# Patient Record
Sex: Female | Born: 1995 | Race: White | Hispanic: No | Marital: Single | Smoking: Former smoker
Health system: Southern US, Community
[De-identification: ages and names within clinical notes are randomized; demographics above are authoritative.]

## PROBLEM LIST (undated history)

## (undated) ENCOUNTER — Inpatient Hospital Stay (HOSPITAL_COMMUNITY): Payer: Self-pay

## (undated) ENCOUNTER — Emergency Department (HOSPITAL_COMMUNITY): Admission: EM | Payer: Self-pay

## (undated) DIAGNOSIS — Z789 Other specified health status: Secondary | ICD-10-CM

## (undated) DIAGNOSIS — Q2112 Patent foramen ovale: Secondary | ICD-10-CM

## (undated) DIAGNOSIS — E538 Deficiency of other specified B group vitamins: Secondary | ICD-10-CM

## (undated) DIAGNOSIS — O139 Gestational [pregnancy-induced] hypertension without significant proteinuria, unspecified trimester: Secondary | ICD-10-CM

## (undated) DIAGNOSIS — R51 Headache: Secondary | ICD-10-CM

## (undated) DIAGNOSIS — R519 Headache, unspecified: Secondary | ICD-10-CM

## (undated) HISTORY — DX: Patent foramen ovale: Q21.12

## (undated) HISTORY — DX: Deficiency of other specified B group vitamins: E53.8

## (undated) HISTORY — DX: Gestational (pregnancy-induced) hypertension without significant proteinuria, unspecified trimester: O13.9

---

## 2013-02-03 ENCOUNTER — Emergency Department (HOSPITAL_BASED_OUTPATIENT_CLINIC_OR_DEPARTMENT_OTHER)
Admission: EM | Admit: 2013-02-03 | Discharge: 2013-02-03 | Disposition: A | Discharge status: Home / Self Care | Payer: Medicaid Other | Attending: Emergency Medicine | Admitting: Emergency Medicine

## 2013-02-03 ENCOUNTER — Encounter (HOSPITAL_BASED_OUTPATIENT_CLINIC_OR_DEPARTMENT_OTHER): Payer: Self-pay | Admitting: Emergency Medicine

## 2013-02-03 DIAGNOSIS — Z79899 Other long term (current) drug therapy: Secondary | ICD-10-CM | POA: Insufficient documentation

## 2013-02-03 DIAGNOSIS — Z3202 Encounter for pregnancy test, result negative: Secondary | ICD-10-CM | POA: Insufficient documentation

## 2013-02-03 DIAGNOSIS — O9989 Other specified diseases and conditions complicating pregnancy, childbirth and the puerperium: Secondary | ICD-10-CM | POA: Insufficient documentation

## 2013-02-03 DIAGNOSIS — Z3201 Encounter for pregnancy test, result positive: Secondary | ICD-10-CM

## 2013-02-03 DIAGNOSIS — R110 Nausea: Secondary | ICD-10-CM | POA: Insufficient documentation

## 2013-02-03 LAB — PREGNANCY, URINE: Preg Test, Ur: POSITIVE — AB

## 2013-02-03 MED ORDER — FOLIC ACID 1 MG PO TABS: 1.0000 mg | ORAL_TABLET | Freq: Every day | ORAL | Status: DC

## 2013-02-03 MED ORDER — PRENATAL VITAMINS 0.8 MG PO TABS: 1.0000 | ORAL_TABLET | Freq: Every day | ORAL | Status: DC

## 2013-02-03 NOTE — ED Notes (Signed)
Pt sts that sh is "going to get pregnancy medicaid and I need proof that I am pregnant.  I go in the morning."  Not having any problems.

## 2013-02-03 NOTE — Discharge Instructions (Signed)
RESOURCE GUIDE  Chronic Pain Problems: Contact [facility]  [phone] Patients need to be referred by their primary care doctor.  Insufficient Money for Medicine: Contact United Way:  call "211."   No Primary Care Doctor: - Call Health Connect  [phone] - can help you locate a primary care doctor that  accepts your insurance, provides certain services, etc. - Physician Referral Service- [phone]  Agencies that provide inexpensive medical care: - [facility]  [facility]  [phone] - [facility]  [phone] - [facility]  [phone] - Planned Parenthood  [facility]  [phone]  Silex Providers: - [facility]- [address]  [phone], Mon-Fri 9am-7pm, Sat 9am-1pm - [facility]- [place], [address] [place]  [place], [address] [place]  [place]- [address]  [place], [address], [phone]  Only accepts [place] patients after they have their name  applied to their card  Self Pay (no insurance) in [place]: - Sickle Cell Patients: Dr Kevan Ny, [facility]  [place], [facility]- [place]  [facility]- [id number] [place], [facility]- see information above (Speak to [employer] if you do not have insurance)       -  [facility]- [place],  [place], [place]  Dr Vista Lawman-  [address], [place], [place], [place]       -  Urgent Medical and [facility] - [address] [phone]       -  [facility]- [address], [place],  also [address], [phone]       -    [facility]- [place], [place], 1st & 3rd Saturday        every month, 10am-1pm  [facility] [place], [place] [address] [phone]  [facility] [place]. [place], [place] [address] [phone]  1) Find a Doctor and Pay Out of Pocket Although you won't have to find out who is covered by your insurance plan, it is a good idea to ask around and get recommendations. You will then need to call the office and see if the doctor you have chosen will accept you as a new patient and what types of options they offer for patients who are self-pay. Some doctors offer discounts or will set up payment plans for their patients who do not have insurance, but you will need to ask so you aren't surprised when you get to your appointment.  2) Contact Your Local Health Department Not all health departments have doctors that can see patients for sick visits, but many do, so it is worth a call to see if yours does. If you don't know where your local health department is, you can check in your phone book. The CDC also has a tool  to help you locate your state's health department, and many state websites also have listings of all of their local health departments.  3) Find a Walk-in Clinic If your illness is not likely to be very severe or complicated, you may want to try a walk in clinic. These are popping up all over the country in pharmacies, drugstores, and shopping centers. They're usually staffed by nurse practitioners or physician assistants that have been trained to treat common illnesses and complaints. They're usually fairly quick and inexpensive. However, if you have serious medical issues or chronic medical problems, these are probably not your best option  STD Testing - [facility] Department of [facility] [place], STD Clinic, [address], [place],  phone [phone] or [phone].  Monday - Friday, call for an appointment. [facility] Department of [employer], STD Clinic, [place] [address], [place], phone [phone] or [phone].  Monday - Friday, call for an appointment.  Abuse/Neglect: [facility] Child Abuse Hotline [phone] [facility] Child Abuse Hotline [phone] (After Hours)  Emergency Shelter:  [facility] [phone]  Maternity Homes: - Room at the [place] [facility] [phone] - [facility] [phone]  MRSA Hotline #:   505-452-1969  Dental Assistance If unable to pay or uninsured, contact:  [facility]. to become qualified for the adult dental clinic.  Patients with Medicaid: [facility] [phone] [address], [phone] [address] [phone]  If unable to pay, or uninsured, contact [facility] [phone] in [place], [phone] in [facility]) to become qualified for the adult dental clinic  [facility] [address] [place], [place] [address] [phone] [email or web address]  Other Proofreader Services: - [facility]- [address] [place], [place], [place], [address], [phone], Ext. [phone], 2nd and 4th Thursday of the month at 6:30am.  10 clients each day by appointment, can sometimes see walk-in patients if someone does not show for an appointment. [facility]- [address] [place], [place], [address], [phone] - [facility]- [address] [place], [place], [address], [phone] [facility] Health Department- [phone] [facility] Health Department- [phone] [facility] [phone]          Prenatal Care  WHAT IS PRENATAL CARE?  Prenatal care means health care during your pregnancy, before your baby is born. It is very important to take care of yourself and your baby  during your pregnancy by:   Getting early prenatal care. If you know you are pregnant, or think you might be pregnant, call your health care provider as soon as possible. Schedule a visit for a prenatal exam.  Getting regular prenatal care. Follow your health care provider's schedule for blood and other necessary tests. Do not miss appointments.  Doing everything you can to keep yourself and your baby healthy during your pregnancy.  Getting complete care. Prenatal care should include evaluation of the medical, dietary, educational, psychological, and social needs of you and your significant other. The medical and genetic history of your family and the family of your baby's father should be discussed with your health care provider.  Discussing with your health care provider:  Prescription, over-the-counter, and herbal medicines that you take.  Any history of substance abuse, alcohol use, smoking, and illegal drug use.  Any history of domestic abuse and violence.  Immunizations you have received.  Your nutrition and diet.  The amount of exercise you do.  Any environmental and occupational hazards to which you are exposed.  History of sexually transmitted infections for both you and your partner.  Previous pregnancies you have had. WHY IS PRENATAL CARE SO IMPORTANT?  By regularly seeing your health care provider, you help ensure that problems can be identified early so that they can be treated as soon as possible. Other problems might be prevented. Many studies have shown that early and regular prenatal care is important for the health of mothers and their babies.  HOW CAN I TAKE CARE OF MYSELF WHILE I AM PREGNANT?  Here are ways to take care of yourself and your baby:   Start or continue taking your multivitamin with 400 micrograms (mcg) of folic acid every day.  Get early and regular prenatal care. It is very important to see a health care provider during your pregnancy. Your  health care provider will check at each visit to make sure that you and the baby are healthy. If there are any problems, action can be taken right away to help you and the baby.  Eat a healthy diet that includes:  Fruits.  Vegetables.  Foods low in saturated fat.  Whole grains.  Calcium-rich foods, such as milk, yogurt, and hard cheeses.  Drink 6 to 8 glasses of liquids a day.  Unless your health care provider tells you not to, try to be physically active for 30 minutes, most days of the week. If you are pressed for time, you can get your activity in through 10-minute segments, three times a day.  Do not smoke, drink alcohol, or use drugs. These can cause long-term damage to your baby. Talk with your health care provider about steps to take to stop smoking. Talk with a member of your faith community, a counselor, a trusted friend, or your health care provider if you are concerned about your alcohol or drug use.  Ask your health care provider before taking any medicine, even over-the-counter medicines. Some medicines are not safe to take during pregnancy.  Get plenty of rest and sleep.  Avoid hot tubs and saunas during pregnancy.  Do not have X-rays taken unless absolutely necessary and with the recommendation of your health care provider. A lead shield can be placed on your abdomen to protect the baby when X-rays are taken in other parts of the body.  Do not empty the cat litter when you are pregnant. It may contain a parasite that causes an infection called toxoplasmosis, which can cause birth defects. Also, use gloves when working in garden areas used by cats.  Do not eat uncooked or undercooked meats or fish.  Do not eat soft, mold-ripened cheeses (Brie, Camembert, and chevre) or soft, blue-veined cheese (Danish blue and Roquefort).  Stay away from toxic chemicals like:  Insecticides.  Solvents (some cleaners or paint thinners).  Lead.  Mercury.  Sexual intercourse may  continue until the end of the pregnancy, unless you have a medical problem or there is a problem with the pregnancy and your health care provider tells you not to.  Do not wear high-heel shoes, especially during the second half of the pregnancy. You can lose your balance and fall.  Do not take long trips, unless absolutely necessary. Be sure to see your health care provider before going on the trip.  Do not sit in one position for more than 2 hours when on a trip.  Take a copy of  your medical records when going on a trip. Know where a hospital is located in the city you are visiting, in case of an emergency.  Most dangerous household products will have pregnancy warnings on their labels. Ask your health care provider about products if you are unsure.  Limit or eliminate your caffeine intake from coffee, tea, sodas, medicines, and chocolate.  Many women continue working through pregnancy. Staying active might help you stay healthier. If you have a question about the safety or the hours you work at your particular job, talk with your health care provider.  Get informed:  Read books.  Watch videos.  Go to childbirth classes for you and your significant other.  Talk with experienced moms.  Ask your health care provider about childbirth education classes for you and your partner. Classes can help you and your partner prepare for the birth of your baby.  Ask about a baby doctor (pediatrician) and methods and pain medicine for labor, delivery, and possible cesarean delivery. HOW OFTEN SHOULD I SEE MY HEALTH CARE PROVIDER DURING PREGNANCY?  Your health care provider will give you a schedule for your prenatal visits. You will have visits more often as you get closer to the end of your pregnancy. An average pregnancy lasts about 40 weeks.  A typical schedule includes visiting your health care provider:   About once each month during your first 6 months of pregnancy.  Every 2 weeks during the  next 2 months.  Weekly in the last month, until the delivery date. Your health care provider will probably want to see you more often if:  You are older than 35 years.  Your pregnancy is high risk because you have certain health problems or problems with the pregnancy, such as:  Diabetes.  High blood pressure.  The baby is not growing on schedule, according to the dates of the pregnancy. Your health care provider will do special tests to make sure you and the baby are not having any serious problems. WHAT HAPPENS DURING PRENATAL VISITS?   At your first prenatal visit, your health care provider will do a physical exam and talk to you about your health history and the health history of your partner and your family. Your health care provider will be able to tell you what date to expect your baby to be born on.  Your first physical exam will include checks of your blood pressure, measurements of your height and weight, and an exam of your pelvic organs. Your health care provider will do a Pap test if you have not had one recently and will do cultures of your cervix to make sure there is no infection.  At each prenatal visit, there will be tests of your blood, urine, blood pressure, weight, and checking the progress of the baby.  At your later prenatal visits, your health care provider will check how you are doing and how the baby is developing. You may have a number of tests done as your pregnancy progresses.  Ultrasound exams are often used to check on the baby's growth and health.  You may have more urine and blood tests, as well as special tests, if needed. These may include amniocentesis to examine fluid in the pregnancy sac, stress tests to check how the baby responds to contractions, or a biophysical profile to measure fetus well-being. Your health care provider will explain the tests and why they are necessary.  You should discuss with your health care provider your plans to  breastfeed or bottle-feed your baby.  Each visit is also a chance for you to learn about staying healthy during pregnancy and to ask questions. Document Released: 01/10/2003 Document Revised: 10/28/2012 Document Reviewed: 06/25/2012 [facility] Patient Information 2014 [place], [place]. Pregnancy If you are planning on getting pregnant, it is a good idea to make a preconception appointment with your caregiver to discuss having a healthy lifestyle before getting pregnant. This includes diet, weight, exercise, taking prenatal vitamins (especially folic acid, which helps prevent brain and spinal cord defects), avoiding alcohol, smoking and illegal drugs, medical problems (diabetes, convulsions), family history of genetic problems, working conditions, and immunizations. It is better to have knowledge of these things and do something about them before getting pregnant. During your pregnancy, it is important to follow certain guidelines in order to have a healthy baby. It is very important to get good prenatal care and follow your caregiver's instructions. Prenatal care includes all the medical care you receive before your baby's birth. This helps to prevent problems during the pregnancy and childbirth. HOME CARE INSTRUCTIONS  Start your prenatal visits by the 12th week of pregnancy or earlier, if possible. At first, appointments are usually scheduled monthly. They become more frequent in the last 2 months before delivery. It is important that you keep your caregiver's appointments and follow your caregiver's instructions regarding medication use, exercise, and diet. During pregnancy, you are providing food for you and your baby. Eat a regular, well-balanced diet. Choose foods such as meat, fish, milk and other dairy products, vegetables, fruits, whole-grain breads and cereals. Your caregiver will inform you of the ideal weight gain depending on your current height and weight. Drink lots of liquids. Try to drink 8  glasses of water a day. Alcohol is associated with a number of birth defects including fetal alcohol syndrome. It is best to avoid alcohol completely. Smoking will cause low birth rate and prematurity. Use of alcohol and nicotine during your pregnancy also increases the chances that your child will be chemically dependent later in their life and may contribute to SIDS (Sudden Infant Death Syndrome). Do not use illegal drugs. Only take prescription or over-the-counter medications that are recommended by your caregiver. Other medications can cause genetic and physical problems in the baby. Morning sickness can often be helped by keeping soda crackers at the bedside. Eat a few before getting up in the morning. A sexual relationship may be continued until near the end of pregnancy if there are no other problems such as early (premature) leaking of amniotic fluid from the membranes, vaginal bleeding, painful intercourse or belly (abdominal) pain. Exercise regularly. Check with your caregiver if you are unsure of the safety of some of your exercises. Do not use hot tubs, steam rooms or saunas. These increase the risk of fainting and hurting yourself and the baby. Swimming is OK for exercise. Get plenty of rest, including afternoon naps when possible, especially in the third trimester. Avoid toxic odors and chemicals. Do not wear high heels. They may cause you to lose your balance and fall. Do not lift over 5 pounds. If you do lift anything, lift with your legs and thighs, not your back. Avoid long trips, especially in the third trimester. If you have to travel out of the city or state, take a copy of your medical records with you. SEEK IMMEDIATE MEDICAL CARE IF:  You develop an unexplained oral temperature above 102 F (38.9 C), or as your caregiver suggests. You have leaking of fluid from  the vagina. If leaking membranes are suspected, take your temperature and inform your caregiver of this when you  call. There is vaginal spotting or bleeding. Notify your caregiver of the amount and how many pads are used. You continue to feel sick to your stomach (nauseous) and have no relief from remedies suggested, or you throw up (vomit) blood or coffee ground like materials. You develop upper abdominal pain. You have round ligament discomfort in the lower abdominal area. This still must be evaluated by your caregiver. You feel contractions of the uterus. You do not feel the baby move, or there is less movement than before. You have painful urination. You have abnormal vaginal discharge. You have persistent diarrhea. You get a severe headache. You have problems with your vision. You develop muscle weakness. You feel dizzy and faint. You develop shortness of breath. You develop chest pain. You have back pain that travels down to your leg and feet. You feel irregular or a very fast heartbeat. You develop excessive weight gain in a short period of time (5 pounds in 3 to 5 days). You are involved in a domestic violence situation. Document Released: 01/07/2005 Document Revised: 07/09/2011 Document Reviewed: 07/01/2008 [facility] Patient Information 2014 [employer].

## 2013-02-03 NOTE — ED Provider Notes (Signed)
CSN: [id number]     Arrival date & time 02/03/13  1411 History   First MD Initiated Contact with Patient 02/03/13 1546     Chief Complaint  Patient presents with  . Possible Pregnancy   (Consider location/radiation/quality/duration/timing/severity/associated sxs/prior Treatment) HPI Comments: Pt comes in to confirm pregnancy status, mainly since she needs hospital evidence to get medicaid. MNP was in October. Pt has no abd pain, uri like sx, vaginal discharge or bleeding. She has not been pregnant in the past.  Patient is a 18 y.o. female presenting with pregnancy problem. The history is provided by the patient.  Possible Pregnancy Patient reports no abdominal pain, no vaginal bleeding and no vaginal discharge.  Associated symptoms include nausea.  Associated symptoms include no dysuria and no vomiting.    History reviewed. No pertinent past medical history. History reviewed. No pertinent past surgical history. No family history on file. History  Substance Use Topics  . Smoking status: Never Smoker   . Smokeless tobacco: Not on file  . Alcohol Use: No   OB History   Grav Para Term Preterm Abortions TAB SAB Ect Mult Living                 Review of Systems  Constitutional: Negative for activity change.  Gastrointestinal: Positive for nausea. Negative for vomiting and abdominal pain.  Genitourinary: Negative for dysuria, vaginal bleeding and vaginal discharge.  Neurological: Negative for dizziness.    Allergies  Sulfa antibiotics  Home Medications   Current Outpatient Rx  Name  Route  Sig  Dispense  Refill  . folic acid (FOLVITE) 1 MG tablet   Oral   Take 1 tablet (1 mg total) by mouth daily.   90 tablet   3   . Prenatal Multivit-Min-Fe-FA (PRENATAL VITAMINS) 0.8 MG tablet   Oral   Take 1 tablet by mouth daily.   90 tablet   3    BP 117/60  Pulse 71  Temp(Src) 98.3 F (36.8 C) (Oral)  Resp 18  Ht 5\' 2"  (1.575 m)  Wt 140 lb (63.504 kg)  BMI 25.60 kg/m2   SpO2 100%  LMP 10/21/2012 Physical Exam  Nursing note and vitals reviewed. Constitutional: She appears well-developed.  Eyes: Conjunctivae are normal.  Neck: Neck supple.  Cardiovascular: Normal rate.   Pulmonary/Chest: Effort normal.  Abdominal: She exhibits no distension. There is no tenderness. There is no rebound and no guarding.    ED Course  Procedures (including critical care time) Labs Review Labs Reviewed  PREGNANCY, URINE - Abnormal; Notable for the following:    Preg Test, Ur POSITIVE (*)    All other components within normal limits   Imaging Review No results found.  EKG Interpretation   None       MDM   1. Positive urine pregnancy test    Pt comes in for pregnancy check up. UCG is + No abd pain, vaginal bleeding. No need for further ED workup. Directed to Health department.  Derwood KaplanAnkit Maymie Brunke, MD 02/03/13 1610

## 2013-02-09 ENCOUNTER — Emergency Department (HOSPITAL_COMMUNITY): Payer: Medicaid Other

## 2013-02-09 ENCOUNTER — Emergency Department (HOSPITAL_COMMUNITY)
Admission: EM | Admit: 2013-02-09 | Discharge: 2013-02-09 | Disposition: A | Discharge status: Home / Self Care | Payer: Medicaid Other | Attending: Emergency Medicine | Admitting: Emergency Medicine

## 2013-02-09 ENCOUNTER — Encounter (HOSPITAL_COMMUNITY): Payer: Self-pay | Admitting: Emergency Medicine

## 2013-02-09 DIAGNOSIS — O9989 Other specified diseases and conditions complicating pregnancy, childbirth and the puerperium: Secondary | ICD-10-CM | POA: Insufficient documentation

## 2013-02-09 DIAGNOSIS — N912 Amenorrhea, unspecified: Secondary | ICD-10-CM | POA: Insufficient documentation

## 2013-02-09 DIAGNOSIS — O219 Vomiting of pregnancy, unspecified: Secondary | ICD-10-CM

## 2013-02-09 DIAGNOSIS — R61 Generalized hyperhidrosis: Secondary | ICD-10-CM | POA: Insufficient documentation

## 2013-02-09 DIAGNOSIS — R231 Pallor: Secondary | ICD-10-CM | POA: Insufficient documentation

## 2013-02-09 DIAGNOSIS — R1032 Left lower quadrant pain: Secondary | ICD-10-CM | POA: Insufficient documentation

## 2013-02-09 DIAGNOSIS — O210 Mild hyperemesis gravidarum: Secondary | ICD-10-CM | POA: Insufficient documentation

## 2013-02-09 DIAGNOSIS — Z3490 Encounter for supervision of normal pregnancy, unspecified, unspecified trimester: Secondary | ICD-10-CM

## 2013-02-09 DIAGNOSIS — R42 Dizziness and giddiness: Secondary | ICD-10-CM | POA: Insufficient documentation

## 2013-02-09 LAB — URINALYSIS, ROUTINE W REFLEX MICROSCOPIC
BILIRUBIN URINE: NEGATIVE
Glucose, UA: NEGATIVE mg/dL
HGB URINE DIPSTICK: NEGATIVE
KETONES UR: NEGATIVE mg/dL
NITRITE: NEGATIVE
PH: 6 (ref 5.0–8.0)
Protein, ur: NEGATIVE mg/dL
Specific Gravity, Urine: 1.031 — ABNORMAL HIGH (ref 1.005–1.030)
Urobilinogen, UA: 0.2 mg/dL (ref 0.0–1.0)

## 2013-02-09 LAB — CBC
HEMATOCRIT: 34.7 % — AB (ref 36.0–46.0)
HEMOGLOBIN: 11.9 g/dL — AB (ref 12.0–15.0)
MCH: 30.4 pg (ref 26.0–34.0)
MCHC: 34.3 g/dL (ref 30.0–36.0)
MCV: 88.7 fL (ref 78.0–100.0)
Platelets: 250 10*3/uL (ref 150–400)
RBC: 3.91 MIL/uL (ref 3.87–5.11)
RDW: 12.3 % (ref 11.5–15.5)
WBC: 6.4 10*3/uL (ref 4.0–10.5)

## 2013-02-09 LAB — BASIC METABOLIC PANEL
BUN: 9 mg/dL (ref 6–23)
CO2: 21 mEq/L (ref 19–32)
CREATININE: 0.64 mg/dL (ref 0.50–1.10)
Calcium: 9.3 mg/dL (ref 8.4–10.5)
Chloride: 103 mEq/L (ref 96–112)
GFR calc Af Amer: >90 mL/min (ref >90)
GFR calc non Af Amer: >90 mL/min (ref >90)
GLUCOSE: 101 mg/dL — AB (ref 70–99)
Potassium: 4.1 mEq/L (ref 3.7–5.3)
Sodium: 137 mEq/L (ref 137–147)

## 2013-02-09 LAB — URINE MICROSCOPIC-ADD ON

## 2013-02-09 LAB — WET PREP, GENITAL
TRICH WET PREP: NONE SEEN
YEAST WET PREP: NONE SEEN

## 2013-02-09 LAB — POCT PREGNANCY, URINE: Preg Test, Ur: POSITIVE — AB

## 2013-02-09 LAB — HCG, QUANTITATIVE, PREGNANCY: HCG, BETA CHAIN, QUANT, S: 26374 m[IU]/mL — AB (ref <5)

## 2013-02-09 MED ORDER — ONDANSETRON HCL 4 MG/2ML IJ SOLN
4.0000 mg | Freq: Once | INTRAMUSCULAR | Status: AC
  Administered 2013-02-09: 4 mg via INTRAVENOUS
  Filled 2013-02-09: qty 2

## 2013-02-09 MED ORDER — SODIUM CHLORIDE 0.9 % IV BOLUS (SEPSIS)
1000.0000 mL | Freq: Once | INTRAVENOUS | Status: AC
  Administered 2013-02-09: 1000 mL via INTRAVENOUS

## 2013-02-09 MED ORDER — ONDANSETRON 4 MG PO TBDP: 4.0000 mg | ORAL_TABLET | Freq: Three times a day (TID) | ORAL | Status: DC | PRN

## 2013-02-09 NOTE — ED Provider Notes (Signed)
Medical screening examination/treatment/procedure(s) were performed by non-physician practitioner and as supervising physician I was immediately available for consultation/collaboration.  EKG Interpretation    Date/Time:  Tuesday February 09 2013 10:13:47 EST Ventricular Rate:  77 PR Interval:  126 QRS Duration: 96 QT Interval:  368 QTC Calculation: 416 R Axis:   84 Text Interpretation:  Normal sinus rhythm with sinus arrhythmia RSR prime Borderline ECG Confirmed by Drey Shaff  DO, Lolitha Tortora ([id number]) on 02/09/2013 12:22:43 PM              Layla MawKristen N Brittanny Levenhagen, DO 02/09/13 1936

## 2013-02-09 NOTE — ED Notes (Signed)
Pt had two episodes of syncope yesterday.  Pt has been vomiting continuously since October.  Pt has not had menstrual since September.  She had negative pregnancy tests in oct, nov, and dec.  Pt then had positive pregnancy test in Jan.  Pt is now having lower abdominal pain that shoots to left side.  G-1, P-0 A-0.  Pt concerned she has no idea how far along she is and concerned for ectopic pregnancy

## 2013-02-09 NOTE — ED Notes (Signed)
Pt reports constant N/V since September with intermittent HA's. States she has been seen multiple times, but has no dx but continuation of symptoms. Pt also reports LMP beginning of September, states they are usually regular. First positive pregnancy test in January. Now reports lower quadrant pain, denies bleeding or discharge. Pain on palpation. Denies urinary sym, diarrhea.

## 2013-02-09 NOTE — ED Provider Notes (Signed)
CSN: [id number]     Arrival date & time 02/09/13  1005 History   First MD Initiated Contact with Patient 02/09/13 1108     Chief Complaint  Patient presents with  . Loss of Consciousness  . Emesis  . Abdominal Pain   (Consider location/radiation/quality/duration/timing/severity/associated sxs/prior Treatment) HPI Comments: Patient who is G1 P0 with history of intermittent nausea and vomiting for the past 4 months, amenorrhea for 4 months -- presents with positive pregnancy test, persistent vomiting, and lower abdominal and left lower quadrant pain for the past 2 days. Patient states that yesterday she had 2 episodes of near-syncope. The first was associated with lightheadedness, warm feeling, sweating, pallor after she stood up from resting. Patient felt better with time and went out to the grocery store when she had another similar episode. She currently denies any vaginal bleeding or vaginal discharge. She denies  fever, URI symptoms, chest pain, shortness of breath, back pain, constipation, diarrhea, urinary symptoms. Patient searched for her symptoms on the Internet and she is concerned of ectopic pregnancy. Course is intermittent. Onset acute. Nothing makes symptoms better or worse. No treatments prior to arrival.  The history is provided by the patient.    History reviewed. No pertinent past medical history. History reviewed. No pertinent past surgical history. No family history on file. History  Substance Use Topics  . Smoking status: Never Smoker   . Smokeless tobacco: Not on file  . Alcohol Use: No   OB History   Grav Para Term Preterm Abortions TAB SAB Ect Mult Living                 Review of Systems  Constitutional: Negative for fever.  HENT: Negative for rhinorrhea and sore throat.   Eyes: Negative for redness.  Respiratory: Negative for cough.   Cardiovascular: Negative for chest pain.  Gastrointestinal: Positive for nausea, vomiting and abdominal pain. Negative for  diarrhea.  Genitourinary: Positive for menstrual problem. Negative for dysuria, vaginal bleeding, vaginal discharge and vaginal pain.  Musculoskeletal: Negative for myalgias.  Skin: Negative for rash.  Neurological: Positive for light-headedness. Negative for headaches.    Allergies  Sulfa antibiotics  Home Medications   Current Outpatient Rx  Name  Route  Sig  Dispense  Refill  . folic acid (FOLVITE) 1 MG tablet   Oral   Take 1 tablet (1 mg total) by mouth daily.   90 tablet   3   . Prenatal Multivit-Min-Fe-FA (PRENATAL VITAMINS) 0.8 MG tablet   Oral   Take 1 tablet by mouth daily.   90 tablet   3    BP 126/77  Pulse 69  Temp(Src) 98.1 F (36.7 C) (Oral)  Resp 16  SpO2 98%  LMP 10/21/2012 Physical Exam  Nursing note and vitals reviewed. Constitutional: She appears well-developed and well-nourished.  HENT:  Head: Normocephalic and atraumatic.  Eyes: Conjunctivae are normal. Right eye exhibits no discharge. Left eye exhibits no discharge.  Neck: Normal range of motion. Neck supple.  Cardiovascular: Normal rate, regular rhythm and normal heart sounds.   Pulmonary/Chest: Effort normal and breath sounds normal.  Abdominal: Soft. There is no tenderness.  Genitourinary: There is no rash on the right labia. There is no rash on the left labia. Uterus is not tender. Cervix exhibits friability. Cervix exhibits no motion tenderness and no discharge. Right adnexum displays no mass, no tenderness and no fullness. Left adnexum displays no mass, no tenderness and no fullness. No erythema, tenderness or bleeding around the  vagina. No vaginal discharge found.  Neurological: She is alert.  Skin: Skin is warm and dry.  Psychiatric: She has a normal mood and affect.    ED Course  Procedures (including critical care time) Labs Review Labs Reviewed  WET PREP, GENITAL - Abnormal; Notable for the following:    Clue Cells Wet Prep HPF POC MODERATE (*)    WBC, Wet Prep HPF POC MANY (*)     All other components within normal limits  CBC - Abnormal; Notable for the following:    Hemoglobin 11.9 (*)    HCT 34.7 (*)    All other components within normal limits  BASIC METABOLIC PANEL - Abnormal; Notable for the following:    Glucose, Bld 101 (*)    All other components within normal limits  URINALYSIS, ROUTINE W REFLEX MICROSCOPIC - Abnormal; Notable for the following:    APPearance HAZY (*)    Specific Gravity, Urine 1.031 (*)    Leukocytes, UA TRACE (*)    All other components within normal limits  URINE MICROSCOPIC-ADD ON - Abnormal; Notable for the following:    Squamous Epithelial / LPF FEW (*)    Bacteria, UA FEW (*)    All other components within normal limits  POCT PREGNANCY, URINE - Abnormal; Notable for the following:    Preg Test, Ur POSITIVE (*)    All other components within normal limits  GC/CHLAMYDIA PROBE AMP  HCG, QUANTITATIVE, PREGNANCY   Imaging Review [place] Ob Comp Less 14 Wks  02/09/2013   CLINICAL DATA:  Positive pregnancy test and left-sided pain.  EXAM: TRANSVAGINAL OB ULTRASOUND; OBSTETRIC <14 WK ULTRASOUND  TECHNIQUE: Transvaginal ultrasound was performed for complete evaluation of the gestation as well as the maternal uterus, adnexal regions, and pelvic cul-de-sac.  COMPARISON:  None.  FINDINGS: Intrauterine gestational sac: Visualized/normal in shape.  Yolk sac:  Visualized  Embryo:  Visualized  Cardiac Activity: Visualized  Heart Rate: 119 bpm  CRL:   4.1  mm   6 w 1d                  US EDC: 10/04/2013  Maternal uterus/adnexae: No subchorionic hemorrhage is visualized. Both the right and left maternal ovaries have normal appearances. The left ovary measures 3.1 x 2.1 x 2.7 cm. The right ovary measures 2.7 x 1.4 x 2.5 cm. No adnexal mass is seen. No free pelvic fluid.  IMPRESSION: Single living intrauterine pregnancy. Estimated gestational age by crown rump length is 6 weeks 1 day with an EDC of 10/04/2013.  Ultrasound for fetal anatomic evaluation at  18-[redacted] weeks gestational age is recommended.   Electronically Signed   By: Britta MccreedySusan  Turner M.D.   On: 02/09/2013 14:02   [place] Ob Transvaginal  02/09/2013   CLINICAL DATA:  Positive pregnancy test and left-sided pain.  EXAM: TRANSVAGINAL OB ULTRASOUND; OBSTETRIC <14 WK ULTRASOUND  TECHNIQUE: Transvaginal ultrasound was performed for complete evaluation of the gestation as well as the maternal uterus, adnexal regions, and pelvic cul-de-sac.  COMPARISON:  None.  FINDINGS: Intrauterine gestational sac: Visualized/normal in shape.  Yolk sac:  Visualized  Embryo:  Visualized  Cardiac Activity: Visualized  Heart Rate: 119 bpm  CRL:   4.1  mm   6 w 1d                  US EDC: 10/04/2013  Maternal uterus/adnexae: No subchorionic hemorrhage is visualized. Both the right and left maternal ovaries have normal appearances. The left ovary measures  3.1 x 2.1 x 2.7 cm. The right ovary measures 2.7 x 1.4 x 2.5 cm. No adnexal mass is seen. No free pelvic fluid.  IMPRESSION: Single living intrauterine pregnancy. Estimated gestational age by crown rump length is 6 weeks 1 day with an EDC of 10/04/2013.  Ultrasound for fetal anatomic evaluation at 18-[redacted] weeks gestational age is recommended.   Electronically Signed   By: Britta Mccreedy M.D.   On: 02/09/2013 14:02    EKG Interpretation    Date/Time:  Tuesday February 09 2013 10:13:47 EST Ventricular Rate:  77 PR Interval:  126 QRS Duration: 96 QT Interval:  368 QTC Calculation: 416 R Axis:   84 Text Interpretation:  Normal sinus rhythm with sinus arrhythmia RSR prime Borderline ECG Confirmed by WARD  DO, KRISTEN ([id number]) on 02/09/2013 12:22:43 PM           11:30 AM Patient seen and examined. Work-up initiated. Medications ordered.   Vital signs reviewed and are as follows: Filed Vitals:   02/09/13 1125  BP: 126/77  Pulse: 69  Temp:   Resp: 16   12:46 PM Pelvic performed with nurse tech chaperone.    [place] confirms IUP. Pt informed. She needed one additional dose of  zofran for nausea but tolerated PO's afterwards and has not vomited in ED. She appears well. She has plans for OB f/u.   Patient urged to return with worsening symptoms or other concerns. Patient verbalized understanding and agrees with plan.      MDM   1. Pregnancy   2. Vomiting complicating pregnancy   3. Lightheadedness    Pregnancy explains vomiting and amenorrhea. IVP confirmed. Suspect lightheadedness/near syncope likely related as well. Fluids and symptomatic treatment given in ED with good results. Patient appears well. Will discharge to home.   Renne Crigler, PA-C 02/09/13 1655

## 2013-02-09 NOTE — Discharge Instructions (Signed)
Please read and follow all provided instructions.  Your diagnoses today include:  1. Pregnancy   2. Vomiting complicating pregnancy   3. Lightheadedness     Tests performed today include:  Vital signs. See below for your results today.   Ultrasound - shows pregnancy in the uterus at 6 weeks and 1 day  Blood counts and electrolytes - no concerning findings  Urine test - no infection  Medications prescribed:   Zofran (ondansetron) - for nausea and vomiting  Take any prescribed medications only as directed.  Home care instructions:  Follow any educational materials contained in this packet.  BE VERY CAREFUL not to take multiple medicines containing Tylenol (also called acetaminophen). Doing so can lead to an overdose which can damage your liver and cause liver failure and possibly death.   Follow-up instructions: Please follow-up with your primary care provider in the next 3 days for further evaluation of your symptoms. If you do not have a primary care doctor -- see below for referral information.   Return instructions:   Please return to the Emergency Department if you experience worsening symptoms.   Return with worsening abdominal pain, vaginal bleeding or discharge  Please return if you have any other emergent concerns.  Additional Information:  Your vital signs today were: BP 106/52   Pulse 57   Temp(Src) 98.1 F (36.7 C) (Oral)   Resp 16   SpO2 99%   LMP 10/21/2012 If your blood pressure (BP) was elevated above 135/85 this visit, please have this repeated by your doctor within one month. --------------

## 2013-02-09 NOTE — ED Notes (Signed)
Patient's back from  Ultrasound

## 2013-02-10 LAB — GC/CHLAMYDIA PROBE AMP
CT PROBE, AMP APTIMA: NEGATIVE
GC Probe RNA: NEGATIVE

## 2013-04-09 ENCOUNTER — Inpatient Hospital Stay (HOSPITAL_COMMUNITY)
Admission: AD | Admit: 2013-04-09 | Discharge: 2013-04-09 | Disposition: A | Discharge status: Home / Self Care | Payer: Medicaid Other | Source: Ambulatory Visit | Attending: Obstetrics & Gynecology | Admitting: Obstetrics & Gynecology

## 2013-04-09 ENCOUNTER — Encounter (HOSPITAL_COMMUNITY): Payer: Self-pay

## 2013-04-09 DIAGNOSIS — N898 Other specified noninflammatory disorders of vagina: Secondary | ICD-10-CM | POA: Insufficient documentation

## 2013-04-09 DIAGNOSIS — O209 Hemorrhage in early pregnancy, unspecified: Secondary | ICD-10-CM

## 2013-04-09 LAB — URINALYSIS, ROUTINE W REFLEX MICROSCOPIC
Bilirubin Urine: NEGATIVE
Glucose, UA: NEGATIVE mg/dL
KETONES UR: NEGATIVE mg/dL
Leukocytes, UA: NEGATIVE
NITRITE: NEGATIVE
PH: 7.5 (ref 5.0–8.0)
Protein, ur: NEGATIVE mg/dL
Specific Gravity, Urine: 1.02 (ref 1.005–1.030)
UROBILINOGEN UA: 0.2 mg/dL (ref 0.0–1.0)

## 2013-04-09 LAB — URINE MICROSCOPIC-ADD ON

## 2013-04-09 LAB — WET PREP, GENITAL
CLUE CELLS WET PREP: NONE SEEN
TRICH WET PREP: NONE SEEN
Yeast Wet Prep HPF POC: NONE SEEN

## 2013-04-09 NOTE — MAU Note (Signed)
Patient states she had bleeding last night after intercourse and continues to have spotting this am. No pain. [facility]

## 2013-04-09 NOTE — Discharge Instructions (Signed)
Vaginal Bleeding During Pregnancy, Second Trimester °A small amount of bleeding (spotting) from the vagina is relatively common in pregnancy. It usually stops on its own. Various things can cause bleeding or spotting in pregnancy. Some bleeding may be related to the pregnancy, and some may not. Sometimes the bleeding is normal and is not a problem. However, bleeding can also be a sign of something serious. Be sure to tell your health care provider about any vaginal bleeding right away. °Some possible causes of vaginal bleeding during the second trimester include: °· Infection, inflammation, or growths on the cervix.   °· The placenta may be partially or completely covering the opening of the cervix inside the uterus (placenta previa). °· The placenta may have separated from the uterus (abruption of the placenta).   °· You may be having early (preterm) labor.   °· The cervix may not be strong enough to keep a baby inside the uterus (cervical insufficiency).   °· Tiny cysts may have developed in the uterus instead of pregnancy tissue (molar pregnancy).  °HOME CARE INSTRUCTIONS  °Watch your condition for any changes. The following actions may help to lessen any discomfort you are feeling: °· Follow your health care provider's instructions for limiting your activity. If your health care provider orders bed rest, you may need to stay in bed and only get up to use the bathroom. However, your health care provider may allow you to continue light activity. °· If needed, make plans for someone to help with your regular activities and responsibilities while you are on bed rest. °· Keep track of the number of pads you use each day, how often you change pads, and how soaked (saturated) they are. Write this down. °· Do not use tampons. Do not douche. °· Do not have sexual intercourse or orgasms until approved by your health care provider. °· If you pass any tissue from your vagina, save the tissue so you can show it to your  health care provider. °· Only take over-the-counter or prescription medicines as directed by your health care provider. °· Do not take aspirin because it can make you bleed. °· Do not exercise or perform any strenuous activities or heavy lifting without your health care provider's permission. °· Keep all follow-up appointments as directed by your health care provider. °SEEK MEDICAL CARE IF: °· You have any vaginal bleeding during any part of your pregnancy. °· You have cramps or labor pains. °SEEK IMMEDIATE MEDICAL CARE IF:  °· You have severe cramps in your back or belly (abdomen). °· You have contractions. °· You have a fever, not controlled by medicine. °· You have chills. °· You pass large clots or tissue from your vagina. °· Your bleeding increases. °· You feel lightheaded or weak, or you have fainting episodes. °· You are leaking fluid or have a gush of fluid from your vagina. °MAKE SURE YOU: °· Understand these instructions. °· Will watch your condition. °· Will get help right away if you are not doing well or get worse. °Document Released: 10/17/2004 Document Revised: 10/28/2012 Document Reviewed: 09/14/2012 °ExitCare® Patient Information ©2014 [employer]. ° °

## 2013-04-09 NOTE — MAU Provider Note (Signed)
History     CSN: [id number]  Arrival date and time: 04/09/13 1153   First Provider Initiated Contact with Patient 04/09/13 1226      Chief Complaint  Patient presents with  . Vaginal Bleeding   HPI  Pt is a 18 yo G1P0 currently pregnant female presenting with one day of vaginal bleeding. Pt states that after intercourse last night she began to experience bright red vaginal bleeding. She denies any clots however saw something mucusy in the blood. This morning she saw some blood tinged urine, however noticed no frank blood. Pt has not experienced any bleeding during her pregnancy up to this point. Pt denies any recent abdominal pain however she has experienced strong cramping throughout her pregnancy. She has been evaluated in the ER for this in the past as well as for N/V. No hx of STI. Transvaginal US on 1/20 showed an IUP and no SCH.  Pt has not been seen for prenatal care up to this point because she is waiting for her medicaid. She does have a scheduled appointment for this coming Tuesday at [facility]. She states that she has not felt fetal movement at all throughout her pregnancy.   History reviewed. No pertinent past medical history.  History reviewed. No pertinent past surgical history.  History reviewed. No pertinent family history.  History  Substance Use Topics  . Smoking status: Never Smoker   . Smokeless tobacco: Not on file  . Alcohol Use: No    Allergies:  Allergies  Allergen Reactions  . Sulfa Antibiotics Rash    No prescriptions prior to admission    Review of Systems  Constitutional: Negative for fever and chills.  Respiratory: Negative for shortness of breath.   Cardiovascular: Negative for chest pain.  Gastrointestinal: Positive for nausea, vomiting (N/V throughout her pregnancy is her baseline, no increase) and abdominal pain (cramping throughout her pregnancy is her baseline, no increase).  Genitourinary: Positive for hematuria (pt experienced  frank blood last night after intercourse, minimal residual bleeding in the AM). Negative for dysuria, urgency, frequency and flank pain.  Neurological: Negative for dizziness.   Physical Exam   Blood pressure 108/68, pulse 80, temperature 97.9 F (36.6 C), temperature source Oral, resp. rate 16, height 5\' 2"  (1.575 m), weight 136 lb (61.689 kg), SpO2 100.00%.  Physical Exam  Constitutional: She is oriented to person, place, and time. She appears well-developed and well-nourished. No distress.  HENT:  Head: Normocephalic.  Cardiovascular: Normal rate, regular rhythm and normal heart sounds.   Respiratory: Effort normal and breath sounds normal.  GI: Soft. She exhibits no distension and no mass. There is no tenderness. There is no rebound and no guarding.  Genitourinary: Uterus is enlarged (appropriate for GA). Uterus is not tender. Cervix exhibits discharge (minimal blood noted in the vagina, otherwise no discharge). Cervix exhibits no motion tenderness. Right adnexum displays no tenderness. Left adnexum displays no tenderness. No vaginal discharge found.  OS closed  Neurological: She is alert and oriented to person, place, and time.  Skin: Skin is warm and dry. No erythema.  Psychiatric: She has a normal mood and affect.   Baseline Rate (A) 169 bpm filed at 04/09/2013 1222  MAU Course  Procedures  Results for orders placed during the hospital encounter of 04/09/13 (from the past 24 hour(s))  URINALYSIS, ROUTINE W REFLEX MICROSCOPIC     Status: Abnormal   Collection Time    04/09/13 12:05 PM      Result Value Ref Range  Color, Urine YELLOW  YELLOW   APPearance CLOUDY (*) CLEAR   Specific Gravity, Urine 1.020  1.005 - 1.030   pH 7.5  5.0 - 8.0   Glucose, UA NEGATIVE  NEGATIVE mg/dL   Hgb urine dipstick LARGE (*) NEGATIVE   Bilirubin Urine NEGATIVE  NEGATIVE   Ketones, ur NEGATIVE  NEGATIVE mg/dL   Protein, ur NEGATIVE  NEGATIVE mg/dL   Urobilinogen, UA 0.2  0.0 - 1.0 mg/dL    Nitrite NEGATIVE  NEGATIVE   Leukocytes, UA NEGATIVE  NEGATIVE  URINE MICROSCOPIC-ADD ON     Status: Abnormal   Collection Time    04/09/13 12:05 PM      Result Value Ref Range   Squamous Epithelial / LPF FEW (*) RARE   WBC, UA 0-2  <3 WBC/hpf   RBC / HPF 0-2  <3 RBC/hpf   Bacteria, UA FEW (*) RARE  WET PREP, GENITAL     Status: Abnormal   Collection Time    04/09/13 12:50 PM      Result Value Ref Range   Yeast Wet Prep HPF POC NONE SEEN  NONE SEEN   Trich, Wet Prep NONE SEEN  NONE SEEN   Clue Cells Wet Prep HPF POC NONE SEEN  NONE SEEN   WBC, Wet Prep HPF POC FEW (*) NONE SEEN    Assessment and Plan  A: #Vaginal bleeding: This is likely d/t intercourse.   P: Discharge home F/u with clinic on Tuesday for prenatal care. Pelvic rest advised Bleeding precautions discussed Patient may return to MAU as needed or if her condition were to change or worsen   I have seen and evaluated the patient with the PA student. I agree with the assessment and plan as written above.   Freddi StarrJulie N Ethier, PA-C 04/09/2013 1:45 PM

## 2013-04-10 LAB — GC/CHLAMYDIA PROBE AMP
CT PROBE, AMP APTIMA: NEGATIVE
GC Probe RNA: NEGATIVE

## 2013-04-13 ENCOUNTER — Encounter: Payer: Self-pay | Admitting: Obstetrics & Gynecology

## 2013-04-13 ENCOUNTER — Ambulatory Visit (INDEPENDENT_AMBULATORY_CARE_PROVIDER_SITE_OTHER): Payer: Medicaid Other | Admitting: Obstetrics & Gynecology

## 2013-04-13 VITALS — BP 99/67 | Temp 97.0°F | Wt 136.9 lb

## 2013-04-13 DIAGNOSIS — Z3490 Encounter for supervision of normal pregnancy, unspecified, unspecified trimester: Secondary | ICD-10-CM | POA: Insufficient documentation

## 2013-04-13 DIAGNOSIS — Z3480 Encounter for supervision of other normal pregnancy, unspecified trimester: Secondary | ICD-10-CM

## 2013-04-13 DIAGNOSIS — Z23 Encounter for immunization: Secondary | ICD-10-CM | POA: Insufficient documentation

## 2013-04-13 LAB — POCT URINALYSIS DIP (DEVICE)
Bilirubin Urine: NEGATIVE
Glucose, UA: NEGATIVE mg/dL
KETONES UR: NEGATIVE mg/dL
Nitrite: NEGATIVE
PROTEIN: NEGATIVE mg/dL
Urobilinogen, UA: 0.2 mg/dL (ref 0.0–1.0)
pH: 6 (ref 5.0–8.0)

## 2013-04-13 NOTE — Progress Notes (Signed)
P=[id number]  Initial prenatal visit recent visit to MAU for bleeding after intercourse.

## 2013-04-13 NOTE — Progress Notes (Signed)
Subjective:    Sandra Harding is being seen today for her first obstetrical visit.  This is not a planned pregnancy. She is at 336w1d gestation. Her obstetrical history is significant for none. Relationship with FOB: significant other, living together. Patient does intend to breast feed. Pregnancy history fully reviewed.  Menstrual History: OB History   Grav Para Term Preterm Abortions TAB SAB Ect Mult Living   1                No LMP recorded. Patient is pregnant.    The following portions of the patient's history were reviewed and updated as appropriate: allergies, current medications, past family history, past medical history, past social history, past surgical history and problem list.  Review of Systems A comprehensive review of systems was negative.    Objective:    BP 99/67  Temp(Src) 97 F (36.1 C)  Wt 136 lb 14.4 oz (62.097 kg)  General Appearance:    Alert, cooperative, no distress, appears stated age- exam done in MAU                                   Abdomen:     Soft, non-tender, bowel sounds active all four quadrants,    no masses, no organomegaly; umbilical ring                           Assessment:    Pregnancy at 15 and 1/7 weeks    Plan:    Initial labs drawn/ 1 hr GTT. Prenatal vitamins. Problem list reviewed and updated. AFP3 discussed: requested. Role of ultrasound in pregnancy discussed; fetal survey: requested. Amniocentesis discussed: not indicated. Follow up in 4 weeks. 60% of 40 min visit spent on counseling and coordination of care.

## 2013-04-13 NOTE — Patient Instructions (Signed)
Second Trimester of Pregnancy The second trimester is from week 13 through week 28, months 4 through 6. The second trimester is often a time when you feel your best. Your body has also adjusted to being pregnant, and you begin to feel better physically. Usually, morning sickness has lessened or quit completely, you may have more energy, and you may have an increase in appetite. The second trimester is also a time when the fetus is growing rapidly. At the end of the sixth month, the fetus is about 9 inches long and weighs about 1 pounds. You will likely begin to feel the baby move (quickening) between 18 and 20 weeks of the pregnancy. BODY CHANGES Your body goes through many changes during pregnancy. The changes vary from woman to woman.   Your weight will continue to increase. You will notice your lower abdomen bulging out.  You may begin to get stretch marks on your hips, abdomen, and breasts.  You may develop headaches that can be relieved by medicines approved by your caregiver.  You may urinate more often because the fetus is pressing on your bladder.  You may develop or continue to have heartburn as a result of your pregnancy.  You may develop constipation because certain hormones are causing the muscles that push waste through your intestines to slow down.  You may develop hemorrhoids or swollen, bulging veins (varicose veins).  You may have back pain because of the weight gain and pregnancy hormones relaxing your joints between the bones in your pelvis and as a result of a shift in weight and the muscles that support your balance.  Your breasts will continue to grow and be tender.  Your gums may bleed and may be sensitive to brushing and flossing.  Dark spots or blotches (chloasma, mask of pregnancy) may develop on your face. This will likely fade after the baby is born.  A dark line from your belly button to the pubic area (linea nigra) may appear. This will likely fade after the  baby is born. WHAT TO EXPECT AT YOUR PRENATAL VISITS During a routine prenatal visit:  You will be weighed to make sure you and the fetus are growing normally.  Your blood pressure will be taken.  Your abdomen will be measured to track your baby's growth.  The fetal heartbeat will be listened to.  Any test results from the previous visit will be discussed. Your caregiver may ask you:  How you are feeling.  If you are feeling the baby move.  If you have had any abnormal symptoms, such as leaking fluid, bleeding, severe headaches, or abdominal cramping.  If you have any questions. Other tests that may be performed during your second trimester include:  Blood tests that check for:  Low iron levels (anemia).  Gestational diabetes (between 24 and 28 weeks).  Rh antibodies.  Urine tests to check for infections, diabetes, or protein in the urine.  An ultrasound to confirm the proper growth and development of the baby.  An amniocentesis to check for possible genetic problems.  Fetal screens for spina bifida and Down syndrome. HOME CARE INSTRUCTIONS   Avoid all smoking, herbs, alcohol, and unprescribed drugs. These chemicals affect the formation and growth of the baby.  Follow your caregiver's instructions regarding medicine use. There are medicines that are either safe or unsafe to take during pregnancy.  Exercise only as directed by your caregiver. Experiencing uterine cramps is a good sign to stop exercising.  Continue to eat regular,   healthy meals.  Wear a good support bra for breast tenderness.  Do not use hot tubs, steam rooms, or saunas.  Wear your seat belt at all times when driving.  Avoid raw meat, uncooked cheese, cat litter boxes, and soil used by cats. These carry germs that can cause birth defects in the baby.  Take your prenatal vitamins.  Try taking a stool softener (if your caregiver approves) if you develop constipation. Eat more high-fiber foods,  such as fresh vegetables or fruit and whole grains. Drink plenty of fluids to keep your urine clear or pale yellow.  Take warm sitz baths to soothe any pain or discomfort caused by hemorrhoids. Use hemorrhoid cream if your caregiver approves.  If you develop varicose veins, wear support hose. Elevate your feet for 15 minutes, 3 4 times a day. Limit salt in your diet.  Avoid heavy lifting, wear low heel shoes, and practice good posture.  Rest with your legs elevated if you have leg cramps or low back pain.  Visit your dentist if you have not gone yet during your pregnancy. Use a soft toothbrush to brush your teeth and be gentle when you floss.  A sexual relationship may be continued unless your caregiver directs you otherwise.  Continue to go to all your prenatal visits as directed by your caregiver. SEEK MEDICAL CARE IF:   You have dizziness.  You have mild pelvic cramps, pelvic pressure, or nagging pain in the abdominal area.  You have persistent nausea, vomiting, or diarrhea.  You have a bad smelling vaginal discharge.  You have pain with urination. SEEK IMMEDIATE MEDICAL CARE IF:   You have a fever.  You are leaking fluid from your vagina.  You have spotting or bleeding from your vagina.  You have severe abdominal cramping or pain.  You have rapid weight gain or loss.  You have shortness of breath with chest pain.  You notice sudden or extreme swelling of your face, hands, ankles, feet, or legs.  You have not felt your baby move in over an hour.  You have severe headaches that do not go away with medicine.  You have vision changes. Document Released: 01/01/2001 Document Revised: 09/09/2012 Document Reviewed: 03/10/2012 ExitCare Patient Information 2014 [employer].  

## 2013-04-14 LAB — AFP, QUAD SCREEN
AFP: 46.4 IU/mL
Age Alone: 1:1180 {titer}
CURR GEST AGE: 15.1 wks.days
HCG TOTAL: 8555 m[IU]/mL
INH: 210.3 pg/mL
INTERPRETATION-AFP: NEGATIVE
MOM FOR HCG: 0.28
MOM FOR INH: 1.01
MoM for AFP: 1.64
OPEN SPINA BIFIDA: NEGATIVE
TRI 18 SCR RISK EST: NEGATIVE
Trisomy 18 (Edward) Syndrome Interp.: 1:2170 {titer}
UE3 MOM: 0.87
UE3 VALUE: 0.3 ng/mL

## 2013-04-14 LAB — OBSTETRIC PANEL
Antibody Screen: NEGATIVE
BASOS ABS: 0 10*3/uL (ref 0.0–0.1)
Basophils Relative: 0 % (ref 0–1)
Eosinophils Absolute: 0.1 10*3/uL (ref 0.0–0.7)
Eosinophils Relative: 2 % (ref 0–5)
HCT: 33.3 % — ABNORMAL LOW (ref 36.0–46.0)
Hemoglobin: 11.5 g/dL — ABNORMAL LOW (ref 12.0–15.0)
Hepatitis B Surface Ag: NEGATIVE
Lymphocytes Relative: 21 % (ref 12–46)
Lymphs Abs: 1.3 10*3/uL (ref 0.7–4.0)
MCH: 29.8 pg (ref 26.0–34.0)
MCHC: 34.5 g/dL (ref 30.0–36.0)
MCV: 86.3 fL (ref 78.0–100.0)
Monocytes Absolute: 0.4 10*3/uL (ref 0.1–1.0)
Monocytes Relative: 7 % (ref 3–12)
Neutro Abs: 4.3 10*3/uL (ref 1.7–7.7)
Neutrophils Relative %: 70 % (ref 43–77)
Platelets: 263 10*3/uL (ref 150–400)
RBC: 3.86 MIL/uL — ABNORMAL LOW (ref 3.87–5.11)
RDW: 12.7 % (ref 11.5–15.5)
Rh Type: POSITIVE
Rubella: 2.49 Index — ABNORMAL HIGH (ref <0.90)
WBC: 6.2 10*3/uL (ref 4.0–10.5)

## 2013-04-14 LAB — GLUCOSE TOLERANCE, 1 HOUR (50G) W/O FASTING: Glucose, 1 Hour GTT: 52 mg/dL — ABNORMAL LOW (ref 70–140)

## 2013-04-14 LAB — HIV ANTIBODY (ROUTINE TESTING W REFLEX): HIV: NONREACTIVE

## 2013-04-14 LAB — GC/CHLAMYDIA PROBE AMP
CT PROBE, AMP APTIMA: NEGATIVE
GC Probe RNA: NEGATIVE

## 2013-04-15 ENCOUNTER — Encounter: Payer: Self-pay | Admitting: Obstetrics & Gynecology

## 2013-04-15 LAB — PRESCRIPTION MONITORING PROFILE (19 PANEL)
AMPHETAMINE/METH: NEGATIVE ng/mL
BARBITURATE SCREEN, URINE: NEGATIVE ng/mL
BENZODIAZEPINE SCREEN, URINE: NEGATIVE ng/mL
Buprenorphine, Urine: NEGATIVE ng/mL
COCAINE METABOLITES: NEGATIVE ng/mL
Carisoprodol, Urine: NEGATIVE ng/mL
Creatinine, Urine: 195.42 mg/dL (ref >20.0)
FENTANYL URINE: NEGATIVE ng/mL
MDMA URINE: NEGATIVE ng/mL
Meperidine, Ur: NEGATIVE ng/mL
Methadone Screen, Urine: NEGATIVE ng/mL
Methaqualone: NEGATIVE ng/mL
Nitrites, Initial: NEGATIVE ug/mL
OPIATE SCREEN, URINE: NEGATIVE ng/mL
Oxycodone Screen, Ur: NEGATIVE ng/mL
PH URINE, INITIAL: 6 pH (ref 4.5–8.9)
Phencyclidine, Ur: NEGATIVE ng/mL
Propoxyphene: NEGATIVE ng/mL
TAPENTADOLUR: NEGATIVE ng/mL
TRAMADOL UR: NEGATIVE ng/mL
ZOLPIDEM, URINE: NEGATIVE ng/mL

## 2013-04-15 LAB — CANNABANOIDS (GC/LC/MS), URINE: THC-COOH (GC/LC/MS), ur confirm: 97 ng/mL — AB

## 2013-04-16 ENCOUNTER — Encounter: Payer: Self-pay | Admitting: Obstetrics & Gynecology

## 2013-04-16 DIAGNOSIS — O99322 Drug use complicating pregnancy, second trimester: Secondary | ICD-10-CM | POA: Insufficient documentation

## 2013-04-16 LAB — CULTURE, OB URINE: Colony Count: 40000

## 2013-04-26 ENCOUNTER — Ambulatory Visit (HOSPITAL_COMMUNITY)
Admission: RE | Admit: 2013-04-26 | Discharge: 2013-04-26 | Disposition: A | Discharge status: Home / Self Care | Payer: Medicaid Other | Source: Ambulatory Visit | Attending: Obstetrics & Gynecology | Admitting: Obstetrics & Gynecology

## 2013-04-26 DIAGNOSIS — Z3490 Encounter for supervision of normal pregnancy, unspecified, unspecified trimester: Secondary | ICD-10-CM

## 2013-04-26 DIAGNOSIS — Z3689 Encounter for other specified antenatal screening: Secondary | ICD-10-CM | POA: Insufficient documentation

## 2013-04-27 ENCOUNTER — Telehealth: Payer: Self-pay

## 2013-04-27 DIAGNOSIS — Z3490 Encounter for supervision of normal pregnancy, unspecified, unspecified trimester: Secondary | ICD-10-CM

## 2013-04-27 NOTE — Telephone Encounter (Signed)
F/u ultrasound scheduled for 06/22/13 at 0945. Called pt and left message with appointment date, time, location and directions to arrive at 0930. Radiology number given to call if she needs to reschedule.

## 2013-04-27 NOTE — Telephone Encounter (Signed)
Message copied by Louanna RawAMPBELL, Merian Wroe M on Tue Apr 27, 2013 11:53 AM ------      Message from: Franchot MimesALFARO, ANAIZ      Created: Tue Apr 27, 2013 11:51 AM                   ----- Message -----         From: Willodean Rosenthalarolyn Harraway-Smith, MD         Sent: 04/26/2013  10:55 PM           To: [facility] Admin Pool            Please schedule f/u sono in 8 weeks.  Complete anatomy            clh-S ------

## 2013-05-05 NOTE — MAU Provider Note (Signed)
Attestation of Attending Supervision of Advanced Practitioner (CNM/NP): Evaluation and management procedures were performed by the Advanced Practitioner under my supervision and collaboration. I have reviewed the Advanced Practitioner's note and chart, and I agree with the management and plan.  Fredrich RomansKelly H Shannin Naab 2:02 PM

## 2013-05-12 ENCOUNTER — Ambulatory Visit (INDEPENDENT_AMBULATORY_CARE_PROVIDER_SITE_OTHER): Payer: Medicaid Other | Admitting: Obstetrics and Gynecology

## 2013-05-12 VITALS — BP 114/66 | HR 87 | Temp 97.2°F | Wt 139.0 lb

## 2013-05-12 DIAGNOSIS — O2390 Unspecified genitourinary tract infection in pregnancy, unspecified trimester: Secondary | ICD-10-CM

## 2013-05-12 DIAGNOSIS — Z3490 Encounter for supervision of normal pregnancy, unspecified, unspecified trimester: Secondary | ICD-10-CM

## 2013-05-12 DIAGNOSIS — O2340 Unspecified infection of urinary tract in pregnancy, unspecified trimester: Secondary | ICD-10-CM

## 2013-05-12 DIAGNOSIS — N390 Urinary tract infection, site not specified: Secondary | ICD-10-CM

## 2013-05-12 DIAGNOSIS — Z3480 Encounter for supervision of other normal pregnancy, unspecified trimester: Secondary | ICD-10-CM

## 2013-05-12 LAB — POCT URINALYSIS DIP (DEVICE)
BILIRUBIN URINE: NEGATIVE
Glucose, UA: NEGATIVE mg/dL
HGB URINE DIPSTICK: NEGATIVE
Ketones, ur: NEGATIVE mg/dL
NITRITE: NEGATIVE
PH: 6.5 (ref 5.0–8.0)
Protein, ur: NEGATIVE mg/dL
SPECIFIC GRAVITY, URINE: 1.025 (ref 1.005–1.030)
Urobilinogen, UA: 0.2 mg/dL (ref 0.0–1.0)

## 2013-05-12 NOTE — Progress Notes (Signed)
Doing well. Reviewed nl US (needs US in 8 wks to complete fetal survey - scheduled 6/2) and quad screen neg. Asked her to tell us when it's been 6 wks since she used marijuana and we will check another UDS. Encouraged to start classes. Works at [employer].

## 2013-05-12 NOTE — Patient Instructions (Signed)
Second Trimester of Pregnancy The second trimester is from week 13 through week 28, months 4 through 6. The second trimester is often a time when you feel your best. Your body has also adjusted to being pregnant, and you begin to feel better physically. Usually, morning sickness has lessened or quit completely, you may have more energy, and you may have an increase in appetite. The second trimester is also a time when the fetus is growing rapidly. At the end of the sixth month, the fetus is about 9 inches long and weighs about 1 pounds. You will likely begin to feel the baby move (quickening) between 18 and 20 weeks of the pregnancy. BODY CHANGES Your body goes through many changes during pregnancy. The changes vary from woman to woman.   Your weight will continue to increase. You will notice your lower abdomen bulging out.  You may begin to get stretch marks on your hips, abdomen, and breasts.  You may develop headaches that can be relieved by medicines approved by your caregiver.  You may urinate more often because the fetus is pressing on your bladder.  You may develop or continue to have heartburn as a result of your pregnancy.  You may develop constipation because certain hormones are causing the muscles that push waste through your intestines to slow down.  You may develop hemorrhoids or swollen, bulging veins (varicose veins).  You may have back pain because of the weight gain and pregnancy hormones relaxing your joints between the bones in your pelvis and as a result of a shift in weight and the muscles that support your balance.  Your breasts will continue to grow and be tender.  Your gums may bleed and may be sensitive to brushing and flossing.  Dark spots or blotches (chloasma, mask of pregnancy) may develop on your face. This will likely fade after the baby is born.  A dark line from your belly button to the pubic area (linea nigra) may appear. This will likely fade after the  baby is born. WHAT TO EXPECT AT YOUR PRENATAL VISITS During a routine prenatal visit:  You will be weighed to make sure you and the fetus are growing normally.  Your blood pressure will be taken.  Your abdomen will be measured to track your baby's growth.  The fetal heartbeat will be listened to.  Any test results from the previous visit will be discussed. Your caregiver may ask you:  How you are feeling.  If you are feeling the baby move.  If you have had any abnormal symptoms, such as leaking fluid, bleeding, severe headaches, or abdominal cramping.  If you have any questions. Other tests that may be performed during your second trimester include:  Blood tests that check for:  Low iron levels (anemia).  Gestational diabetes (between 24 and 28 weeks).  Rh antibodies.  Urine tests to check for infections, diabetes, or protein in the urine.  An ultrasound to confirm the proper growth and development of the baby.  An amniocentesis to check for possible genetic problems.  Fetal screens for spina bifida and Down syndrome. HOME CARE INSTRUCTIONS   Avoid all smoking, herbs, alcohol, and unprescribed drugs. These chemicals affect the formation and growth of the baby.  Follow your caregiver's instructions regarding medicine use. There are medicines that are either safe or unsafe to take during pregnancy.  Exercise only as directed by your caregiver. Experiencing uterine cramps is a good sign to stop exercising.  Continue to eat regular,   healthy meals.  Wear a good support bra for breast tenderness.  Do not use hot tubs, steam rooms, or saunas.  Wear your seat belt at all times when driving.  Avoid raw meat, uncooked cheese, cat litter boxes, and soil used by cats. These carry germs that can cause birth defects in the baby.  Take your prenatal vitamins.  Try taking a stool softener (if your caregiver approves) if you develop constipation. Eat more high-fiber foods,  such as fresh vegetables or fruit and whole grains. Drink plenty of fluids to keep your urine clear or pale yellow.  Take warm sitz baths to soothe any pain or discomfort caused by hemorrhoids. Use hemorrhoid cream if your caregiver approves.  If you develop varicose veins, wear support hose. Elevate your feet for 15 minutes, 3 4 times a day. Limit salt in your diet.  Avoid heavy lifting, wear low heel shoes, and practice good posture.  Rest with your legs elevated if you have leg cramps or low back pain.  Visit your dentist if you have not gone yet during your pregnancy. Use a soft toothbrush to brush your teeth and be gentle when you floss.  A sexual relationship may be continued unless your caregiver directs you otherwise.  Continue to go to all your prenatal visits as directed by your caregiver. SEEK MEDICAL CARE IF:   You have dizziness.  You have mild pelvic cramps, pelvic pressure, or nagging pain in the abdominal area.  You have persistent nausea, vomiting, or diarrhea.  You have a bad smelling vaginal discharge.  You have pain with urination. SEEK IMMEDIATE MEDICAL CARE IF:   You have a fever.  You are leaking fluid from your vagina.  You have spotting or bleeding from your vagina.  You have severe abdominal cramping or pain.  You have rapid weight gain or loss.  You have shortness of breath with chest pain.  You notice sudden or extreme swelling of your face, hands, ankles, feet, or legs.  You have not felt your baby move in over an hour.  You have severe headaches that do not go away with medicine.  You have vision changes. Document Released: 01/01/2001 Document Revised: 09/09/2012 Document Reviewed: 03/10/2012 ExitCare Patient Information 2014 [employer].  

## 2013-06-09 ENCOUNTER — Ambulatory Visit (INDEPENDENT_AMBULATORY_CARE_PROVIDER_SITE_OTHER): Payer: Medicaid Other | Admitting: Advanced Practice Midwife

## 2013-06-09 VITALS — BP 121/72 | HR 76 | Wt 142.0 lb

## 2013-06-09 DIAGNOSIS — Z3480 Encounter for supervision of other normal pregnancy, unspecified trimester: Secondary | ICD-10-CM

## 2013-06-09 DIAGNOSIS — Z3490 Encounter for supervision of normal pregnancy, unspecified, unspecified trimester: Secondary | ICD-10-CM

## 2013-06-09 LAB — POCT URINALYSIS DIP (DEVICE)
BILIRUBIN URINE: NEGATIVE
Glucose, UA: NEGATIVE mg/dL
KETONES UR: NEGATIVE mg/dL
LEUKOCYTES UA: NEGATIVE
Nitrite: NEGATIVE
PH: 6.5 (ref 5.0–8.0)
PROTEIN: NEGATIVE mg/dL
SPECIFIC GRAVITY, URINE: 1.025 (ref 1.005–1.030)
Urobilinogen, UA: 0.2 mg/dL (ref 0.0–1.0)

## 2013-06-09 NOTE — Progress Notes (Signed)
Has followup US scheduled 6/2.  Very excited to hear FHTs.

## 2013-06-09 NOTE — Patient Instructions (Signed)
Second Trimester of Pregnancy The second trimester is from week 13 through week 28, months 4 through 6. The second trimester is often a time when you feel your best. Your body has also adjusted to being pregnant, and you begin to feel better physically. Usually, morning sickness has lessened or quit completely, you may have more energy, and you may have an increase in appetite. The second trimester is also a time when the fetus is growing rapidly. At the end of the sixth month, the fetus is about 9 inches long and weighs about 1 pounds. You will likely begin to feel the baby move (quickening) between 18 and 20 weeks of the pregnancy. BODY CHANGES Your body goes through many changes during pregnancy. The changes vary from woman to woman.   Your weight will continue to increase. You will notice your lower abdomen bulging out.  You may begin to get stretch marks on your hips, abdomen, and breasts.  You may develop headaches that can be relieved by medicines approved by your caregiver.  You may urinate more often because the fetus is pressing on your bladder.  You may develop or continue to have heartburn as a result of your pregnancy.  You may develop constipation because certain hormones are causing the muscles that push waste through your intestines to slow down.  You may develop hemorrhoids or swollen, bulging veins (varicose veins).  You may have back pain because of the weight gain and pregnancy hormones relaxing your joints between the bones in your pelvis and as a result of a shift in weight and the muscles that support your balance.  Your breasts will continue to grow and be tender.  Your gums may bleed and may be sensitive to brushing and flossing.  Dark spots or blotches (chloasma, mask of pregnancy) may develop on your face. This will likely fade after the baby is born.  A dark line from your belly button to the pubic area (linea nigra) may appear. This will likely fade after the  baby is born. WHAT TO EXPECT AT YOUR PRENATAL VISITS During a routine prenatal visit:  You will be weighed to make sure you and the fetus are growing normally.  Your blood pressure will be taken.  Your abdomen will be measured to track your baby's growth.  The fetal heartbeat will be listened to.  Any test results from the previous visit will be discussed. Your caregiver may ask you:  How you are feeling.  If you are feeling the baby move.  If you have had any abnormal symptoms, such as leaking fluid, bleeding, severe headaches, or abdominal cramping.  If you have any questions. Other tests that may be performed during your second trimester include:  Blood tests that check for:  Low iron levels (anemia).  Gestational diabetes (between 24 and 28 weeks).  Rh antibodies.  Urine tests to check for infections, diabetes, or protein in the urine.  An ultrasound to confirm the proper growth and development of the baby.  An amniocentesis to check for possible genetic problems.  Fetal screens for spina bifida and Down syndrome. HOME CARE INSTRUCTIONS   Avoid all smoking, herbs, alcohol, and unprescribed drugs. These chemicals affect the formation and growth of the baby.  Follow your caregiver's instructions regarding medicine use. There are medicines that are either safe or unsafe to take during pregnancy.  Exercise only as directed by your caregiver. Experiencing uterine cramps is a good sign to stop exercising.  Continue to eat regular,   healthy meals.  Wear a good support bra for breast tenderness.  Do not use hot tubs, steam rooms, or saunas.  Wear your seat belt at all times when driving.  Avoid raw meat, uncooked cheese, cat litter boxes, and soil used by cats. These carry germs that can cause birth defects in the baby.  Take your prenatal vitamins.  Try taking a stool softener (if your caregiver approves) if you develop constipation. Eat more high-fiber foods,  such as fresh vegetables or fruit and whole grains. Drink plenty of fluids to keep your urine clear or pale yellow.  Take warm sitz baths to soothe any pain or discomfort caused by hemorrhoids. Use hemorrhoid cream if your caregiver approves.  If you develop varicose veins, wear support hose. Elevate your feet for 15 minutes, 3 4 times a day. Limit salt in your diet.  Avoid heavy lifting, wear low heel shoes, and practice good posture.  Rest with your legs elevated if you have leg cramps or low back pain.  Visit your dentist if you have not gone yet during your pregnancy. Use a soft toothbrush to brush your teeth and be gentle when you floss.  A sexual relationship may be continued unless your caregiver directs you otherwise.  Continue to go to all your prenatal visits as directed by your caregiver. SEEK MEDICAL CARE IF:   You have dizziness.  You have mild pelvic cramps, pelvic pressure, or nagging pain in the abdominal area.  You have persistent nausea, vomiting, or diarrhea.  You have a bad smelling vaginal discharge.  You have pain with urination. SEEK IMMEDIATE MEDICAL CARE IF:   You have a fever.  You are leaking fluid from your vagina.  You have spotting or bleeding from your vagina.  You have severe abdominal cramping or pain.  You have rapid weight gain or loss.  You have shortness of breath with chest pain.  You notice sudden or extreme swelling of your face, hands, ankles, feet, or legs.  You have not felt your baby move in over an hour.  You have severe headaches that do not go away with medicine.  You have vision changes. Document Released: 01/01/2001 Document Revised: 09/09/2012 Document Reviewed: 03/10/2012 ExitCare Patient Information 2014 [employer].  

## 2013-06-22 ENCOUNTER — Ambulatory Visit (HOSPITAL_COMMUNITY)
Admission: RE | Admit: 2013-06-22 | Discharge: 2013-06-22 | Disposition: A | Discharge status: Home / Self Care | Payer: Medicaid Other | Source: Ambulatory Visit | Attending: Obstetrics & Gynecology | Admitting: Obstetrics & Gynecology

## 2013-06-22 DIAGNOSIS — Z3490 Encounter for supervision of normal pregnancy, unspecified, unspecified trimester: Secondary | ICD-10-CM

## 2013-06-22 DIAGNOSIS — Z3689 Encounter for other specified antenatal screening: Secondary | ICD-10-CM | POA: Insufficient documentation

## 2013-07-07 ENCOUNTER — Ambulatory Visit (INDEPENDENT_AMBULATORY_CARE_PROVIDER_SITE_OTHER): Payer: Medicaid Other | Admitting: Advanced Practice Midwife

## 2013-07-07 VITALS — BP 119/73 | HR 73 | Temp 97.4°F | Wt 152.2 lb

## 2013-07-07 DIAGNOSIS — Z3480 Encounter for supervision of other normal pregnancy, unspecified trimester: Secondary | ICD-10-CM

## 2013-07-07 DIAGNOSIS — Z3490 Encounter for supervision of normal pregnancy, unspecified, unspecified trimester: Secondary | ICD-10-CM

## 2013-07-07 DIAGNOSIS — O99340 Other mental disorders complicating pregnancy, unspecified trimester: Secondary | ICD-10-CM

## 2013-07-07 DIAGNOSIS — F1910 Other psychoactive substance abuse, uncomplicated: Secondary | ICD-10-CM

## 2013-07-07 DIAGNOSIS — O99322 Drug use complicating pregnancy, second trimester: Secondary | ICD-10-CM

## 2013-07-07 LAB — CBC
HCT: 31.7 % — ABNORMAL LOW (ref 36.0–46.0)
Hemoglobin: 11.2 g/dL — ABNORMAL LOW (ref 12.0–15.0)
MCH: 30.8 pg (ref 26.0–34.0)
MCHC: 35.3 g/dL (ref 30.0–36.0)
MCV: 87.1 fL (ref 78.0–100.0)
PLATELETS: 281 10*3/uL (ref 150–400)
RBC: 3.64 MIL/uL — ABNORMAL LOW (ref 3.87–5.11)
RDW: 13.3 % (ref 11.5–15.5)
WBC: 7.6 10*3/uL (ref 4.0–10.5)

## 2013-07-07 LAB — POCT URINALYSIS DIP (DEVICE)
Bilirubin Urine: NEGATIVE
GLUCOSE, UA: NEGATIVE mg/dL
HGB URINE DIPSTICK: NEGATIVE
Ketones, ur: NEGATIVE mg/dL
NITRITE: NEGATIVE
Protein, ur: NEGATIVE mg/dL
Specific Gravity, Urine: 1.02 (ref 1.005–1.030)
UROBILINOGEN UA: 0.2 mg/dL (ref 0.0–1.0)
pH: 7 (ref 5.0–8.0)

## 2013-07-07 NOTE — Progress Notes (Signed)
I have seen this patient and agree with the above resident's note. Reviewed LARCs as most effective form of contraception.  Pt will consider but may desire progesterone only pill after delivery.  LEFTWICH-KIRBY, LISA Certified Nurse-Midwife

## 2013-07-07 NOTE — Progress Notes (Signed)
Has been nauseated since this weekend, similar to how she felt in the first trimester No emesis, no diarrhea, no fever or chills, otherwise has been well  Org was taking phenergan and zofran with minimal relief during that time period   FM+, no vb, no LOF, no CTX   Sandra Harding is a 18 y.o G1P0 at 27w2 who presents for routine OB visit US reviewed- clinical f/up as indicated  Not currently interested in medications for nausea Updated information in problem list regarding contraception- interested in the minipill  Given information regarding other types of contraception

## 2013-07-08 LAB — SYPHILIS: RPR W/REFLEX TO RPR TITER AND TREPONEMAL ANTIBODIES, TRADITIONAL SCREENING AND DIAGNOSIS ALGORITHM

## 2013-07-08 LAB — GLUCOSE TOLERANCE, 1 HOUR (50G) W/O FASTING: Glucose, 1 Hour GTT: 60 mg/dL — ABNORMAL LOW (ref 70–140)

## 2013-07-08 LAB — HIV ANTIBODY (ROUTINE TESTING W REFLEX): HIV 1&2 Ab, 4th Generation: NONREACTIVE

## 2013-07-19 ENCOUNTER — Ambulatory Visit (INDEPENDENT_AMBULATORY_CARE_PROVIDER_SITE_OTHER): Payer: Medicaid Other | Admitting: Family Medicine

## 2013-07-19 ENCOUNTER — Encounter: Payer: Self-pay | Admitting: Family Medicine

## 2013-07-19 VITALS — BP 113/72 | HR 70 | Wt 155.9 lb

## 2013-07-19 DIAGNOSIS — O99340 Other mental disorders complicating pregnancy, unspecified trimester: Secondary | ICD-10-CM

## 2013-07-19 DIAGNOSIS — Z23 Encounter for immunization: Secondary | ICD-10-CM

## 2013-07-19 DIAGNOSIS — Z3480 Encounter for supervision of other normal pregnancy, unspecified trimester: Secondary | ICD-10-CM

## 2013-07-19 DIAGNOSIS — F1910 Other psychoactive substance abuse, uncomplicated: Secondary | ICD-10-CM

## 2013-07-19 DIAGNOSIS — O99322 Drug use complicating pregnancy, second trimester: Secondary | ICD-10-CM

## 2013-07-19 DIAGNOSIS — Z3492 Encounter for supervision of normal pregnancy, unspecified, second trimester: Secondary | ICD-10-CM

## 2013-07-19 LAB — POCT URINALYSIS DIP (DEVICE)
Bilirubin Urine: NEGATIVE
GLUCOSE, UA: NEGATIVE mg/dL
Hgb urine dipstick: NEGATIVE
Ketones, ur: NEGATIVE mg/dL
Leukocytes, UA: NEGATIVE
Nitrite: NEGATIVE
PH: 6 (ref 5.0–8.0)
Protein, ur: NEGATIVE mg/dL
Specific Gravity, Urine: 1.025 (ref 1.005–1.030)
Urobilinogen, UA: 0.2 mg/dL (ref 0.0–1.0)

## 2013-07-19 NOTE — Patient Instructions (Signed)
Third Trimester of Pregnancy The third trimester is from week 29 through week 42, months 7 through 9. The third trimester is a time when the fetus is growing rapidly. At the end of the ninth month, the fetus is about 20 inches in length and weighs 6-10 pounds.  BODY CHANGES Your body goes through many changes during pregnancy. The changes vary from woman to woman.   Your weight will continue to increase. You can expect to gain 25-35 pounds (11-16 kg) by the end of the pregnancy.  You may begin to get stretch marks on your hips, abdomen, and breasts.  You may urinate more often because the fetus is moving lower into your pelvis and pressing on your bladder.  You may develop or continue to have heartburn as a result of your pregnancy.  You may develop constipation because certain hormones are causing the muscles that push waste through your intestines to slow down.  You may develop hemorrhoids or swollen, bulging veins (varicose veins).  You may have pelvic pain because of the weight gain and pregnancy hormones relaxing your joints between the bones in your pelvis. Backaches may result from overexertion of the muscles supporting your posture.  You may have changes in your hair. These can include thickening of your hair, rapid growth, and changes in texture. Some women also have hair loss during or after pregnancy, or hair that feels dry or thin. Your hair will most likely return to normal after your baby is born.  Your breasts will continue to grow and be tender. A yellow discharge may leak from your breasts called colostrum.  Your belly button may stick out.  You may feel short of breath because of your expanding uterus.  You may notice the fetus "dropping," or moving lower in your abdomen.  You may have a bloody mucus discharge. This usually occurs a few days to a week before labor begins.  Your cervix becomes thin and soft (effaced) near your due date. WHAT TO EXPECT AT YOUR PRENATAL  EXAMS  You will have prenatal exams every 2 weeks until week 36. Then, you will have weekly prenatal exams. During a routine prenatal visit:  You will be weighed to make sure you and the fetus are growing normally.  Your blood pressure is taken.  Your abdomen will be measured to track your baby's growth.  The fetal heartbeat will be listened to.  Any test results from the previous visit will be discussed.  You may have a cervical check near your due date to see if you have effaced. At around 36 weeks, your caregiver will check your cervix. At the same time, your caregiver will also perform a test on the secretions of the vaginal tissue. This test is to determine if a type of bacteria, Group B streptococcus, is present. Your caregiver will explain this further. Your caregiver may ask you:  What your birth plan is.  How you are feeling.  If you are feeling the baby move.  If you have had any abnormal symptoms, such as leaking fluid, bleeding, severe headaches, or abdominal cramping.  If you have any questions. Other tests or screenings that may be performed during your third trimester include:  Blood tests that check for low iron levels (anemia).  Fetal testing to check the health, activity level, and growth of the fetus. Testing is done if you have certain medical conditions or if there are problems during the pregnancy. FALSE LABOR You may feel small, irregular contractions that   eventually go away. These are called Braxton Hicks contractions, or false labor. Contractions may last for hours, days, or even weeks before true labor sets in. If contractions come at regular intervals, intensify, or become painful, it is best to be seen by your caregiver.  SIGNS OF LABOR   Menstrual-like cramps.  Contractions that are 5 minutes apart or less.  Contractions that start on the top of the uterus and spread down to the lower abdomen and back.  A sense of increased pelvic pressure or back  pain.  A watery or bloody mucus discharge that comes from the vagina. If you have any of these signs before the 37th week of pregnancy, call your caregiver right away. You need to go to the hospital to get checked immediately. HOME CARE INSTRUCTIONS   Avoid all smoking, herbs, alcohol, and unprescribed drugs. These chemicals affect the formation and growth of the baby.  Follow your caregiver's instructions regarding medicine use. There are medicines that are either safe or unsafe to take during pregnancy.  Exercise only as directed by your caregiver. Experiencing uterine cramps is a good sign to stop exercising.  Continue to eat regular, healthy meals.  Wear a good support bra for breast tenderness.  Do not use hot tubs, steam rooms, or saunas.  Wear your seat belt at all times when driving.  Avoid raw meat, uncooked cheese, cat litter boxes, and soil used by cats. These carry germs that can cause birth defects in the baby.  Take your prenatal vitamins.  Try taking a stool softener (if your caregiver approves) if you develop constipation. Eat more high-fiber foods, such as fresh vegetables or fruit and whole grains. Drink plenty of fluids to keep your urine clear or pale yellow.  Take warm sitz baths to soothe any pain or discomfort caused by hemorrhoids. Use hemorrhoid cream if your caregiver approves.  If you develop varicose veins, wear support hose. Elevate your feet for 15 minutes, 3-4 times a day. Limit salt in your diet.  Avoid heavy lifting, wear low heal shoes, and practice good posture.  Rest a lot with your legs elevated if you have leg cramps or low back pain.  Visit your dentist if you have not gone during your pregnancy. Use a soft toothbrush to brush your teeth and be gentle when you floss.  A sexual relationship may be continued unless your caregiver directs you otherwise.  Do not travel far distances unless it is absolutely necessary and only with the approval  of your caregiver.  Take prenatal classes to understand, practice, and ask questions about the labor and delivery.  Make a trial run to the hospital.  Pack your hospital bag.  Prepare the baby's nursery.  Continue to go to all your prenatal visits as directed by your caregiver. SEEK MEDICAL CARE IF:  You are unsure if you are in labor or if your water has broken.  You have dizziness.  You have mild pelvic cramps, pelvic pressure, or nagging pain in your abdominal area.  You have persistent nausea, vomiting, or diarrhea.  You have a bad smelling vaginal discharge.  You have pain with urination. SEEK IMMEDIATE MEDICAL CARE IF:   You have a fever.  You are leaking fluid from your vagina.  You have spotting or bleeding from your vagina.  You have severe abdominal cramping or pain.  You have rapid weight loss or gain.  You have shortness of breath with chest pain.  You notice sudden or extreme swelling   of your face, hands, ankles, feet, or legs.  You have not felt your baby move in over an hour.  You have severe headaches that do not go away with medicine.  You have vision changes. Document Released: 01/01/2001 Document Revised: 01/12/2013 Document Reviewed: 03/10/2012 ExitCare Patient Information 2015 [employer]. This information is not intended to replace advice given to you by your health care provider. Make sure you discuss any questions you have with your health care provider.  

## 2013-07-19 NOTE — Progress Notes (Signed)
S: 18 yo G1 @ 7823w0d here for ROBV - doing well - worried about weight gain - no ctx, lof, vb. +FM  O: see flowhseet  A/P - total gain of 25 lbs. Reassurance and diet discussed.  - PTL precautions discussed.  - f/u in 2 weeks.  - reviewed 28 week labs.

## 2013-08-04 ENCOUNTER — Ambulatory Visit (INDEPENDENT_AMBULATORY_CARE_PROVIDER_SITE_OTHER): Payer: Medicaid Other | Admitting: Family

## 2013-08-04 VITALS — BP 117/77 | HR 78 | Temp 97.1°F | Wt 161.4 lb

## 2013-08-04 DIAGNOSIS — Z3480 Encounter for supervision of other normal pregnancy, unspecified trimester: Secondary | ICD-10-CM

## 2013-08-04 DIAGNOSIS — Z3492 Encounter for supervision of normal pregnancy, unspecified, second trimester: Secondary | ICD-10-CM

## 2013-08-04 LAB — POCT URINALYSIS DIP (DEVICE)
Bilirubin Urine: NEGATIVE
Glucose, UA: NEGATIVE mg/dL
Hgb urine dipstick: NEGATIVE
KETONES UR: NEGATIVE mg/dL
Nitrite: NEGATIVE
PROTEIN: NEGATIVE mg/dL
Specific Gravity, Urine: 1.02 (ref 1.005–1.030)
Urobilinogen, UA: 0.2 mg/dL (ref 0.0–1.0)
pH: 6 (ref 5.0–8.0)

## 2013-08-04 NOTE — Progress Notes (Signed)
Pt is unsure about Tdap vaccine, information sheet given.

## 2013-08-04 NOTE — Progress Notes (Signed)
No questions or concerns.  Will decided about tDap at next visit.

## 2013-08-18 ENCOUNTER — Ambulatory Visit (INDEPENDENT_AMBULATORY_CARE_PROVIDER_SITE_OTHER): Payer: Medicaid Other | Admitting: Advanced Practice Midwife

## 2013-08-18 ENCOUNTER — Encounter: Payer: Self-pay | Admitting: Advanced Practice Midwife

## 2013-08-18 VITALS — BP 111/71 | HR 69 | Wt 165.5 lb

## 2013-08-18 DIAGNOSIS — Z3493 Encounter for supervision of normal pregnancy, unspecified, third trimester: Secondary | ICD-10-CM

## 2013-08-18 DIAGNOSIS — Z3480 Encounter for supervision of other normal pregnancy, unspecified trimester: Secondary | ICD-10-CM

## 2013-08-18 DIAGNOSIS — Z23 Encounter for immunization: Secondary | ICD-10-CM

## 2013-08-18 LAB — POCT URINALYSIS DIP (DEVICE)
Bilirubin Urine: NEGATIVE
Glucose, UA: NEGATIVE mg/dL
Hgb urine dipstick: NEGATIVE
Ketones, ur: NEGATIVE mg/dL
Leukocytes, UA: NEGATIVE
NITRITE: NEGATIVE
Protein, ur: NEGATIVE mg/dL
SPECIFIC GRAVITY, URINE: 1.02 (ref 1.005–1.030)
Urobilinogen, UA: 0.2 mg/dL (ref 0.0–1.0)
pH: 7 (ref 5.0–8.0)

## 2013-08-18 NOTE — Progress Notes (Signed)
C/O Sciatica on Right. Discussed.  Will try heel lift on right shoe. Reviewed signs to report. TDAP today

## 2013-08-18 NOTE — Patient Instructions (Signed)
Third Trimester of Pregnancy The third trimester is from week 29 through week 42, months 7 through 9. The third trimester is a time when the fetus is growing rapidly. At the end of the ninth month, the fetus is about 20 inches in length and weighs 6-10 pounds.  BODY CHANGES Your body goes through many changes during pregnancy. The changes vary from woman to woman.   Your weight will continue to increase. You can expect to gain 25-35 pounds (11-16 kg) by the end of the pregnancy.  You may begin to get stretch marks on your hips, abdomen, and breasts.  You may urinate more often because the fetus is moving lower into your pelvis and pressing on your bladder.  You may develop or continue to have heartburn as a result of your pregnancy.  You may develop constipation because certain hormones are causing the muscles that push waste through your intestines to slow down.  You may develop hemorrhoids or swollen, bulging veins (varicose veins).  You may have pelvic pain because of the weight gain and pregnancy hormones relaxing your joints between the bones in your pelvis. Backaches may result from overexertion of the muscles supporting your posture.  You may have changes in your hair. These can include thickening of your hair, rapid growth, and changes in texture. Some women also have hair loss during or after pregnancy, or hair that feels dry or thin. Your hair will most likely return to normal after your baby is born.  Your breasts will continue to grow and be tender. A yellow discharge may leak from your breasts called colostrum.  Your belly button may stick out.  You may feel short of breath because of your expanding uterus.  You may notice the fetus "dropping," or moving lower in your abdomen.  You may have a bloody mucus discharge. This usually occurs a few days to a week before labor begins.  Your cervix becomes thin and soft (effaced) near your due date. WHAT TO EXPECT AT YOUR PRENATAL  EXAMS  You will have prenatal exams every 2 weeks until week 36. Then, you will have weekly prenatal exams. During a routine prenatal visit:  You will be weighed to make sure you and the fetus are growing normally.  Your blood pressure is taken.  Your abdomen will be measured to track your baby's growth.  The fetal heartbeat will be listened to.  Any test results from the previous visit will be discussed.  You may have a cervical check near your due date to see if you have effaced. At around 36 weeks, your caregiver will check your cervix. At the same time, your caregiver will also perform a test on the secretions of the vaginal tissue. This test is to determine if a type of bacteria, Group B streptococcus, is present. Your caregiver will explain this further. Your caregiver may ask you:  What your birth plan is.  How you are feeling.  If you are feeling the baby move.  If you have had any abnormal symptoms, such as leaking fluid, bleeding, severe headaches, or abdominal cramping.  If you have any questions. Other tests or screenings that may be performed during your third trimester include:  Blood tests that check for low iron levels (anemia).  Fetal testing to check the health, activity level, and growth of the fetus. Testing is done if you have certain medical conditions or if there are problems during the pregnancy. FALSE LABOR You may feel small, irregular contractions that   eventually go away. These are called Braxton Hicks contractions, or false labor. Contractions may last for hours, days, or even weeks before true labor sets in. If contractions come at regular intervals, intensify, or become painful, it is best to be seen by your caregiver.  SIGNS OF LABOR   Menstrual-like cramps.  Contractions that are 5 minutes apart or less.  Contractions that start on the top of the uterus and spread down to the lower abdomen and back.  A sense of increased pelvic pressure or back  pain.  A watery or bloody mucus discharge that comes from the vagina. If you have any of these signs before the 37th week of pregnancy, call your caregiver right away. You need to go to the hospital to get checked immediately. HOME CARE INSTRUCTIONS   Avoid all smoking, herbs, alcohol, and unprescribed drugs. These chemicals affect the formation and growth of the baby.  Follow your caregiver's instructions regarding medicine use. There are medicines that are either safe or unsafe to take during pregnancy.  Exercise only as directed by your caregiver. Experiencing uterine cramps is a good sign to stop exercising.  Continue to eat regular, healthy meals.  Wear a good support bra for breast tenderness.  Do not use hot tubs, steam rooms, or saunas.  Wear your seat belt at all times when driving.  Avoid raw meat, uncooked cheese, cat litter boxes, and soil used by cats. These carry germs that can cause birth defects in the baby.  Take your prenatal vitamins.  Try taking a stool softener (if your caregiver approves) if you develop constipation. Eat more high-fiber foods, such as fresh vegetables or fruit and whole grains. Drink plenty of fluids to keep your urine clear or pale yellow.  Take warm sitz baths to soothe any pain or discomfort caused by hemorrhoids. Use hemorrhoid cream if your caregiver approves.  If you develop varicose veins, wear support hose. Elevate your feet for 15 minutes, 3-4 times a day. Limit salt in your diet.  Avoid heavy lifting, wear low heal shoes, and practice good posture.  Rest a lot with your legs elevated if you have leg cramps or low back pain.  Visit your dentist if you have not gone during your pregnancy. Use a soft toothbrush to brush your teeth and be gentle when you floss.  A sexual relationship may be continued unless your caregiver directs you otherwise.  Do not travel far distances unless it is absolutely necessary and only with the approval  of your caregiver.  Take prenatal classes to understand, practice, and ask questions about the labor and delivery.  Make a trial run to the hospital.  Pack your hospital bag.  Prepare the baby's nursery.  Continue to go to all your prenatal visits as directed by your caregiver. SEEK MEDICAL CARE IF:  You are unsure if you are in labor or if your water has broken.  You have dizziness.  You have mild pelvic cramps, pelvic pressure, or nagging pain in your abdominal area.  You have persistent nausea, vomiting, or diarrhea.  You have a bad smelling vaginal discharge.  You have pain with urination. SEEK IMMEDIATE MEDICAL CARE IF:   You have a fever.  You are leaking fluid from your vagina.  You have spotting or bleeding from your vagina.  You have severe abdominal cramping or pain.  You have rapid weight loss or gain.  You have shortness of breath with chest pain.  You notice sudden or extreme swelling   of your face, hands, ankles, feet, or legs.  You have not felt your baby move in over an hour.  You have severe headaches that do not go away with medicine.  You have vision changes. Document Released: 01/01/2001 Document Revised: 01/12/2013 Document Reviewed: 03/10/2012 ExitCare Patient Information 2015 [employer]. This information is not intended to replace advice given to you by your health care provider. Make sure you discuss any questions you have with your health care provider.  

## 2013-08-18 NOTE — Progress Notes (Signed)
Patient reports pain in her right buttock that has been going on for 3 days.

## 2013-08-24 ENCOUNTER — Inpatient Hospital Stay (HOSPITAL_COMMUNITY)
Admission: AD | Admit: 2013-08-24 | Discharge: 2013-08-24 | Disposition: A | Discharge status: Home / Self Care | Payer: Medicaid Other | Source: Ambulatory Visit | Attending: Family Medicine | Admitting: Family Medicine

## 2013-08-24 ENCOUNTER — Encounter (HOSPITAL_COMMUNITY): Payer: Self-pay | Admitting: *Deleted

## 2013-08-24 DIAGNOSIS — Z809 Family history of malignant neoplasm, unspecified: Secondary | ICD-10-CM | POA: Insufficient documentation

## 2013-08-24 DIAGNOSIS — Z3493 Encounter for supervision of normal pregnancy, unspecified, third trimester: Secondary | ICD-10-CM

## 2013-08-24 DIAGNOSIS — N898 Other specified noninflammatory disorders of vagina: Secondary | ICD-10-CM | POA: Insufficient documentation

## 2013-08-24 DIAGNOSIS — F1910 Other psychoactive substance abuse, uncomplicated: Secondary | ICD-10-CM

## 2013-08-24 DIAGNOSIS — O2343 Unspecified infection of urinary tract in pregnancy, third trimester: Secondary | ICD-10-CM

## 2013-08-24 DIAGNOSIS — Z23 Encounter for immunization: Secondary | ICD-10-CM

## 2013-08-24 DIAGNOSIS — N760 Acute vaginitis: Secondary | ICD-10-CM

## 2013-08-24 DIAGNOSIS — O9989 Other specified diseases and conditions complicating pregnancy, childbirth and the puerperium: Principal | ICD-10-CM

## 2013-08-24 DIAGNOSIS — Z833 Family history of diabetes mellitus: Secondary | ICD-10-CM | POA: Diagnosis not present

## 2013-08-24 DIAGNOSIS — O99340 Other mental disorders complicating pregnancy, unspecified trimester: Secondary | ICD-10-CM

## 2013-08-24 DIAGNOSIS — B9689 Other specified bacterial agents as the cause of diseases classified elsewhere: Secondary | ICD-10-CM

## 2013-08-24 DIAGNOSIS — O99891 Other specified diseases and conditions complicating pregnancy: Secondary | ICD-10-CM | POA: Insufficient documentation

## 2013-08-24 DIAGNOSIS — O99322 Drug use complicating pregnancy, second trimester: Secondary | ICD-10-CM

## 2013-08-24 LAB — URINALYSIS, ROUTINE W REFLEX MICROSCOPIC
BILIRUBIN URINE: NEGATIVE
GLUCOSE, UA: NEGATIVE mg/dL
HGB URINE DIPSTICK: NEGATIVE
Ketones, ur: 15 mg/dL — AB
Nitrite: NEGATIVE
PH: 6.5 (ref 5.0–8.0)
Protein, ur: NEGATIVE mg/dL
SPECIFIC GRAVITY, URINE: 1.015 (ref 1.005–1.030)
Urobilinogen, UA: 0.2 mg/dL (ref 0.0–1.0)

## 2013-08-24 LAB — WET PREP, GENITAL
TRICH WET PREP: NONE SEEN
YEAST WET PREP: NONE SEEN

## 2013-08-24 LAB — OB RESULTS CONSOLE GC/CHLAMYDIA
Chlamydia: NEGATIVE
Gonorrhea: NEGATIVE

## 2013-08-24 LAB — POCT FERN TEST: POCT Fern Test: NEGATIVE

## 2013-08-24 LAB — URINE MICROSCOPIC-ADD ON

## 2013-08-24 MED ORDER — METRONIDAZOLE 500 MG PO TABS: 500.0000 mg | ORAL_TABLET | Freq: Two times a day (BID) | ORAL | Status: DC

## 2013-08-24 NOTE — MAU Note (Signed)
Braxton hicks for past 2 days, found small spot of fluid in bed @ 1500, has been dripping clear fluid.  Unsure if SROM.  Denies bleeding.

## 2013-08-24 NOTE — MAU Note (Signed)
Urine in lab 

## 2013-08-24 NOTE — Discharge Instructions (Signed)
Bacterial Vaginosis Bacterial vaginosis is a vaginal infection that occurs when the normal balance of bacteria in the vagina is disrupted. It results from an overgrowth of certain bacteria. This is the most common vaginal infection in women of childbearing age. Treatment is important to prevent complications, especially in pregnant women, as it can cause a premature delivery. CAUSES  Bacterial vaginosis is caused by an increase in harmful bacteria that are normally present in smaller amounts in the vagina. Several different kinds of bacteria can cause bacterial vaginosis. However, the reason that the condition develops is not fully understood. RISK FACTORS Certain activities or behaviors can put you at an increased risk of developing bacterial vaginosis, including:  Having a new sex partner or multiple sex partners.  Douching.  Using an intrauterine device (IUD) for contraception. Women do not get bacterial vaginosis from toilet seats, bedding, swimming pools, or contact with objects around them. SIGNS AND SYMPTOMS  Some women with bacterial vaginosis have no signs or symptoms. Common symptoms include:  Grey vaginal discharge.  A fishlike odor with discharge, especially after sexual intercourse.  Itching or burning of the vagina and vulva.  Burning or pain with urination. DIAGNOSIS  Your health care provider will take a medical history and examine the vagina for signs of bacterial vaginosis. A sample of vaginal fluid may be taken. Your health care provider will look at this sample under a microscope to check for bacteria and abnormal cells. A vaginal pH test may also be done.  TREATMENT  Bacterial vaginosis may be treated with antibiotic medicines. These may be given in the form of a pill or a vaginal cream. A second round of antibiotics may be prescribed if the condition comes back after treatment.  HOME CARE INSTRUCTIONS   Only take over-the-counter or prescription medicines as  directed by your health care provider.  If antibiotic medicine was prescribed, take it as directed. Make sure you finish it even if you start to feel better.  Do not have sex until treatment is completed.  Tell all sexual partners that you have a vaginal infection. They should see their health care provider and be treated if they have problems, such as a mild rash or itching.  Practice safe sex by using condoms and only having one sex partner. SEEK MEDICAL CARE IF:   Your symptoms are not improving after 3 days of treatment.  You have increased discharge or pain.  You have a fever. MAKE SURE YOU:   Understand these instructions.  Will watch your condition.  Will get help right away if you are not doing well or get worse. FOR MORE INFORMATION  Centers for Disease Control and Prevention, Division of STD Prevention: www.cdc.gov/std American Sexual Health Association (ASHA): www.ashastd.org  Document Released: 01/07/2005 Document Revised: 10/28/2012 Document Reviewed: 08/19/2012 ExitCare Patient Information 2015 [employer]. This information is not intended to replace advice given to you by your health care provider. Make sure you discuss any questions you have with your health care provider.  

## 2013-08-24 NOTE — MAU Provider Note (Signed)
First Provider Initiated Contact with Patient 08/24/13 1753      Chief Complaint:  Rupture of Membranes   Sandra Harding is  18 y.o. G1P0 at 6820w1d presents complaining of Rupture of Membranes .  She states none contractions are associated with none vaginal bleeding, intact membranes, along with active fetal movement. Pt. Complaining of white vaginal discharge and wet spot on clothing x 6 hours. She denies vaginal itching / burning. She endorses fetal movement. No LOF, VB, or contractions. She denies fever, chills, dysuria, hematuria, back pain, or low pelvic pain. She has no other complaints.   Obstetrical/Gynecological History: OB History   Grav Para Term Preterm Abortions TAB SAB Ect Mult Living   1         0     Past Medical History: History reviewed. No pertinent past medical history.  Past Surgical History: History reviewed. No pertinent past surgical history.  Family History: Family History  Problem Relation Age of Onset  . Diabetes Paternal Aunt   . Cancer Maternal Grandfather     Social History: History  Substance Use Topics  . Smoking status: Never Smoker   . Smokeless tobacco: Not on file  . Alcohol Use: No    Allergies:  Allergies  Allergen Reactions  . Sulfa Antibiotics Rash and Hives    Meds:  Prescriptions prior to admission  Medication Sig Dispense Refill  . Multiple Vitamin (MULTIVITAMIN) capsule Take 1 capsule by mouth daily.        Review of Systems -  Per HPI    Physical Exam  Blood pressure 115/70, pulse 86, temperature 98.1 F (36.7 C), temperature source Oral, resp. rate 18. GENERAL: Well-developed, well-nourished female in no acute distress.  LUNGS: Clear to auscultation bilaterally.  HEART: Regular rate and rhythm. ABDOMEN: Soft, nontender, nondistended, gravid.  EXTREMITIES: Nontender, no edema, 2+ distal pulses. Grossly neurologically intact.  CERVICAL EXAM: Dilatation0 cm   Effacement 0%   Station -3   Presentation:  cephalic FHT:  Baseline rate 135 bpm   Variability moderate  Accelerations present   Decelerations none Contractions: None   Labs: Results for orders placed during the hospital encounter of 08/24/13 (from the past 24 hour(s))  WET PREP, GENITAL   Collection Time    08/24/13  6:30 PM      Result Value Ref Range   Yeast Wet Prep HPF POC NONE SEEN  NONE SEEN   Trich, Wet Prep NONE SEEN  NONE SEEN   Clue Cells Wet Prep HPF POC FEW (*) NONE SEEN   WBC, Wet Prep HPF POC FEW (*) NONE SEEN  POCT FERN TEST   Collection Time    08/24/13  7:06 PM      Result Value Ref Range   POCT Fern Test Negative = intact amniotic membranes     Imaging Studies:  No results found.  Assessment: Sandra Harding BatonBuckner is  18 y.o. G1P0 at 3720w1d presents with Vaginal discharge.  Plan: Fern Negative Wet prep positive for Clue cells No trichomonas seen Cultures pending Urinalysis negative Treat with Flagyl 500mg  BID x 7 days.  Return for worsening of symptoms or for any other concern.   [place], Sandra Harding 8/4/20157:07 PM  I have seen and examined this patient and agree with above documentation in the resident's note.  - pt feeling well, agree with flagyl for BV.  Sandra LatheKristy Christohper Dube, MD OB Fellow 08/26/2013 6:08 PM

## 2013-08-25 LAB — GC/CHLAMYDIA PROBE AMP
CT PROBE, AMP APTIMA: NEGATIVE
GC Probe RNA: NEGATIVE

## 2013-09-06 ENCOUNTER — Ambulatory Visit (INDEPENDENT_AMBULATORY_CARE_PROVIDER_SITE_OTHER): Payer: Medicaid Other | Admitting: Family Medicine

## 2013-09-06 VITALS — BP 131/79 | HR 79 | Temp 98.2°F | Wt 171.9 lb

## 2013-09-06 DIAGNOSIS — Z3480 Encounter for supervision of other normal pregnancy, unspecified trimester: Secondary | ICD-10-CM

## 2013-09-06 DIAGNOSIS — Z3493 Encounter for supervision of normal pregnancy, unspecified, third trimester: Secondary | ICD-10-CM

## 2013-09-06 LAB — POCT URINALYSIS DIP (DEVICE)
Bilirubin Urine: NEGATIVE
Glucose, UA: NEGATIVE mg/dL
Hgb urine dipstick: NEGATIVE
Ketones, ur: NEGATIVE mg/dL
NITRITE: NEGATIVE
PROTEIN: NEGATIVE mg/dL
Specific Gravity, Urine: 1.02 (ref 1.005–1.030)
UROBILINOGEN UA: 0.2 mg/dL (ref 0.0–1.0)
pH: 7 (ref 5.0–8.0)

## 2013-09-06 LAB — OB RESULTS CONSOLE GBS: STREP GROUP B AG: NEGATIVE

## 2013-09-06 NOTE — Progress Notes (Signed)
Patient is 18 y.o. G1P0 6240w0d.  +FM, denies LOF, VB, contractions, vaginal discharge (resolved with flagyl, s/p MAU visit).  Overall feeling well. - GBS today

## 2013-09-06 NOTE — Patient Instructions (Signed)
Group B Streptococcus Infection During Pregnancy Group B streptococcus (GBS) is a type of bacteria often found in healthy women. GBS is not the same as the bacteria that causes strep throat. You may have GBS in your vagina, rectum, or bladder. GBS does not spread through sexual contact, but it can be passed to a baby during childbirth. This can be dangerous for your baby. It is not dangerous to you and usually does not cause any symptoms. Your health care provider may test you for GBS when your pregnancy is between 35 and 37 weeks. GBS is dangerous only during birth, so there is no need to test for it earlier. It is possible to have GBS during pregnancy and never pass it to your baby. If your test results are positive for GBS, your health care provider may recommend giving you antibiotic medicine during delivery to make sure your baby stays healthy. RISK FACTORS You are more likely to pass GBS to your baby if:   Your water breaks (ruptured membrane) or you go into labor before 37 weeks.  Your water breaks 18 hours before you deliver.  You passed GBS during a previous pregnancy.  You have a urinary tract infection caused by GBS any time during pregnancy.  You have a fever during labor. SYMPTOMS Most women who have GBS do not have any symptoms. If you have a urinary tract infection caused by GBS, you might have frequent or painful urination and fever. Babies who get GBS usually show symptoms within 7 days of birth. Symptoms may include:   Breathing problems.  Heart and blood pressure problems.  Digestive and kidney problems. DIAGNOSIS Routine screening for GBS is recommended for all pregnant women. A health care provider takes a sample of the fluid in your vagina and rectum with a swab. It is then sent to a lab to be checked for GBS. A sample of your urine may also be checked for the bacteria.  TREATMENT If you test positive for GBS, you may need treatment with an antibiotic medicine during  labor. As soon as you go into labor, or as soon as your membranes rupture, you will get the antibiotic medicine through an IV access. You will continue to get the medicine until after you give birth. You do not need antibiotic medicine if you are having a cesarean delivery.If your baby shows signs or symptoms of GBS after birth, your baby can also be treated with an antibiotic medicine. HOME CARE INSTRUCTIONS   Take all antibiotic medicine as prescribed by your health care provider. Only take medicine as directed.   Continue with prenatal visits and care.   Keep all follow-up appointments.  SEEK MEDICAL CARE IF:   You have pain when you urinate.   You have to urinate frequently.   You have a fever.  SEEK IMMEDIATE MEDICAL CARE IF:   Your membranes rupture.  You go into labor. Document Released: 04/16/2007 Document Revised: 01/12/2013 Document Reviewed: 10/30/2012 ExitCare Patient Information 2015 [employer]. This information is not intended to replace advice given to you by your health care provider. Make sure you discuss any questions you have with your health care provider.  

## 2013-09-06 NOTE — Progress Notes (Signed)
C/o of intermittent pelvic pressure and braxton hicks.  Reports occasional edema in hands and feet.  GBS today-- GC/ch done 08/24/13.

## 2013-09-09 LAB — CULTURE, BETA STREP (GROUP B ONLY)

## 2013-09-15 ENCOUNTER — Ambulatory Visit (INDEPENDENT_AMBULATORY_CARE_PROVIDER_SITE_OTHER): Payer: Medicaid Other | Admitting: Advanced Practice Midwife

## 2013-09-15 VITALS — BP 117/70 | HR 70 | Temp 97.8°F | Wt 174.6 lb

## 2013-09-15 DIAGNOSIS — O368190 Decreased fetal movements, unspecified trimester, not applicable or unspecified: Secondary | ICD-10-CM

## 2013-09-15 DIAGNOSIS — Z3493 Encounter for supervision of normal pregnancy, unspecified, third trimester: Secondary | ICD-10-CM

## 2013-09-15 DIAGNOSIS — Z3480 Encounter for supervision of other normal pregnancy, unspecified trimester: Secondary | ICD-10-CM

## 2013-09-15 LAB — POCT URINALYSIS DIP (DEVICE)
Bilirubin Urine: NEGATIVE
Glucose, UA: NEGATIVE mg/dL
HGB URINE DIPSTICK: NEGATIVE
KETONES UR: NEGATIVE mg/dL
NITRITE: NEGATIVE
PH: 6 (ref 5.0–8.0)
PROTEIN: NEGATIVE mg/dL
Specific Gravity, Urine: 1.015 (ref 1.005–1.030)
Urobilinogen, UA: 0.2 mg/dL (ref 0.0–1.0)

## 2013-09-15 NOTE — Progress Notes (Signed)
Pt  Reports decreased movement in the past 1.5 days.

## 2013-09-15 NOTE — Progress Notes (Signed)
Patient here with no change in maternal status. Pt states that she has noticed decreased fetal movement recently (as little as 3/day). No report of contractions. Appetite is good. Discussed with patient the option of having a NST today, she would like one. NST obtained in office.

## 2013-09-15 NOTE — Progress Notes (Signed)
Pt felt good FM during NST. 

## 2013-09-15 NOTE — Patient Instructions (Signed)
Braxton Hicks Contractions °Contractions of the uterus can occur throughout pregnancy. Contractions are not always a sign that you are in labor.  °WHAT ARE BRAXTON HICKS CONTRACTIONS?  °Contractions that occur before labor are called Braxton Hicks contractions, or false labor. Toward the end of pregnancy (32-34 weeks), these contractions can develop more often and may become more forceful. This is not true labor because these contractions do not result in opening (dilatation) and thinning of the cervix. They are sometimes difficult to tell apart from true labor because these contractions can be forceful and people have different pain tolerances. You should not feel embarrassed if you go to the hospital with false labor. Sometimes, the only way to tell if you are in true labor is for your health care provider to look for changes in the cervix. °If there are no prenatal problems or other health problems associated with the pregnancy, it is completely safe to be sent home with false labor and await the onset of true labor. °HOW CAN YOU TELL THE DIFFERENCE BETWEEN TRUE AND FALSE LABOR? °False Labor °· The contractions of false labor are usually shorter and not as hard as those of true labor.   °· The contractions are usually irregular.   °· The contractions are often felt in the front of the lower abdomen and in the groin.   °· The contractions may go away when you walk around or change positions while lying down.   °· The contractions get weaker and are shorter lasting as time goes on.   °· The contractions do not usually become progressively stronger, regular, and closer together as with true labor.   °True Labor °· Contractions in true labor last 30-70 seconds, become very regular, usually become more intense, and increase in frequency.   °· The contractions do not go away with walking.   °· The discomfort is usually felt in the top of the uterus and spreads to the lower abdomen and low back.   °· True labor can be  determined by your health care provider with an exam. This will show that the cervix is dilating and getting thinner.   °WHAT TO REMEMBER °· Keep up with your usual exercises and follow other instructions given by your health care provider.   °· Take medicines as directed by your health care provider.   °· Keep your regular prenatal appointments.   °· Eat and drink lightly if you think you are going into labor.   °· If Braxton Hicks contractions are making you uncomfortable:   °¨ Change your position from lying down or resting to walking, or from walking to resting.   °¨ Sit and rest in a tub of warm water.   °¨ Drink 2-3 glasses of water. Dehydration may cause these contractions.   °¨ Do slow and deep breathing several times an hour.   °WHEN SHOULD I SEEK IMMEDIATE MEDICAL CARE? °Seek immediate medical care if: °· Your contractions become stronger, more regular, and closer together.   °· You have fluid leaking or gushing from your vagina.   °· You have a fever.   °· You pass blood-tinged mucus.   °· You have vaginal bleeding.   °· You have continuous abdominal pain.   °· You have low back pain that you never had before.   °· You feel your baby's head pushing down and causing pelvic pressure.   °· Your baby is not moving as much as it used to.   °Document Released: 01/07/2005 Document Revised: 01/12/2013 Document Reviewed: 10/19/2012 °ExitCare® Patient Information ©2015 [employer]. This information is not intended to replace advice given to you by your health care   provider. Make sure you discuss any questions you have with your health care provider. ° °

## 2013-09-22 ENCOUNTER — Ambulatory Visit (INDEPENDENT_AMBULATORY_CARE_PROVIDER_SITE_OTHER): Payer: Medicaid Other | Admitting: Family

## 2013-09-22 VITALS — BP 119/78 | HR 79 | Temp 97.6°F | Wt 176.1 lb

## 2013-09-22 DIAGNOSIS — Z3493 Encounter for supervision of normal pregnancy, unspecified, third trimester: Secondary | ICD-10-CM

## 2013-09-22 DIAGNOSIS — Z3480 Encounter for supervision of other normal pregnancy, unspecified trimester: Secondary | ICD-10-CM

## 2013-09-22 LAB — POCT URINALYSIS DIP (DEVICE)
Bilirubin Urine: NEGATIVE
Glucose, UA: NEGATIVE mg/dL
Hgb urine dipstick: NEGATIVE
Ketones, ur: NEGATIVE mg/dL
Nitrite: NEGATIVE
PH: 6.5 (ref 5.0–8.0)
PROTEIN: NEGATIVE mg/dL
SPECIFIC GRAVITY, URINE: 1.015 (ref 1.005–1.030)
UROBILINOGEN UA: 0.2 mg/dL (ref 0.0–1.0)

## 2013-09-22 NOTE — Progress Notes (Signed)
No questions or concerns.  Good fetal movement. Cervical check closed, baby low in pelvis.

## 2013-09-26 ENCOUNTER — Inpatient Hospital Stay (HOSPITAL_COMMUNITY)
Admission: AD | Admit: 2013-09-26 | Discharge: 2013-09-29 | DRG: 765 | Disposition: A | Discharge status: Home / Self Care | Payer: Medicaid Other | Source: Ambulatory Visit | Attending: Obstetrics & Gynecology | Admitting: Obstetrics & Gynecology

## 2013-09-26 ENCOUNTER — Encounter (HOSPITAL_COMMUNITY): Payer: Self-pay | Admitting: *Deleted

## 2013-09-26 DIAGNOSIS — O4290 Premature rupture of membranes, unspecified as to length of time between rupture and onset of labor, unspecified weeks of gestation: Secondary | ICD-10-CM | POA: Diagnosis present

## 2013-09-26 DIAGNOSIS — Z833 Family history of diabetes mellitus: Secondary | ICD-10-CM

## 2013-09-26 DIAGNOSIS — O99322 Drug use complicating pregnancy, second trimester: Secondary | ICD-10-CM

## 2013-09-26 DIAGNOSIS — O99891 Other specified diseases and conditions complicating pregnancy: Secondary | ICD-10-CM | POA: Diagnosis present

## 2013-09-26 DIAGNOSIS — O411090 Infection of amniotic sac and membranes, unspecified, unspecified trimester, not applicable or unspecified: Secondary | ICD-10-CM | POA: Diagnosis present

## 2013-09-26 HISTORY — DX: Other specified health status: Z78.9

## 2013-09-26 LAB — CBC
HCT: 32.6 % — ABNORMAL LOW (ref 36.0–46.0)
Hemoglobin: 10.9 g/dL — ABNORMAL LOW (ref 12.0–15.0)
MCH: 29.8 pg (ref 26.0–34.0)
MCHC: 33.4 g/dL (ref 30.0–36.0)
MCV: 89.1 fL (ref 78.0–100.0)
PLATELETS: 244 10*3/uL (ref 150–400)
RBC: 3.66 MIL/uL — ABNORMAL LOW (ref 3.87–5.11)
RDW: 12.4 % (ref 11.5–15.5)
WBC: 8.9 10*3/uL (ref 4.0–10.5)

## 2013-09-26 MED ORDER — CITRIC ACID-SODIUM CITRATE 334-500 MG/5ML PO SOLN
30.0000 mL | ORAL | Status: DC | PRN
  Filled 2013-09-26: qty 15

## 2013-09-26 MED ORDER — ACETAMINOPHEN 325 MG PO TABS: 650.0000 mg | ORAL_TABLET | ORAL | Status: DC | PRN

## 2013-09-26 MED ORDER — TERBUTALINE SULFATE 1 MG/ML IJ SOLN: 0.2500 mg | Freq: Once | INTRAMUSCULAR | Status: AC | PRN

## 2013-09-26 MED ORDER — LACTATED RINGERS IV SOLN
INTRAVENOUS | Status: DC
  Administered 2013-09-26 – 2013-09-27 (×2): via INTRAVENOUS

## 2013-09-26 MED ORDER — MISOPROSTOL 25 MCG QUARTER TABLET
50.0000 ug | ORAL_TABLET | ORAL | Status: DC | PRN
  Administered 2013-09-26: 50 ug via ORAL
  Filled 2013-09-26: qty 0.5

## 2013-09-26 MED ORDER — OXYTOCIN 40 UNITS IN LACTATED RINGERS INFUSION - SIMPLE MED: 62.5000 mL/h | INTRAVENOUS | Status: DC

## 2013-09-26 MED ORDER — MISOPROSTOL 25 MCG QUARTER TABLET: 50.0000 ug | ORAL_TABLET | ORAL | Status: DC | PRN

## 2013-09-26 MED ORDER — LIDOCAINE HCL (PF) 1 % IJ SOLN: 30.0000 mL | INTRAMUSCULAR | Status: DC | PRN

## 2013-09-26 MED ORDER — OXYCODONE-ACETAMINOPHEN 5-325 MG PO TABS: 1.0000 | ORAL_TABLET | ORAL | Status: DC | PRN

## 2013-09-26 MED ORDER — OXYTOCIN BOLUS FROM INFUSION: 500.0000 mL | INTRAVENOUS | Status: DC

## 2013-09-26 MED ORDER — ONDANSETRON HCL 4 MG/2ML IJ SOLN: 4.0000 mg | Freq: Four times a day (QID) | INTRAMUSCULAR | Status: DC | PRN

## 2013-09-26 MED ORDER — OXYCODONE-ACETAMINOPHEN 5-325 MG PO TABS: 2.0000 | ORAL_TABLET | ORAL | Status: DC | PRN

## 2013-09-26 MED ORDER — LACTATED RINGERS IV SOLN: 500.0000 mL | INTRAVENOUS | Status: DC | PRN

## 2013-09-26 NOTE — H&P (Signed)
LABOR ADMISSION HISTORY AND PHYSICAL  Tashari C Silos is a 18 y.o. female G1P0 with IUP at [redacted]w[redacted]d by 6w dating sono presenting for loss of fluid. She reports +FMs, No LOF, no VB, no blurry vision, headaches or peripheral edema, and RUQ pain. She desires an epidural for labor pain control. She plans on breast feeding. She request minipill for birth control.  Dating: By Rulon Eisenmenger --->  Estimated Date of Delivery: 10/04/13    Prenatal History/Complications:  Past Medical History: Past Medical History  Diagnosis Date  . Medical history non-contributory     Past Surgical History: Past Surgical History  Procedure Laterality Date  . No past surgeries      Obstetrical History: OB History   Grav Para Term Preterm Abortions TAB SAB Ect Mult Living   1         0     Social History: History   Social History  . Marital Status: Single    Spouse Name: N/A    Number of Children: N/A  . Years of Education: N/A   Social History Main Topics  . Smoking status: Never Smoker   . Smokeless tobacco: Never Used  . Alcohol Use: No  . Drug Use: No  . Sexual Activity: Yes    Birth Control/ Protection: None   Other Topics Concern  . None   Social History Narrative  . None    Family History: Family History  Problem Relation Age of Onset  . Diabetes Paternal Aunt   . Cancer Maternal Grandfather     Allergies: Allergies  Allergen Reactions  . Sulfa Antibiotics Hives and Rash    Prescriptions prior to admission  Medication Sig Dispense Refill  . Multiple Vitamin (MULTIVITAMIN WITH MINERALS) TABS tablet Take 1 tablet by mouth daily.         Review of Systems   All systems reviewed and negative except as stated in HPI  Blood pressure 132/86, pulse 95, temperature 98 F (36.7 C), temperature source Oral, resp. rate 16, height  (1.575 m), weight 180 lb 2 oz (81.704 kg). General appearance: alert and cooperative Lungs: clear to auscultation bilaterally Heart: regular rate  and rhythm Abdomen: soft, non-tender; bowel sounds normal Extremities: Homans sign is negative, no sign of DVT DTR's not examined Presentation: cephalic, confirmed by me with ultrasound Fetal monitoringBaseline: 135 bpm, Variability: Good {> 6 bpm), Accelerations: Reactive and Decelerations: Absent Uterine activity rare  Dilation: 1 (Clear fluid with vernix noted on perineum.) Effacement (%): 80 Station: 0 Exam by:: Dellie Burns, RN BSN   Prenatal labs: ABO, Rh: A/POS/-- (03/24 1042) Antibody: NEG (03/24 1042) Rubella:   RPR: NON REAC (06/17 1208)  HBsAg: NEGATIVE (03/24 1042)  HIV: NONREACTIVE (06/17 1208)  GBS: Negative (08/17 0000)  1 hr Glucola 60 Genetic screening  Quad neg Anatomy US normal   Clinic Low risk  Dating First trimester sono   Genetic Screen Quad neg  Anatomic [place] Normal.    GTT Early:               Third trimester: 60  TDaP vaccine  08/18/13  Flu vaccine 04/13/2013  GBS 08/18/2013-Neg  Contraception Mini pill vs Mirena  Baby Food breast  Circumcision It's a girl!  Pediatrician    Support Person Loretha Brasil  +THC on tox screen   No results found for this or any previous visit (from the past 24 hour(s)).  Patient Active Problem List   Diagnosis Date Noted  . UTI (urinary  tract infection) in pregnancy, antepartum 05/12/2013  . 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 Supervision of normal pregnancy 04/13/2013  . Flu vaccine need 04/13/2013    Assessment: Akiko C Virnig is a 18 y.o. G1P0 at [redacted]w[redacted]d here for SROM  #Labor: <MEASUREMEKentuckyAundria Bing Plume>[facility] [phone])771-ShaCornelious B51516Ka454VirgRhona Rai864-17Neuro0454u40rgMedical [place] Bing PlumesGardendale Surgery87 22650-680-ShaCornelious B(40516Kara Mead1Veterinary suVerline 44LemTheora GiGeo454VirgRhona Rai(541) 16Neuro0454u22rgMedical1 [place]Aund[place] Bing PlumesNorth Texas Team Care Surgery [phone]-ShaCornelious B27216Kara Mead1Veterina454VirgRhona Rai([phone])25Neuro0454u54rgMedical [place] Bing PlumesDavis Hospital And Medical36785-565-ShaCornelious B716Kara Mead1454VirgRhona Rai701-27Neuro0454u19rgMedical [place] Bing PlumesEssentia Hlth [facility] Mary[facility] 17407 522 ShaCornelious B(86416Kara Mead454VirgRhona Rai901-27Neuro0454u20rgMedical 1[place]Aund[place] Bing PlumesEmory Decatur H876249-108-ShaCornelious B50416Kara Mead1Veterinary suVerlin454VirgRhona Rai857-71Neuro0454u38rgMedical [place] Bing PlumesWaverley Surgery Cen73585-582-ShaCornelious B(978)16Kara Mead1Veterinary suVerli454VirgRhona Rai970-09Neuro0454u65rgMedical58[place]71 sMercy Rehabilitation Hospital Spri105ngfWill75iShaCornelious B78716.1Veterinary suVerline 31LemTheora GiGeorgeanna HarDan Humph5re52yNeurosu35rgMedical [place] Bing PlumesPortneuf [phone]) 376-ShaCornelious B47816Kara Mead1Veterinary suV454VirgRhona Rai(725)57Neuro0454u93rgMedical [place] Bing PlumesChristus Health - Shrevepor-70BosMiss36807-254-ShaCornelious B(618)16Kara Mead1Veterinar454VirgRhona Rai716 50Neuro0454u51rgMedica KentuckyAundria Bing Plu828954 145 ShaCorne454VirgRhona Rai570454-76<MEASUREMENTKentuckyAundria Bing Plume2[place] Sur11908-046-ShaCornelious B316Kara Mead1Veterinary suVerline 72L454VirgRhona Rai(480)34Neuro0454u72rgMedical [place] Bing PlumesEden Springs Healthc65a29309-401-ShaCornelious B(30316Kara Mead1Vet454VirgRhona Rai825 44Neuro0454u36rgMedical [place] Bing PlumesHampstead [phone])518-ShaCornelious B516Kara Mead1Veterinary suVerline 31Le454VirgRhona Rai(709) 84Neuro0454u73rgMedical [place] Bing PlumesVa Maine Healthcare Syste58m 41909282ShaCornelious B42516Kara Mead1Veterinary454VirgRhona Rai774-76Neuro0454u75r454VirgRhona Rai78045<MEASUREMEKentuckyAundria Bing Plume2[facility]91 C32401-848-ShaCornelious B42416Kara Mead1Veterinary suVerline 77Le454VirgRhona Rai226-06Neuro0454u5rgMedical KentuckyAundria Bing PlumesRehab Center At Rena1957704421ShaCornelious B516Kara Mead1Veterinary suVerline 34Lem454VirgRhona Rai720-85Neuro0454u16rgMedical<MEASUREMENTKentuckyAundria Bing PlumesOkeene Municipal H37340-858-ShaCornelious B46916Kara Mead1Veterinary suVe454VirgRhona Rai(618)61Neuro0454u9rgMedical KentuckyAund[place] Bing PlumesChi St Alexius Health Turt76908431ShaCornelious B47916Kara Mead1Veterinary suVerline454VirgRhona Rai772-67Neuro0454u75rgMedical [place] Bing PlumesOcean State Endoscopy88 56336-457-ShaCornelious B(84316Kara Mead454VirgRhona Rai769-40Neuro0454u32rgMedical [place] Bing PlumesHancock Regional Surgery 75810-117-ShaCornelious B84516Kara Mead1Veterinary suVerline 34LemTheora Gi454VirgRhona Rai737-36Neuro0454u75rgMedical KentuckyA[place] Bing PlumesHealthbridge Children'[facility] - [phone] 787-ShaCornelious B58716Kara Mead1Veterinary454VirgRhona Rai860-15Neuro0454u29rgMedical [place]ria Bing PlumesThe Surgery Center At Northbay Vaca4471850-406-ShaCornelious B(989)16Kara Mead1Veterinary suVerline 73LemTheora GiGeorgeanna HarrisPrRoney Dan Humph7S454VirgRhona Rai639 47Neuro0454u65rgMedical KentuckyAundria Bing PlumesSelect Specialty Hospital - [phone]-ShaCornelious B316Kara Mead1Veterina454VirgRhona Rai365-62Neuro0454u67rgMedical<MEASUREMENTKentuckyAundria Bing PlumesValley Ambulatory Surgical61 C45702-100-ShaCornelious B51016Kara Mead1Veterinary suVerline 454VirgRhona Rai331-22Neuro0454u4rgMedical [place] Bing PlumesSonoma Valley H12ospSt110401-333-ShaCornelious B(31516Kara Mead1Veter454VirgRhona Rai916-37Neuro0454u77rgMedical [place] Bing PlumesSuncoast Specialty Surgery Cent66er C36864 290 ShaCornelious B22616Kara Mead1Veterinary suVerline 102454VirgRhona Rai814 79Neuro0454u86rgMedical [place] Bing PlumesParkland Health Center-Fa27512-211-ShaCornelious B21016Kar454VirgRhona Rai438-17Neuro0454u61rgMedical1 KentuckyAundria Bing PlumesVa Medical Center - 601-192-ShaCornelious B(73716Kara Mead1Veterinary suVerline 10LemTheora GiGeorgeanna HarrisP454VirgRhona Rai225-62Neuro0454u43rgMedical [place] Bing PlumesEdward W Sparrow [phone])524-ShaCornelious B(71216Kara Mead1Veterinary suVerline 454VirgRhona Rai909 73Neuro0454u5rgMedical [place] Bing PlumesKindred Hospital - Gre49e34410-736-ShaCornelious B44216Kara Mead1Veterinary suVerline 20454VirgRhona Rai315-72Neuro0454u65rgMedical [place] Bing PlumesAvera Creighton H19o45816749ShaCornelious B(26216Kara Mead1Veterinary suVerline 62LemTheora GiGeorgeann454VirgRhona Rai(774) 34Neuro0454u83rgMedical [place] Bing PlumesUva Healthsouth Rehabilitation H50osp30(954) 334-ShaCornelious B(56716Kara Mea454VirgRhona Rai575-78Neuro0454u39rgMedical KentuckyAundria Bing PlumesEye Surgery Center Of 5585 674 ShaCornelious B83116Kara Mead1V454VirgRhona Rai6152Neuro0454u19rgMedical KentuckyAund[place] Bing PlumesValleycare Medical12 64970-108-ShaCornelious B72416Kara Mead1Veterinary suVerline 61LemTheora GiGeorgeanna Harr454VirgRhona Rai386-16Neuro0454u44rgMedical KentuckyAundria Bing PlumesVoa Ambulatory Surgery28 C67249-873-ShaCornelious B616Kara Mead1Veterinary su454VirgRhona Rai903-34Neuro0454u66rgMedical [place] Bing PlumesMhp Medical11 57737-158-ShaCornelious B716Kara Mead1Veterinary suVerline 55LemTheor454VirgRhona Rai401-37Neuro0454u68rgMedical [place]Aund[place] Bing PlumesYuma District H12os36708374ShaCornelious B36016Kara Mead1Veter454VirgRhona Rai(651) 20Neuro0454u82rgMedical [place] Bing PlumesMinden Family Medicine And Comple24te 18531-790-ShaCornelious B(87816Kara Mead1Vet454VirgRhona Rai813-62Neuro0454u43rgMedical [place] Bing PlumesCleburne Endoscopy Cen4terSan Ildef56405-228-ShaCornelious B(424)16Kara Mead1Veterinary suVerline 83LemThe454VirgRhona Rai541-71Neuro0454u59rgMedical [place] Bing PlumesEastside Endoscopy Cen8747-484-ShaCornelious B47816Kara Mead1454VirgRhona Rai984 78Neuro0454u15rgMedical [place] Bing PlumesPark Hill Surgery Cen14785-668-ShaCornelious B(86216Kara Mead1Veterinary suVerline 98LemTheora G454VirgRhona Rai979-80Neuro0454u43rgMedical KentuckyA[place] Bing PlumesKosair Children'S 55731-252-ShaCornelious B23416Kara Mead1Veterinary suVerline 72LemTheora GiGeorgeanna HarrisPrecious GilCarolina 454VirgRhona Rai959-20Neuro0454u12rgMedical<MEASUREMENTKentuckyAundria Bing PlumesColumbia Memorial H62os55808-478-ShaCornelious B66216Kara Mead454VirgRhona Rai2506Neuro0454u57rgMedical [place] Bing PlumesBaptist Health - Heber 1204 550 ShaCornelious B716Kara Mead1Vet454VirgRhona Rai206 64Neuro0454u35rgMedical [place] Bing PlumesGateway [phone])283-ShaCornelious B95416Kara Mead1Vete454VirgRhona Rai(807)43Neuro0454u68rgMedical [place] Bing PlumesMayo373939-386-ShaCornelious B93916Kara Mead1Veter454VirgRhona Rai6711Neuro0454u36rgMedical<MEASUREMEKentucky454VirgRhona Rai045 [place] Bing Plume1[facility] 75([phone])510-ShaCornelious B42516Kara Mead1Veterinary suVerline 25LemTheora GiGeorgeanna Ha454VirgRhona Rai416-51Neuro0454u32rgMedical [place] Bing PlumesDignity Health Rehabilitation [phone])380-ShaCornelious B(85616Kara Mead1Veterinary suVerline 109LemTheora GiGeorgean454VirgRhona Rai(631)44Neuro0454u42rgMedical KentuckyAund[place] Bing PlumesSelect Specialty Hospital - 55LinFruitr42612-230-ShaCornelious B(850)16Kara Mead1Veterinary suVerline 5454VirgRhona Rai(928)41Neuro0454u73rgMedical [place] Bing PlumesMartinsburg Va Medical72 C47(941) 508-ShaCornelious B43816Kara Mead1454VirgRhona Rai414-37Neuro0454u69rgMedical [place] Bing PlumesWestpark 336424 197 ShaCornelious B(30116Kara Mead1Veterinary suVerline454VirgRhona Rai514-72Neuro0454u22rgMedical [place] Bing PlumesProgress West Healthcare23 74512-141-ShaCornelious B98416Kara Mead1Veterinary suVerline 33LemTh454VirgRhona Rai510-75Neuro0454u79rgMedical [place] Bing PlumesPoplar Springs H1ospW77438-223-ShaCornelious B97016K454VirgRhona Rai754 39Neuro0454u41rgMedical<MEASUREMENTKentuckyAundria Bing PlumesNashville Gastrointestinal Specialists LLC Dba [facility]gs [facility] Endoscopy7628-803-ShaCornelious B(38516Kara Mead1Veterinary 454VirgRhona Rai938-55Neuro0454u66rgMedical<MEASUREMENTKentuckyAundria Bing PlumesCabinet Peaks Medical85 C38(415) 553-ShaCornelious B616Kara Mead1Veterin454VirgRhona Rai989 25Neuro0454u76rgMedical [place] Bing PlumesEndoscopy Center Of Northern O5209 711 ShaCornelious B31816Kara Mead1Veterinary suVerline 42LemTheora Gi454VirgRhona Rai(307)38Neuro0454u89rgMedical [place] Bing PlumesEndless Mountains Health 45SysEas40778 239 ShaCornelious B(43416Kara Mead1Veterinary suVerline454VirgRhona Rai408-23Neuro0454u28rgMedical [place] Bing PlumesHayward Area Memorial H17o38541-528-ShaCornelious B61816Kara Mead1Veterinary su454VirgRhona Rai(352) 53Neuro0454u44rgMedical KentuckyAundria Bing PlumesCenter For Special 63SurNo53917-270-ShaCornelious B60416Kara Mead1Veterinary suVerline 79LemT454VirgRhona Rai843-49Neuro0454u68rgMedical KentuckyA[place] Bing PlumesBassett Army Community H455213-278-ShaCornelious B78616Kara Mead1Veterinary suVerline 67LemTheora GiGeorgeann454VirgRhona Rai604 77Neuro0454u71rgMedical [place] Bing PlumesMelrosewkfld Healthcare [facility]52813-484-ShaCornelious B(72616Kara Mead1Veterinary454VirgRhona Rai254-74Neuro0454u70rgMedical<MEASUREMEKe454VirgRhona Rai(2045<MEASUREMENTKentuckyAundria Bing Plume5[facility] Ce49450309ShaCornelious B20716Kara Mead1Veterinary suVerline 34LemTh454VirgRhona Rai(872)11Neuro0454u55rgMedica KentuckyAundria Bing PlumesSt Louis Specialty Surgica26629-070-ShaCornelious B72016Kara Mead1Veterinary suVerline 57LemT454VirgRhona Rai(562)69Neuro0454u68rgMedical [place]Aundria Bing PlumesGalea Cen839607-874-ShaCornelious B22916Kara Mead1Veterinary suVerline 46LemTheora GiGe454VirgRhona Rai917-85Neuro0454u75rgMedical [place] Bing PlumesInland Eye Specialists A Medic31813 732 ShaCornelious B(93116Kara Mead1Veterinary suVerline 84Lem454VirgRhona Rai(702)40Neuro0454u47rgMedical KentuckyAund[place] Bing PlumesBradford Place Surgery And Laser Ce70781-692-ShaCornelious B(51516Kara Mead1Veterinary suVerline 49LemTheora GiGeorg454VirgRhona Rai934-02Neuro0454u42rgMedical KentuckyAundria Bing Plu538641-608-ShaCornelious B(71516Kara Mead454VirgRhona Rai(980454)33 KentuckyAund[place] Bing Plume3[facility] [phone])091-ShaCornelious B43516Kara Mead1Veterinary suVerline 74LemTheora GiGeorgeanna HarrisPrecious GilSurgicare 454VirgRhona Rai320 58Neuro0454u44rgMedical [place]Aund[place] Bing PlumesSayre Memorial H6533(769)464-ShaCornelious B24816Kara Mead1Veterinary suVerline 70LemTheora Gi454VirgRhona Rai(838) 81Neuro0454u51rgMedical60 sales representativeious GilPinecres3Roney Dan Humph2[facility] >#MOC:minipill vs mirena #Circ:  N/a, female  Jihan Rudy ROCIO 09/26/2013, 9:12 PM

## 2013-09-26 NOTE — MAU Note (Signed)
Patient presents with complaint that her membranes ruptured at 1930 today.

## 2013-09-27 ENCOUNTER — Inpatient Hospital Stay (HOSPITAL_COMMUNITY): Payer: Medicaid Other | Admitting: Anesthesiology

## 2013-09-27 ENCOUNTER — Encounter (HOSPITAL_COMMUNITY)
Admission: AD | Disposition: A | Discharge status: Home / Self Care | Payer: Self-pay | Source: Ambulatory Visit | Attending: Obstetrics & Gynecology

## 2013-09-27 ENCOUNTER — Encounter (HOSPITAL_COMMUNITY): Payer: Self-pay

## 2013-09-27 ENCOUNTER — Encounter (HOSPITAL_COMMUNITY): Payer: Medicaid Other | Admitting: Anesthesiology

## 2013-09-27 DIAGNOSIS — O4290 Premature rupture of membranes, unspecified as to length of time between rupture and onset of labor, unspecified weeks of gestation: Secondary | ICD-10-CM

## 2013-09-27 DIAGNOSIS — O411090 Infection of amniotic sac and membranes, unspecified, unspecified trimester, not applicable or unspecified: Secondary | ICD-10-CM

## 2013-09-27 LAB — RPR

## 2013-09-27 SURGERY — Surgical Case
Anesthesia: General

## 2013-09-27 MED ORDER — ONDANSETRON HCL 4 MG/2ML IJ SOLN
INTRAMUSCULAR | Status: AC
  Filled 2013-09-27: qty 2

## 2013-09-27 MED ORDER — PROPOFOL 10 MG/ML IV EMUL
INTRAVENOUS | Status: AC
  Filled 2013-09-27: qty 20

## 2013-09-27 MED ORDER — MIDAZOLAM HCL 2 MG/2ML IJ SOLN
INTRAMUSCULAR | Status: DC | PRN
  Administered 2013-09-27: 2 mg via INTRAVENOUS

## 2013-09-27 MED ORDER — FENTANYL CITRATE 0.05 MG/ML IJ SOLN
INTRAMUSCULAR | Status: AC
  Filled 2013-09-27: qty 5

## 2013-09-27 MED ORDER — LACTATED RINGERS IV SOLN
INTRAVENOUS | Status: DC | PRN
  Administered 2013-09-27 (×2): via INTRAVENOUS

## 2013-09-27 MED ORDER — ONDANSETRON HCL 4 MG/2ML IJ SOLN
INTRAMUSCULAR | Status: DC | PRN
  Administered 2013-09-27: 4 mg via INTRAVENOUS

## 2013-09-27 MED ORDER — SENNOSIDES-DOCUSATE SODIUM 8.6-50 MG PO TABS
2.0000 | ORAL_TABLET | ORAL | Status: DC
  Administered 2013-09-29: 2 via ORAL
  Filled 2013-09-27 (×2): qty 2

## 2013-09-27 MED ORDER — TERBUTALINE SULFATE 1 MG/ML IJ SOLN
INTRAMUSCULAR | Status: AC
  Administered 2013-09-27: 0.25 mg
  Filled 2013-09-27: qty 1

## 2013-09-27 MED ORDER — LACTATED RINGERS IV SOLN
INTRAVENOUS | Status: DC
  Administered 2013-09-27 – 2013-09-28 (×2): via INTRAVENOUS

## 2013-09-27 MED ORDER — PHENYLEPHRINE 40 MCG/ML (10ML) SYRINGE FOR IV PUSH (FOR BLOOD PRESSURE SUPPORT)
PREFILLED_SYRINGE | INTRAVENOUS | Status: AC
  Filled 2013-09-27: qty 5

## 2013-09-27 MED ORDER — DIPHENHYDRAMINE HCL 25 MG PO CAPS
25.0000 mg | ORAL_CAPSULE | Freq: Four times a day (QID) | ORAL | Status: DC | PRN
Start: 2013-09-27 — End: 2013-09-29

## 2013-09-27 MED ORDER — CLINDAMYCIN PHOSPHATE 900 MG/50ML IV SOLN: 900.0000 mg | Freq: Three times a day (TID) | INTRAVENOUS | Status: DC

## 2013-09-27 MED ORDER — MEPERIDINE HCL 25 MG/ML IJ SOLN
INTRAMUSCULAR | Status: AC
  Filled 2013-09-27: qty 1

## 2013-09-27 MED ORDER — MEPERIDINE HCL 25 MG/ML IJ SOLN
INTRAMUSCULAR | Status: DC | PRN
  Administered 2013-09-27 (×2): 12.5 mg via INTRAVENOUS

## 2013-09-27 MED ORDER — MEPERIDINE HCL 25 MG/ML IJ SOLN
6.2500 mg | INTRAMUSCULAR | Status: DC | PRN
  Administered 2013-09-27: 6.25 mg via INTRAVENOUS

## 2013-09-27 MED ORDER — DIPHENHYDRAMINE HCL 12.5 MG/5ML PO ELIX
12.5000 mg | ORAL_SOLUTION | Freq: Four times a day (QID) | ORAL | Status: DC | PRN
  Filled 2013-09-27: qty 5

## 2013-09-27 MED ORDER — ZOLPIDEM TARTRATE 5 MG PO TABS
5.0000 mg | ORAL_TABLET | Freq: Every evening | ORAL | Status: DC | PRN
  Administered 2013-09-27: 5 mg via ORAL
  Filled 2013-09-27: qty 1

## 2013-09-27 MED ORDER — LACTATED RINGERS IV SOLN
INTRAVENOUS | Status: DC
Start: 2013-09-27 — End: 2013-09-27

## 2013-09-27 MED ORDER — OXYTOCIN 40 UNITS IN LACTATED RINGERS INFUSION - SIMPLE MED: 62.5000 mL/h | INTRAVENOUS | Status: AC

## 2013-09-27 MED ORDER — GENTAMICIN SULFATE 40 MG/ML IJ SOLN
Freq: Three times a day (TID) | INTRAVENOUS | Status: AC
  Administered 2013-09-27 – 2013-09-28 (×3): via INTRAVENOUS
  Filled 2013-09-27 (×3): qty 3.25

## 2013-09-27 MED ORDER — ONDANSETRON HCL 4 MG/2ML IJ SOLN: 4.0000 mg | INTRAMUSCULAR | Status: DC | PRN

## 2013-09-27 MED ORDER — HYDROMORPHONE HCL PF 1 MG/ML IJ SOLN
INTRAMUSCULAR | Status: AC
  Administered 2013-09-27: 0.25 mg via INTRAVENOUS
  Filled 2013-09-27: qty 1

## 2013-09-27 MED ORDER — OXYCODONE-ACETAMINOPHEN 5-325 MG PO TABS
1.0000 | ORAL_TABLET | ORAL | Status: DC | PRN
  Administered 2013-09-28 – 2013-09-29 (×3): 1 via ORAL
  Filled 2013-09-27 (×3): qty 1

## 2013-09-27 MED ORDER — FENTANYL CITRATE 0.05 MG/ML IJ SOLN
100.0000 ug | INTRAMUSCULAR | Status: DC | PRN
  Administered 2013-09-27: 100 ug via INTRAVENOUS
  Filled 2013-09-27: qty 2

## 2013-09-27 MED ORDER — SIMETHICONE 80 MG PO CHEW
80.0000 mg | CHEWABLE_TABLET | Freq: Three times a day (TID) | ORAL | Status: DC
  Administered 2013-09-27 – 2013-09-28 (×5): 80 mg via ORAL
  Filled 2013-09-27 (×4): qty 1

## 2013-09-27 MED ORDER — FENTANYL CITRATE 0.05 MG/ML IJ SOLN
INTRAMUSCULAR | Status: DC | PRN
  Administered 2013-09-27: 250 ug via INTRAVENOUS
  Administered 2013-09-27 (×2): 50 ug via INTRAVENOUS

## 2013-09-27 MED ORDER — PRENATAL MULTIVITAMIN CH
1.0000 | ORAL_TABLET | Freq: Every day | ORAL | Status: DC
  Administered 2013-09-27 – 2013-09-28 (×2): 1 via ORAL
  Filled 2013-09-27 (×2): qty 1

## 2013-09-27 MED ORDER — SCOPOLAMINE 1 MG/3DAYS TD PT72
MEDICATED_PATCH | TRANSDERMAL | Status: AC
  Filled 2013-09-27: qty 1

## 2013-09-27 MED ORDER — ONDANSETRON HCL 4 MG PO TABS: 4.0000 mg | ORAL_TABLET | ORAL | Status: DC | PRN

## 2013-09-27 MED ORDER — PROPOFOL 10 MG/ML IV BOLUS
INTRAVENOUS | Status: DC | PRN
  Administered 2013-09-27: 200 mg via INTRAVENOUS

## 2013-09-27 MED ORDER — DIBUCAINE 1 % RE OINT: 1.0000 "application " | TOPICAL_OINTMENT | RECTAL | Status: DC | PRN

## 2013-09-27 MED ORDER — ONDANSETRON HCL 4 MG/2ML IJ SOLN: 4.0000 mg | Freq: Four times a day (QID) | INTRAMUSCULAR | Status: DC | PRN

## 2013-09-27 MED ORDER — PHENYLEPHRINE HCL 10 MG/ML IJ SOLN
INTRAMUSCULAR | Status: DC | PRN
  Administered 2013-09-27 (×2): 80 ug via INTRAVENOUS

## 2013-09-27 MED ORDER — SIMETHICONE 80 MG PO CHEW: 80.0000 mg | CHEWABLE_TABLET | ORAL | Status: DC | PRN

## 2013-09-27 MED ORDER — PROMETHAZINE HCL 25 MG/ML IJ SOLN: 6.2500 mg | INTRAMUSCULAR | Status: DC | PRN

## 2013-09-27 MED ORDER — HYDROMORPHONE HCL PF 1 MG/ML IJ SOLN
0.2500 mg | INTRAMUSCULAR | Status: DC | PRN
  Administered 2013-09-27: 0.25 mg via INTRAVENOUS

## 2013-09-27 MED ORDER — DIPHENHYDRAMINE HCL 50 MG/ML IJ SOLN: 12.5000 mg | Freq: Four times a day (QID) | INTRAMUSCULAR | Status: DC | PRN

## 2013-09-27 MED ORDER — OXYTOCIN 10 UNIT/ML IJ SOLN
40.0000 [IU] | INTRAVENOUS | Status: DC | PRN
  Administered 2013-09-27: 40 [IU] via INTRAVENOUS

## 2013-09-27 MED ORDER — SUCCINYLCHOLINE CHLORIDE 20 MG/ML IJ SOLN
INTRAMUSCULAR | Status: AC
  Filled 2013-09-27: qty 10

## 2013-09-27 MED ORDER — MISOPROSTOL 200 MCG PO TABS
ORAL_TABLET | ORAL | Status: DC | PRN
  Administered 2013-09-27: 1000 ug via RECTAL

## 2013-09-27 MED ORDER — SUCCINYLCHOLINE CHLORIDE 20 MG/ML IJ SOLN
INTRAMUSCULAR | Status: DC | PRN
  Administered 2013-09-27: 100 mg via INTRAVENOUS

## 2013-09-27 MED ORDER — WITCH HAZEL-GLYCERIN EX PADS: 1.0000 "application " | MEDICATED_PAD | CUTANEOUS | Status: DC | PRN

## 2013-09-27 MED ORDER — CEFAZOLIN SODIUM-DEXTROSE 2-3 GM-% IV SOLR
INTRAVENOUS | Status: DC | PRN
  Administered 2013-09-27: 2 g via INTRAVENOUS

## 2013-09-27 MED ORDER — SIMETHICONE 80 MG PO CHEW
80.0000 mg | CHEWABLE_TABLET | ORAL | Status: DC
  Administered 2013-09-28 – 2013-09-29 (×2): 80 mg via ORAL
  Filled 2013-09-27 (×2): qty 1

## 2013-09-27 MED ORDER — MIDAZOLAM HCL 2 MG/2ML IJ SOLN
INTRAMUSCULAR | Status: AC
  Filled 2013-09-27: qty 2

## 2013-09-27 MED ORDER — IBUPROFEN 600 MG PO TABS
600.0000 mg | ORAL_TABLET | Freq: Four times a day (QID) | ORAL | Status: DC
  Administered 2013-09-27 – 2013-09-29 (×8): 600 mg via ORAL
  Filled 2013-09-27 (×8): qty 1

## 2013-09-27 MED ORDER — PROMETHAZINE HCL 25 MG/ML IJ SOLN: 25.0000 mg | Freq: Four times a day (QID) | INTRAMUSCULAR | Status: DC | PRN

## 2013-09-27 MED ORDER — TETANUS-DIPHTH-ACELL PERTUSSIS 5-2.5-18.5 LF-MCG/0.5 IM SUSP
0.5000 mL | Freq: Once | INTRAMUSCULAR | Status: DC
Start: 2013-09-28 — End: 2013-09-29

## 2013-09-27 MED ORDER — OXYCODONE-ACETAMINOPHEN 5-325 MG PO TABS: 2.0000 | ORAL_TABLET | ORAL | Status: DC | PRN

## 2013-09-27 MED ORDER — ZOLPIDEM TARTRATE 5 MG PO TABS: 5.0000 mg | ORAL_TABLET | Freq: Every evening | ORAL | Status: DC | PRN

## 2013-09-27 MED ORDER — LANOLIN HYDROUS EX OINT: 1.0000 "application " | TOPICAL_OINTMENT | CUTANEOUS | Status: DC | PRN

## 2013-09-27 MED ORDER — SODIUM CHLORIDE 0.9 % IJ SOLN: 9.0000 mL | INTRAMUSCULAR | Status: DC | PRN

## 2013-09-27 MED ORDER — NALOXONE HCL 0.4 MG/ML IJ SOLN: 0.4000 mg | INTRAMUSCULAR | Status: DC | PRN

## 2013-09-27 MED ORDER — MISOPROSTOL 200 MCG PO TABS
ORAL_TABLET | ORAL | Status: AC
  Filled 2013-09-27: qty 5

## 2013-09-27 MED ORDER — HYDROMORPHONE 0.3 MG/ML IV SOLN
INTRAVENOUS | Status: DC
  Administered 2013-09-27: 08:00:00 via INTRAVENOUS
  Administered 2013-09-27: 0.3 mg via INTRAVENOUS
  Administered 2013-09-27 – 2013-09-28 (×2): 1.2 mg via INTRAVENOUS
  Administered 2013-09-28: 0.6 mg via INTRAVENOUS
  Filled 2013-09-27: qty 25

## 2013-09-27 MED ORDER — METHYLERGONOVINE MALEATE 0.2 MG/ML IJ SOLN
INTRAMUSCULAR | Status: DC | PRN
  Administered 2013-09-27: 0.2 mg via INTRAMUSCULAR

## 2013-09-27 MED ORDER — OXYTOCIN 10 UNIT/ML IJ SOLN
INTRAMUSCULAR | Status: AC
  Filled 2013-09-27: qty 4

## 2013-09-27 MED ORDER — MENTHOL 3 MG MT LOZG: 1.0000 | LOZENGE | OROMUCOSAL | Status: DC | PRN

## 2013-09-27 SURGICAL SUPPLY — 32 items
BENZOIN TINCTURE PRP APPL 2/3 (GAUZE/BANDAGES/DRESSINGS) ×3 IMPLANT
BLADE SURG 10 STRL SS (BLADE) ×6 IMPLANT
CLAMP CORD UMBIL (MISCELLANEOUS) IMPLANT
CLOSURE STERI STRIP 1/2 X4 (GAUZE/BANDAGES/DRESSINGS) ×3 IMPLANT
CLOTH BEACON ORANGE TIMEOUT ST (SAFETY) ×3 IMPLANT
DRAIN JACKSON PRT FLT 7MM (DRAIN) IMPLANT
DRAPE LG THREE QUARTER DISP (DRAPES) IMPLANT
DRSG OPSITE POSTOP 4X10 (GAUZE/BANDAGES/DRESSINGS) ×3 IMPLANT
DURAPREP 26ML APPLICATOR (WOUND CARE) ×3 IMPLANT
ELECT REM PT RETURN 9FT ADLT (ELECTROSURGICAL) ×3
ELECTRODE REM PT RTRN 9FT ADLT (ELECTROSURGICAL) ×1 IMPLANT
EVACUATOR SILICONE 100CC (DRAIN) IMPLANT
EXTRACTOR VACUUM M CUP 4 TUBE (SUCTIONS) IMPLANT
EXTRACTOR VACUUM M CUP 4' TUBE (SUCTIONS)
GLOVE BIO SURGEON STRL SZ7 (GLOVE) ×3 IMPLANT
GLOVE BIOGEL PI IND STRL 7.0 (GLOVE) ×1 IMPLANT
GLOVE BIOGEL PI INDICATOR 7.0 (GLOVE) ×2
GOWN STRL REUS W/TWL LRG LVL3 (GOWN DISPOSABLE) ×6 IMPLANT
KIT ABG SYR 3ML LUER SLIP (SYRINGE) IMPLANT
NEEDLE HYPO 25X5/8 SAFETYGLIDE (NEEDLE) ×3 IMPLANT
NS IRRIG 1000ML POUR BTL (IV SOLUTION) ×3 IMPLANT
PACK C SECTION WH (CUSTOM PROCEDURE TRAY) ×3 IMPLANT
PAD OB MATERNITY 4.3X12.25 (PERSONAL CARE ITEMS) ×3 IMPLANT
RETRACTOR WND ALEXIS 25 LRG (MISCELLANEOUS) ×1 IMPLANT
RTRCTR C-SECT PINK 25CM LRG (MISCELLANEOUS) ×3 IMPLANT
RTRCTR WOUND ALEXIS 25CM LRG (MISCELLANEOUS) ×3
SUT VIC AB 0 CTX 36 (SUTURE) ×10
SUT VIC AB 0 CTX36XBRD ANBCTRL (SUTURE) ×5 IMPLANT
SUT VIC AB 4-0 KS 27 (SUTURE) ×3 IMPLANT
TOWEL OR 17X24 6PK STRL BLUE (TOWEL DISPOSABLE) ×3 IMPLANT
TRAY FOLEY CATH 14FR (SET/KITS/TRAYS/PACK) ×3 IMPLANT
WATER STERILE IRR 1000ML POUR (IV SOLUTION) ×3 IMPLANT

## 2013-09-27 NOTE — Progress Notes (Signed)
Attestation of Attending Supervision of Fellow: Evaluation and management procedures were performed by the Fellow under my supervision and collaboration.  I have reviewed the Fellow's note and chart, and I agree with the management and plan.    

## 2013-09-27 NOTE — Progress Notes (Signed)
ANTIBIOTIC CONSULT NOTE - INITIAL  Pharmacy Consult for Gentamicin Indication: Chorioamnionitis   Allergies  Allergen Reactions  . Sulfa Antibiotics Hives and Rash    Patient Measurements: Height:  (157.5 cm) Weight: 180 lb (81.647 kg) IBW/kg (Calculated) : 50.1 Adjusted Body Weight: 59.6 kg  Vital Signs: Temp: 99.6 F (37.6 C) (09/07 0655) Temp src: Oral (09/07 0359) BP: 123/71 mmHg (09/07 0655) Pulse Rate: 77 (09/07 0655)  Labs:  Recent Labs  09/26/13 2120  WBC 8.9  HGB 10.9*  PLT 244     Microbiology: Recent Results (from the past 720 hour(s))  OB RESULTS CONSOLE GBS     Status: None   Collection Time    09/06/13 12:00 AM      Result Value Ref Range Status   GBS Negative   Final  CULTURE, BETA STREP (GROUP B ONLY)     Status: None   Collection Time    09/06/13  8:39 AM      Result Value Ref Range Status   Organism ID, Bacteria NO GROUP B STREP (S.[email or web address]) ISOLATED   Final    Medications:  Clindamycin 900 mg IV Q8h  Assessment: 18 y.o. female G1P1001 at [redacted]w[redacted]d s/p c-section this morning being initiated on gentamicin and clindamycin for chorio.  Estimated Ke = 0.304, Vd = 20.9L  Goal of Therapy:  Gentamicin peak 6-8 mg/L and Trough < 1 mg/L  Plan:  Gentamicin 130 mg IV every 8 hrs  Check Scr with next labs if gentamicin continued. Will check gentamicin levels if continued > 72hr or clinically indicated.  Lenore Manner [place] 09/27/2013,8:09 AM

## 2013-09-27 NOTE — Anesthesia Postprocedure Evaluation (Signed)
  Anesthesia Post-op Note  Patient: Sandra Harding  Procedure(s) Performed: Procedure(s) (LRB): CESAREAN SECTION (N/A)  Patient Location: PACU  Anesthesia Type: General  Level of Consciousness: awake and alert   Airway and Oxygen Therapy: Patient Spontanous Breathing  Post-op Pain: mild  Post-op Assessment: Post-op Vital signs reviewed, Patient's Cardiovascular Status Stable, Respiratory Function Stable, Patent Airway and No signs of Nausea or vomiting  Last Vitals:  Filed Vitals:   09/27/13 0615  BP:   Pulse: 97  Temp:   Resp: 22    Post-op Vital Signs: stable   Complications: No apparent anesthesia complications

## 2013-09-27 NOTE — Progress Notes (Signed)
Ur chart review completed.  

## 2013-09-27 NOTE — Anesthesia Procedure Notes (Signed)
Procedure Name: Intubation Performed by: Phillips Grout Pre-anesthesia Checklist: Patient identified, Emergency Drugs available, Suction available and Patient being monitored Patient Re-evaluated:Patient Re-evaluated prior to inductionOxygen Delivery Method: Circle system utilized Preoxygenation: Pre-oxygenation with 100% oxygen Intubation Type: IV induction, Rapid sequence and Cricoid Pressure applied Laryngoscope Size: Mac and 3 (Glidescope) Grade View: Grade I Tube type: Glide rite Tube size: 7.5 mm Number of attempts: 1 Airway Equipment and Method: Video-laryngoscopy Placement Confirmation: ETT inserted through vocal cords under direct vision and positive ETCO2 Secured at: 20 cm Tube secured with: Tape Dental Injury: Teeth and Oropharynx as per pre-operative assessment

## 2013-09-27 NOTE — Anesthesia Postprocedure Evaluation (Signed)
Anesthesia Post Note  Patient: Sandra Harding  Procedure(s) Performed: Procedure(s) (LRB): CESAREAN SECTION (N/A)  Anesthesia type: General  Patient location: Mother/Baby  Post pain: Pain level controlled  Post assessment: Post-op Vital signs reviewed  Last Vitals:  Filed Vitals:   09/27/13 0655  BP: 123/71  Pulse: 77  Temp: 37.6 C  Resp: 16    Post vital signs: Reviewed  Level of consciousness: awake and alert   Complications: No apparent anesthesia complications

## 2013-09-27 NOTE — Progress Notes (Signed)
Delayed entry  Called to room 2/2 prolonged deceleration to 90s.  Patient last received cytotec at 2144, received fentanyl at 0252.  O2 nonrebreather on when I entered room, FSE placed, terbutaline given with no improvement.  Foley balloon removed.  AROM clear with FSE.  Dr. Penne Lash called to room, patient consented for STAT cesarean section.    Dilation: 3.5 Effacement (%): 80 Cervical Position: Posterior Station: 0 Presentation: Vertex Exam by:: Dr. Loreta Ave  The risks of cesarean section discussed with the patient included but were not limited to: bleeding which may require transfusion or reoperation; infection which may require antibiotics; injury to bowel, bladder, ureters or other surrounding organs; injury to the fetus; need for additional procedures including hysterectomy in the event of a life-threatening hemorrhage; placental abnormalities wth subsequent pregnancies, incisional problems, thromboembolic phenomenon and other postoperative/anesthesia complications. The patient concurred with the proposed plan, giving informed written consent for the procedure.  Patient will remain NPO for procedure. Anesthesia and OR aware. Preoperative prophylactic antibiotics and SCDs ordered on call to the OR.  Perry Mount, MD

## 2013-09-27 NOTE — Addendum Note (Signed)
Addendum created 09/27/13 0816 by Jhonnie Garner, CRNA   Modules edited: Notes Section   Notes Section:  File: [id number]

## 2013-09-27 NOTE — Transfer of Care (Signed)
Immediate Anesthesia Transfer of Care Note  Patient: Sandra Harding  Procedure(s) Performed: Procedure(s): CESAREAN SECTION (N/A)  Patient Location: PACU  Anesthesia Type:General  Level of Consciousness: sedated  Airway & Oxygen Therapy: Patient Spontanous Breathing and Patient connected to nasal cannula oxygen  Post-op Assessment: Report given to PACU RN and Post -op Vital signs reviewed and stable  Post vital signs: Reviewed and stable  Complications: No apparent anesthesia complications

## 2013-09-27 NOTE — Op Note (Signed)
[facility] C Misch[facility] PROCEDURE DATE: 09/26/2013 - 09/27/2013  PREOPERATIVE DIAGNOSIS: Intrauterine pregnancy at  [redacted]w[redacted]d weeks gestation with non reassuring fetal heart tracing  POSTOPERATIVE DIAGNOSIS: Intrauterine pregnancy at  [redacted]w[redacted]d weeks gestation with non reassuring fetal heart tracing  PROCEDURE:    Low Transverse Cesarean Section  SURGEON:  Dr. [name]  ASSISTANT: Dr. Burnetta Sabin  INDICATIONS: Sandra Harding is a 18 y.o. G1P0 at [redacted]w[redacted]d who was admitted for spontaneous rupture of membranes.  During the augmentation of labor fetus experiences a prolonged bradycardia which required emergent cesarean section.  The risks of cesarean section discussed with the patient included but were not limited to: bleeding which may require transfusion or reoperation; infection which may require antibiotics; injury to bowel, bladder, ureters or other surrounding organs; injury to the fetus; need for additional procedures including hysterectomy in the event of a life-threatening hemorrhage; placental abnormalities wth subsequent pregnancies, incisional problems, thromboembolic phenomenon and other postoperative/anesthesia complications. The patient concurred with the proposed plan, giving informed written consent for the procedure.    FINDINGS:  Viable female  infant in cephalic presentation, nuchal cord x1, 8,9 Apgars, weight to be determined in 1 hour, clear amniotic fluid.  Intact placenta, three vessel cord.  Grossly normal uterus, ovaries and fallopian tubes.  Placenta was noted to be small and cord was thin. .   ANESTHESIA:    General   ESTIMATED BLOOD LOSS: 800cc  SPECIMENS: Placenta sent to Pathology  COMPLICATIONS: None immediate  PROCEDURE IN DETAIL:  The patient received intravenous antibiotics and had sequential compression devices applied to her lower extremities.  Patient was prepared with betadine quickly and prepared in a sterile fashion.  Foley was placed in the bladder.  General  anesthesia was induced.  After an adequate timeout was performed, a Pfannenstiel skin incision was made with scalpel and carried through to the underlying layer of fascia. The fascia was incised in the midline and this incision was extended bilaterally using the Mayo scissors. Kocher clamps were applied to the superior aspect of the fascial incision and the underlying rectus muscles were dissected off bluntly. A similar process was carried out on the inferior aspect of the facial incision. The rectus muscles were separated in the midline bluntly and the peritoneum was entered bluntly.   A transverse hysterotomy was made with a scalpel and extended bilaterally bluntly. The bladder blade was then removed. The infant was successfully delivered, and cord was clamped and cut and infant was handed over to awaiting neonatology team. Uterine massage was then administered and the placenta delivered intact with three-vessel cord. The uterus was cleared of clot and debris.  The hysterotomy was closed with 0 vicryl.  A second imbricating suture of 0-Vicryl was used to reinforce the incision and aid in hemostasis.  The peritoneum and rectus muscles were noted to be hemostatic.  The fascia was closed with 0-Vicryl in a running fashion with good restoration of anatomy.  The subcutaneus tissue was copiously irrigated.  The skin was closed with 4-0 Vicryl in a subcuticular fashion.  There was moderate uterine atony which required methergine and cytotec.  Pt tolerated the procedure will.  All counts were correct x2.  Pt went to the recovery room in stable condition.

## 2013-09-27 NOTE — Progress Notes (Signed)
LABOR PROGRESS NOTE  Sandra Harding is a 18 y.o. G1P0 at [redacted]w[redacted]d  admitted for rupture of membranes  Subjective: Comfortable, feels like contractions are getting stronger  Objective: BP 140/80  Pulse 67  Temp(Src) 97.7 F (36.5 C) (Oral)  Resp 18  Ht  (1.575 m)  Wt 180 lb (81.647 kg)  BMI 32.91 kg/m2 or  Filed Vitals:   09/26/13 2117 09/26/13 2128 09/26/13 2320 09/27/13 0159  BP: 135/81  122/80 140/80  Pulse: 80  59 67  Temp: 98.3 F (36.8 C)   97.7 F (36.5 C)  TempSrc: Oral   Oral  Resp: Height:   (1.575 m)    Weight:  180 lb (81.647 kg)         FHT:    145, moderate variability, +accels, no decels UC:   irregular, every 2-5 minutes SVE:   Dilation: 1.5 Effacement (%): 80 Station: -1 Exam by:: Dr. Loreta Ave  Dilation: 1.5 Effacement (%): 80 Cervical Position: Posterior Station: -1 Presentation: Vertex Exam by:: Dr. Loreta Ave  Balloon placed with 60cc at 0230  Labs: Lab Results  Component Value Date   WBC 8.9 09/26/2013   HGB 10.9* 09/26/2013   HCT 32.6* 09/26/2013   MCV 89.1 09/26/2013   PLT 244 09/26/2013    Assessment / Plan: Augmentation of labor, progressing well  Labor: Progressing normally Fetal Wellbeing:  Category I Pain Control:  Labor support without medications Anticipated MOD:  NSVD ambien ordered to help patient sleep, may also have fentanyl  prn pain, will receive next dose of cytotec when able, last dose at 2144  Perry Mount, MD 09/27/2013, 2:46 AM a

## 2013-09-27 NOTE — Progress Notes (Signed)
Clinical Social Work Department PSYCHOSOCIAL ASSESSMENT - MATERNAL/CHILD 09/27/2013  Patient:  Sandra Harding,Sandra Harding  Account Number:  [id number]  Admit Date:  09/26/2013  Childs Name:   Sandra Harding   Clinical Social Worker:  Tylen Leverich, CLINICAL SOCIAL WORKER   Date/Time:  09/27/2013 02:00 PM  Date Referred:  09/27/2013   Referral source  Central Nursery     Referred reason  Substance Abuse   Other referral source:    I:  FAMILY / HOME ENVIRONMENT Child's legal guardian:  PARENT  Guardian - Name Guardian - Age Guardian - Address  Sandra Harding 18 [address] [place], [place] [address]  Sandra Harding  Same as above   Other household support members/support persons Other support:   MOB shared that she is well supported by all of their family members.  MGM present for assessment and confirmed that she will be moving in for a few weeks to assist with the transition home.  MGM shared that she is within short distance of the family and is available as needed.    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   Financial resources:  Medicaid If Medicaid - County:  [place] Other  Food Stamps  WIC   School / Grade:  N/A Maternity Care Coordinator / Child Services Coordination / Early Interventions:   N/A  Cultural issues impacting care:   None reported    III  STRENGTHS Strengths  Adequate Resources  Supportive family/friends  Home prepared for Child (including basic supplies)   Strength comment:    IV  RISK FACTORS AND CURRENT PROBLEMS Current Problem:  YES   Risk Factor & Current Problem Patient Issue Family Issue Risk Factor / Current Problem Comment  Substance Abuse Y N MOB reported THC during early stages of pregnancy.  She shared that she only used until she knew that she was pregant.   MOB had positive UDS for THC in March.  Baby's UDS  negative, meconium result is pending.    V  SOCIAL WORK ASSESSMENT CSW met with MOB in order to complete assessment in her room.  Consult ordered due to MOB presenting with history of THC use during pregnancy.  MOB had multiple visitors when CSW arrived including her parents, brother, and cousin.  CSW suggested completing assessment at different time, but MOB stated that it was okay to complete assessment at that time.  CSW inquired about comfort level completing assessment with family present, and MOB denied any barriers to accurately completing assessment.  MOB was easily engaged in the assessment and was polite throughout; however, CSW noted that it was difficult for MOB to concentrate due to large number of family members who were talking loudly.  She was receptive to the assessment and thanked CSW for education on postpartum depression.  MOB presented as tired and drowsy by the end of the assessment as CSW noted that it was difficult for MOB to keep eyes open as her eye lids became more heavy.  CSW was able to complete assessment prior to MOB presenting as tired with difficulties concentrating.    Per MOB, she feels well supported and excited as she becomes a first time mother.  MGM present at MOB's bedside and stated that she will be helping MOB at home due to FOB going back to work next week. She confirmed that the house is prepared.    CSW explored any potential anxieties related to becoming a first time mother.  MOB denied any anxiety due to large support system.  CSW normalized anxiety since becoming a parent is overwhelming, but MOB continued to smile and minimize any potential anxiety.  MOB denied presence of other psychosocial stressors, and stated that she feels like her life is stable.   CSW provided education on postpartum depression as MOB denied any previous education.  MGM and MOB maintained eye contact as CSW discussed signs and symptoms.  MOB reported willingness to reach out to family and  medical providers if symptoms occur.  CSW inquired about previous mental health history due to association between prior mental health history and the development of postpartum depression.  MOB denied mental health history.    MOB acknowledged reason for CSW consult.  Per MOB, she only smoked THC until she learned that she was pregnant.  She stated that she has not used since February/March, and denied questions or concerns related to the newborn drug screen policy.  MOB aware that CPS will be contacted if meconium is positive.  She denied any concerns and expressed confidence that meconium drug screen will be negative.   No barriers to discharge.    VI SOCIAL WORK PLAN Social Work Plan  Patient/Family Education  No Further Intervention Required / No Barriers to Discharge   Type of pt/family education:   Postpartum depression and anxiety  Hospital drug screen policy   If child protective services report - county:   If child protective services report - date:   Information/referral to community resources comment:   Other social work plan:   CSW to monitor meconium drug screen result and will make CPS report if positive.     

## 2013-09-27 NOTE — Anesthesia Preprocedure Evaluation (Signed)
Anesthesia Evaluation  Patient identified by MRN, date of birth, ID band Patient awake    Reviewed: Allergy & Precautions, H&P , NPO status , Patient's Chart, lab work & pertinent test results  History of Anesthesia Complications Negative for: history of anesthetic complications  Airway Mallampati: II TM Distance: >3 FB Neck ROM: full    Dental no notable dental hx. (+) Teeth Intact   Pulmonary neg pulmonary ROS,  breath sounds clear to auscultation  Pulmonary exam normal       Cardiovascular negative cardio ROS  Rhythm:regular Rate:Normal     Neuro/Psych negative neurological ROS  negative psych ROS   GI/Hepatic negative GI ROS, Neg liver ROS,   Endo/Other  negative endocrine ROS  Renal/GU negative Renal ROS  negative genitourinary   Musculoskeletal   Abdominal Normal abdominal exam  (+)   Peds  Hematology negative hematology ROS (+)   Anesthesia Other Findings   Reproductive/Obstetrics (+) Pregnancy                           Anesthesia Physical Anesthesia Plan  ASA: II and emergent  Anesthesia Plan: General ETT, Rapid Sequence and Cricoid Pressure   Post-op Pain Management:    Induction: Intravenous  Airway Management Planned: Video Laryngoscope Planned  Additional Equipment:   Intra-op Plan:   Post-operative Plan: Extubation in OR  Informed Consent: I have reviewed the patients History and Physical, chart, labs and discussed the procedure including the risks, benefits and alternatives for the proposed anesthesia with the patient or authorized representative who has indicated his/her understanding and acceptance.   Dental advisory given  Plan Discussed with:   Anesthesia Plan Comments: (Prolonged fetal deceleration of ongoing. GA RSI)        Anesthesia Quick Evaluation

## 2013-09-27 NOTE — H&P (Signed)
Attestation of Attending Supervision of Fellow: Evaluation and management procedures were performed by the Fellow under my supervision and collaboration.  I have reviewed the Fellow's note and chart, and I agree with the management and plan.    

## 2013-09-28 ENCOUNTER — Encounter: Payer: Self-pay | Admitting: Obstetrics & Gynecology

## 2013-09-28 ENCOUNTER — Encounter (HOSPITAL_COMMUNITY): Payer: Self-pay | Admitting: Obstetrics & Gynecology

## 2013-09-28 LAB — CBC
HCT: 25.5 % — ABNORMAL LOW (ref 36.0–46.0)
HEMOGLOBIN: 8.4 g/dL — AB (ref 12.0–15.0)
MCH: 29.8 pg (ref 26.0–34.0)
MCHC: 32.9 g/dL (ref 30.0–36.0)
MCV: 90.4 fL (ref 78.0–100.0)
Platelets: 187 10*3/uL (ref 150–400)
RBC: 2.82 MIL/uL — ABNORMAL LOW (ref 3.87–5.11)
RDW: 12.8 % (ref 11.5–15.5)
WBC: 7.8 10*3/uL (ref 4.0–10.5)

## 2013-09-28 NOTE — Lactation Note (Signed)
This note was copied from the chart of Sandra Harding. Lactation Consultation Note  Patient Name: Sandra Harding Today's Date: 09/28/2013 Reason for consult: Follow-up assessment;Infant < 6lbs;Difficult latch  Baby 31 hours of life. Mom reports baby harder to latch to left side as nipple is somewhat flatter on left breast than right. Mom states that she is seeing lots of colostrum. Enc mom to hand express colostrum prior to latching baby to enc baby to open wide to latch on. Mom states baby nurses 15-30 minutes when baby nurses, but baby does fall asleep at breast. Enc mom to compress breast while baby nursing to direct more colostrum to baby. Also discussed possibility of hand expressing and supplementing EBM. Enc mom to call out for assistance latching at next breast feed for assistance latching to left breast. Maternal Data    Feeding Feeding Type: Breast Fed Length of feed: 0 min  LATCH Score/Interventions                      Lactation Tools Discussed/Used     Consult Status Consult Status: PRN    Geralynn Ochs 09/28/2013, 11:53 AM

## 2013-09-28 NOTE — Progress Notes (Signed)
I have seen and examined this patient and I agree with the above. Cam Hai CNM 8:09 AM 09/28/2013

## 2013-09-28 NOTE — Lactation Note (Signed)
This note was copied from the chart of Sandra Harding. Lactation Consultation Note  Patient Name: Sandra Harding Today's Date: 09/28/2013 Reason for consult: Follow-up assessment;Difficult latch   LC provided latch assistance on more difficult (L) side and baby latched well after mom switched to cross-cradle position and re-positioned baby closer "tummy-to-tummy". Rhythmical sucking bursts and swallows noted.  Baby had just finished nursing 15 minutes on this left breast and nipple is everted and no signs of trauma.  LC encouraged the cross-cradle position for greater visibility of baby during latching and feeding.   Maternal Data    Feeding Feeding Type: Breast Fed  LATCH Score/Interventions Latch: Grasps breast easily, tongue down, lips flanged, rhythmical sucking. Intervention(s): Adjust position;Assist with latch;Breast compression  Audible Swallowing: Spontaneous and intermittent Intervention(s): Skin to skin;Hand expression Intervention(s): Skin to skin;Hand expression;Alternate breast massage  Type of Nipple: Everted at rest and after stimulation  Comfort (Breast/Nipple): Soft / non-tender     Hold (Positioning): Assistance needed to correctly position infant at breast and maintain latch. Intervention(s): Breastfeeding basics reviewed;Support Pillows;Position options;Skin to skin  LATCH Score: 9  Lactation Tools Discussed/Used   Cue feedings, cross-cradle position and signs of proper latch  Consult Status Consult Status: Follow-up Date: 09/29/13 Follow-up type: In-patient    Warrick Parisian [facility] 09/28/2013, 7:01 PM

## 2013-09-28 NOTE — Progress Notes (Signed)
Post Op Day #1 pLTCS Subjective: no complaints, voiding, tolerating PO and + flatus  Would like PCA discontinued   Objective: Blood pressure 102/43, pulse 62, temperature 98 F (36.7 C), temperature source Oral, resp. rate 18, height  (1.575 m), weight 81.647 kg (180 lb), SpO2 96.00%, unknown if currently breastfeeding.  Physical Exam:  General: alert, cooperative and no distress Lochia: appropriate Uterine Fundus: firm Incision: no significant drainage DVT Evaluation: No evidence of DVT seen on physical exam. Negative Homan's sign. No cords or calf tenderness.   Recent Labs  09/26/13 2120 09/28/13 0627  HGB 10.9* 8.4*  HCT 32.6* 25.5*    Assessment/Plan: Plan for discharge tomorrow, Breastfeeding and Contraception mini pill   LOS: 2 days   Joanna Puff 09/28/2013, 8:01 AM

## 2013-09-29 ENCOUNTER — Encounter: Payer: Medicaid Other | Admitting: Obstetrics and Gynecology

## 2013-09-29 MED ORDER — OXYCODONE-ACETAMINOPHEN 5-325 MG PO TABS: 1.0000 | ORAL_TABLET | Freq: Four times a day (QID) | ORAL | Status: DC | PRN

## 2013-09-29 MED ORDER — IBUPROFEN 600 MG PO TABS: 600.0000 mg | ORAL_TABLET | Freq: Four times a day (QID) | ORAL | Status: DC

## 2013-09-29 NOTE — Discharge Instructions (Signed)
Please seek immediate medical attention for severe vaginal bleeding, fever above 101.4, or abnormal drainage or opening of incision.

## 2013-09-29 NOTE — Lactation Note (Signed)
This note was copied from the chart of Sandra Harding. Lactation Consultation Note  Patient Name: Sandra Hoa Stamos Today's Date: 09/29/2013 Reason for consult: Follow-up assessment Per mom sometimes the baby feeds 5 mins and then sometimes feeds 30 mins.  Baby rooting , LC assisted with latch on the right breast , worked on positioning and depth.  Per mom initially tender , once depth obtained - per mom comfort able.  Baby fed 7 mins and released due to a wet diaper , changed wet , and re-latched on the same breast ( right )  With depth and was still feeding at 13 mins in a consistent swallowing pattern. Discussed prevention and tx of sore nipple and engorgement , referring to the baby and me booklet pg 24. Mother informed of post-discharge support and given phone number to the lactation department, including services for phone  call assistance; out-patient appointments; and breastfeeding support group. List of other breastfeeding resources in the community  given in the handout. Encouraged mother to call for problems or concerns related to breastfeeding. Per mom has a 9 am apt with Rona Ravens in the am tomorrow for lactation .     Maternal Data Has patient been taught Hand Expression?: Yes Does the patient have breastfeeding experience prior to this delivery?: No  Feeding Feeding Type: Breast Fed (relatched  after wet diaper ) Length of feed:  (still feeding at 13 mins in a consistent pattern )  LATCH Score/Interventions Latch: Grasps breast easily, tongue down, lips flanged, rhythmical sucking. Intervention(s): Skin to skin;Teach feeding cues;Waking techniques Intervention(s): Adjust position;Assist with latch;Breast massage;Breast compression  Audible Swallowing: Spontaneous and intermittent  Type of Nipple: Everted at rest and after stimulation  Comfort (Breast/Nipple): Filling, red/small blisters or bruises, mild/mod discomfort  Problem noted: Filling;Mild/Moderate  discomfort;Cracked, bleeding, blisters, bruises Interventions (Filling): Double electric pump  Hold (Positioning): Assistance needed to correctly position infant at breast and maintain latch. Intervention(s): Breastfeeding basics reviewed;Support Pillows;Position options;Skin to skin  LATCH Score: 8  Lactation Tools Discussed/Used Tools: Shells;Pump (LC instructed mom on shells , already has a hand pump ) Shell Type: Inverted Breast pump type: Manual   Consult Status Consult Status: Complete (per mom will see Rona Ravens tomorrow 9/10 ) Date: 09/29/13 Follow-up type: In-patient    Kathrin Greathouse 09/29/2013, 10:58 AM

## 2013-09-29 NOTE — Discharge Summary (Cosign Needed)
Obstetric Discharge Summary Reason for Admission: onset of labor and rupture of membranes Prenatal Procedures: ultrasound Intrapartum Procedures: cesarean: low cervical, transverse Postpartum Procedures: none Complications-Operative and Postpartum: none Hemoglobin  Date Value Ref Range Status  09/28/2013 8.4* 12.0 - 15.0 g/dL Final     REPEATED TO VERIFY     DELTA CHECK NOTED     HCT  Date Value Ref Range Status  09/28/2013 25.5* 36.0 - 46.0 % Final    Physical Exam:  General: alert, cooperative and no distress Lochia: appropriate Uterine Fundus: firm Incision: healing well DVT Evaluation: No evidence of DVT seen on physical exam.  Discharge Summary: Sandra Harding is a now 18 y.o. G1P1001 who was admitted at [redacted]w[redacted]d for spontaneous rupture of membranes without active labor. During the augmentation of her labor, the fetus experienced prolonged bradycardia which required emergency cesarean section. She delivered a viable female in cephalic presentation with nuchal cord x 1, and apgars 8, 9. The placenta was noted to be small with thin cord and was sent to pathology. She experienced no post-partum or post-operative complications.   Discharge Diagnoses: Term Pregnancy-delivered  Discharge Information: Date: 09/29/2013 Activity: pelvic rest Diet: routine Medications: Ibuprofen and Percocet Condition: stable Instructions: refer to practice specific booklet Discharge to: home   Newborn Data: Live born female  Birth Weight: 5 lb 10.7 oz (2571 g) APGAR: 8, 9  Home with mother.  William Dalton 09/29/2013, 8:00 AM

## 2013-11-10 ENCOUNTER — Ambulatory Visit (INDEPENDENT_AMBULATORY_CARE_PROVIDER_SITE_OTHER): Payer: Medicaid Other | Admitting: Obstetrics & Gynecology

## 2013-11-10 ENCOUNTER — Encounter: Payer: Self-pay | Admitting: Obstetrics & Gynecology

## 2013-11-10 NOTE — Progress Notes (Signed)
Patient ID: Sandra Harding, female   DOB: 07/14/1995, 18 y.o.   MRN: [id number] Subjective: 6 weeks postpartum     Sandra Harding BatonBuckner is a 18 y.o. female who presents for a postpartum visit. She is 5 week postpartum following a primary cesarean section. I have fully reviewed the prenatal and intrapartum course. The delivery was at 39 gestational weeks.Anesthesia: epidural. Postpartum course has been good. Baby's course has been normal. Baby is feeding by bottle - Similac with Iron. Bleeding no bleeding. Bowel function is normal. Bladder function is normal. Patient is not sexually active. Contraception method is OCP (estrogen/progesterone). Postpartum depression screening: negative.  The following portions of the patient's history were reviewed and updated as appropriate: allergies, current medications, past family history, past medical history, past social history, past surgical history and problem list.  Review of Systems Pertinent items are noted in HPI.   Objective:    BP 117/61  Pulse 64  Temp(Src) 97.7 F (36.5 C) (Oral)  Ht 5\' 2"  (1.575 m)  Wt 164 lb 6.4 oz (74.571 kg)  BMI 30.06 kg/m2  Breastfeeding? Yes  General:  alert, cooperative and no distress           Abdomen: soft, non-tender; bowel sounds normal; no masses,  no organomegaly well healed incision   Vulva:  not evaluated  Vagina: not evaluated  Cervix:     Corpus: not examined  Adnexa:  not evaluated  Rectal Exam: Not performed.        Assessment:     normal  postpartum exam. Pap smear not done at today's visit.   Plan:    1. Contraception: OCP (estrogen/progesterone) 2. Will get rx from family MD  Sandra PhenixJames G Harding Cerutti, MD 11/10/2013  3. Follow up  as needed.

## 2013-11-10 NOTE — Progress Notes (Signed)
Patient here today for PP. Reports she has an appointment 10/27 with PCP to obtain OCPs (wants to see that doctor because they put her on it before she got pregnant).

## 2013-11-10 NOTE — Patient Instructions (Signed)

## 2013-11-22 ENCOUNTER — Encounter: Payer: Self-pay | Admitting: Obstetrics & Gynecology

## 2014-10-16 IMAGING — US US OB COMP +14 WK
2 series · 12 of 28 positions shown · non-contrast
Comparison: none

[Series 1: us ob comp +14 wk · 1 of 7 slices shown (1 of 2)]
[im 4/7]
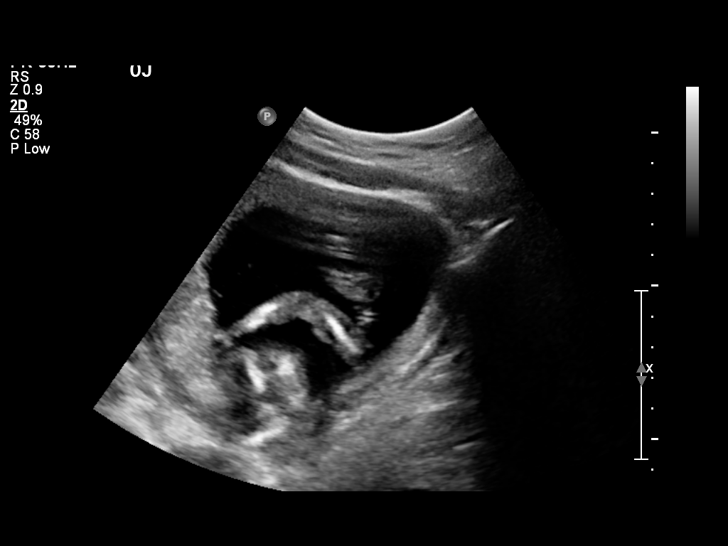

[Series 1: us ob comp +14 wk · 56 acquisitions, 11 frames shown (2 of 2)]
[im 1/56]
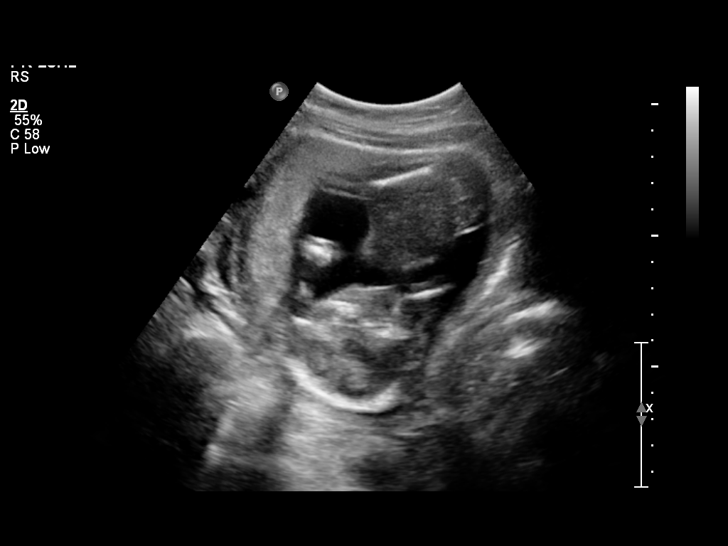
[im 5/56]
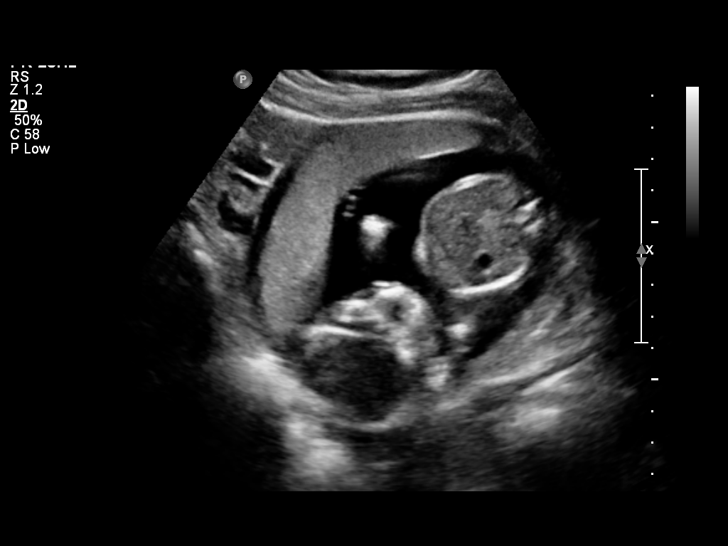
[im 12/56]
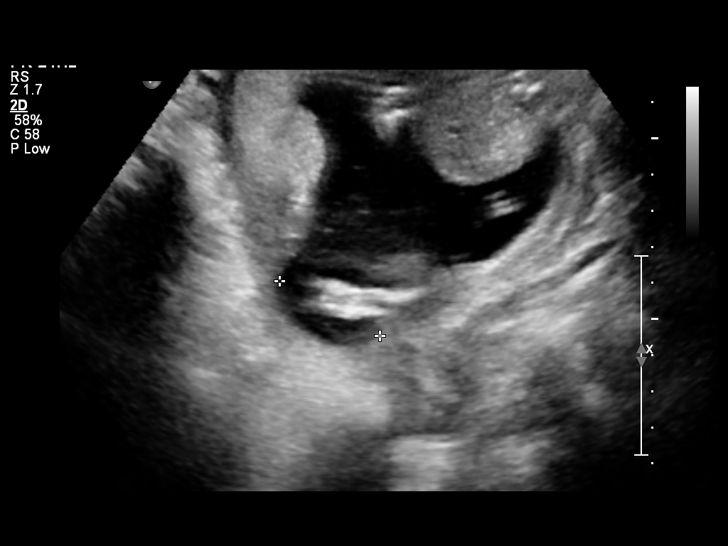
[im 17/56]
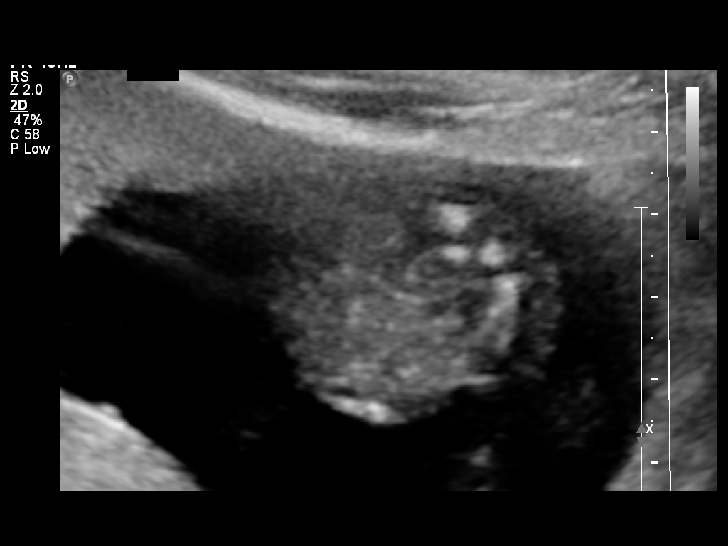
[im 21/56]
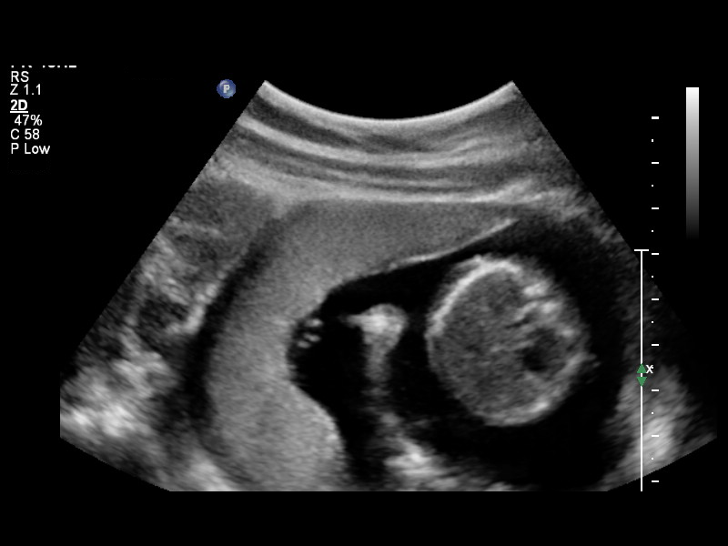
[im 28/56]
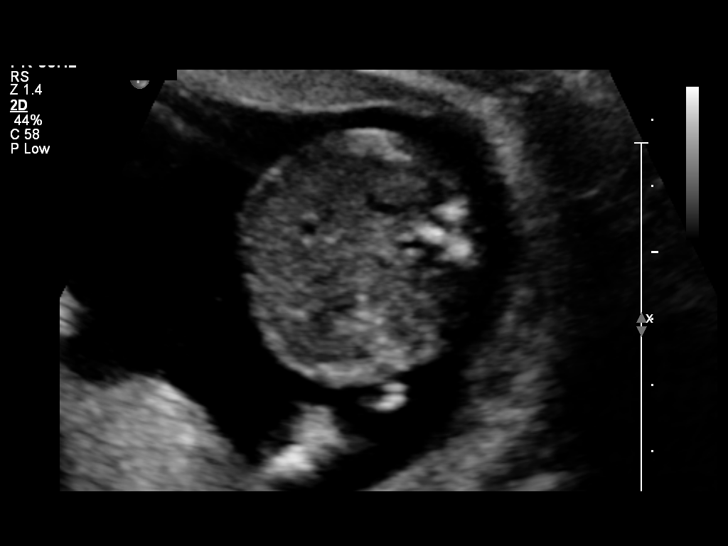
[im 33/56]
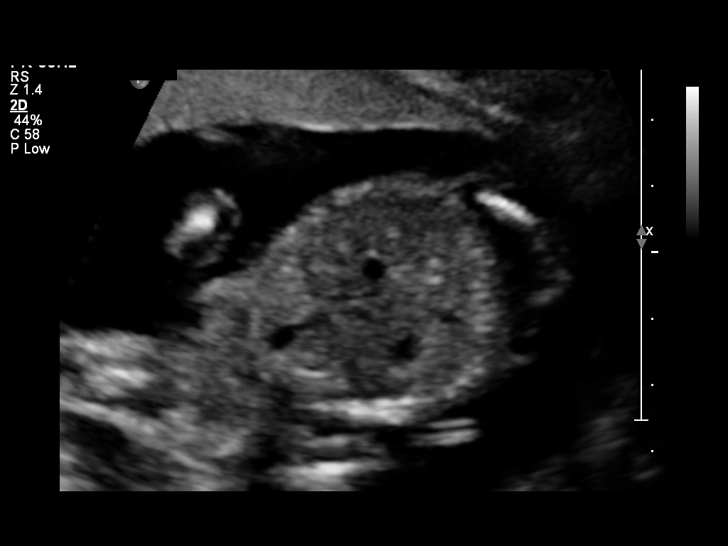
[im 37/56]
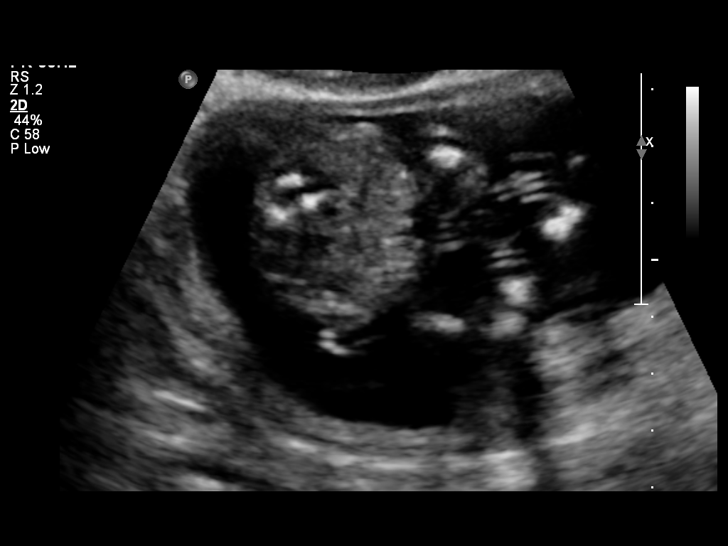
[im 44/56]
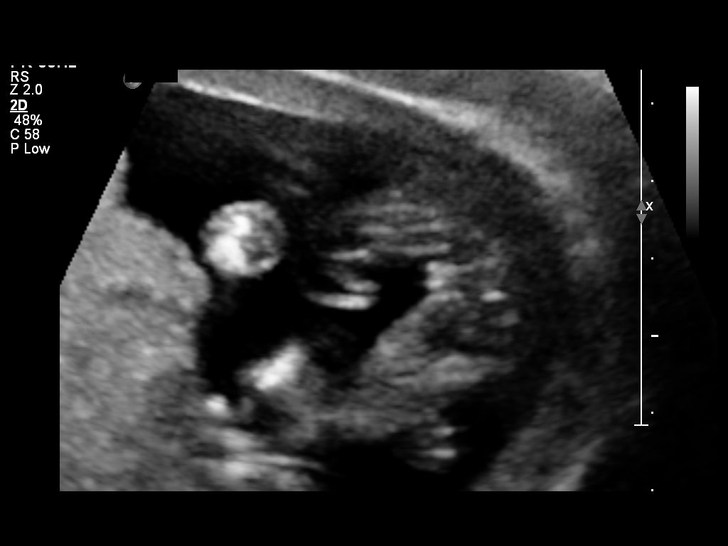
[im 49/56]
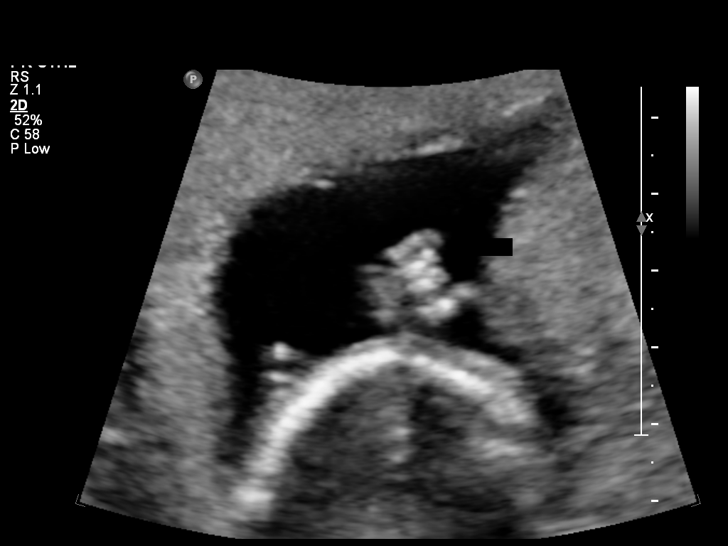
[im 53/56]
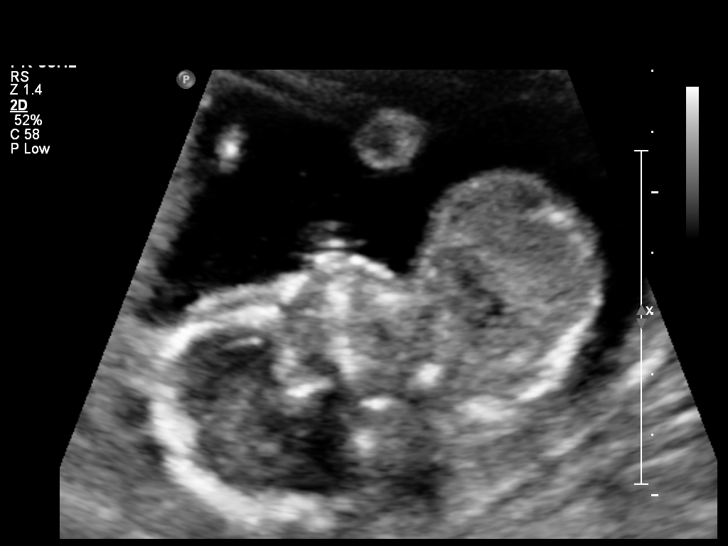

[12 of 28 positions shown; findings below may reference images not displayed]

OBSTETRICS REPORT
                      (Signed Final 04/26/2013 [DATE])

Service(s) Provided

 US OB COMP + 14 WK                                    76805.1
Indications

 Basic anatomic survey
Fetal Evaluation

 Num Of Fetuses:    1
 Fetal Heart Rate:  155                          bpm
 Cardiac Activity:  Observed
 Presentation:      Variable
 Placenta:          Fundal, above cervical os
 P. Cord            Visualized, central
 Insertion:

 Amniotic Fluid
 AFI FV:      Subjectively within normal limits
                                             Larg Pckt:     4.3  cm
Biometry

 BPD:     35.8  mm     G. Age:  17w 0d                CI:        74.95   70 - 86
                                                      FL/HC:      18.4   14.6 -

 HC:     131.2  mm     G. Age:  16w 5d       25  %    HC/AC:      1.12   1.07 -

 AC:     117.4  mm     G. Age:  17w 3d       65  %    FL/BPD:
 FL:      24.2  mm     G. Age:  17w 2d       56  %    FL/AC:      20.6   20 - 24

 Est. FW:     190  gm      0 lb 7 oz     63  %
Gestational Age

 U/S Today:     17w 1d                                        EDD:   10/03/13
 Best:          17w 0d     Det. By:  Early Ultrasound         EDD:   10/04/13
Anatomy

 Cranium:          Appears normal         Aortic Arch:      Not well visualized
 Fetal Cavum:      Not well visualized    Ductal Arch:      Not well visualized
 Ventricles:       Not well visualized    Diaphragm:        Appears normal
 Choroid Plexus:   Appears normal         Stomach:          Appears normal, left
                                                            sided
 Cerebellum:       Not well visualized    Abdomen:          Appears normal
 Posterior Fossa:  Not well visualized    Abdominal Wall:   Not well visualized
 Nuchal Fold:      Not well visualized    Cord Vessels:     Appears normal (3
                                                            vessel cord)
 Face:             Orbits appear          Kidneys:          Appear normal
                   normal
 Lips:             Not well visualized    Bladder:          Appears normal
 Heart:            Not well visualized    Spine:            Appears normal
 RVOT:             Not well visualized    Lower             Appears normal
                                          Extremities:
 LVOT:             Not well visualized    Upper             Appears normal
                                          Extremities:

 Other:  Fetus appears to be a female. Heels visualized. Nasal bone
         visualized. Technically difficult due to fetal position.
Cervix Uterus Adnexa

 Cervical Length:    3.58     cm

 Cervix:       Normal appearance by transabdominal scan.

 Left Ovary:    Not visualized.
 Right Ovary:   Not visualized.
 Adnexa:     No abnormality visualized.
Impression

 SIUP at 29w4d
 EFW 63rd%
 No dysmorphic features
 Limitations as documented above
 No previa
Recommendations

 Recommend follow up attempt to complete fetal survey in 8
 weeks.

## 2017-01-21 NOTE — L&D Delivery Note (Signed)
Patient: Sandra Harding MRN: [id number]  GBS status: negative  Patient is a 22 y.o. now G2P2 s/p NSVD at 4854w3d, who was admitted for IOL for gHTN. SROM 1h 2832m prior to delivery with clear fluid.    Delivery Note At 1:41 AM a viable female was delivered via Vaginal, Spontaneous (Presentation: cephalic; LOA).  APGAR: 9, 9; weight: pending (appears AGA).   Placenta status: intact, 3-vessel cord.  Cord:  with the following complications: none  Head delivered LOA. No nuchal cord present. Shoulder and body delivered in usual fashion. Infant with spontaneous cry, placed on mother's abdomen, dried and bulb suctioned. Cord clamped x 2 after 1-minute delay, and cut by family member. Cord blood drawn. Placenta delivered spontaneously with gentle cord traction. Fundus firm with massage and Pitocin. Perineum inspected and found to have 3A laceration, which was repaired by Dr. Earlene Plateravis with good hemostasis achieved.  Anesthesia:  epidural Episiotomy: None Lacerations: 3rd degree Suture Repair: 2.0 3.0 vicryl Est. Blood Loss (mL): 330  Mom to postpartum.  Baby to Couplet care / Skin to Skin.  Sandra Harding 01/19/2018, 3:23 AM

## 2017-06-27 ENCOUNTER — Encounter: Payer: Self-pay | Admitting: Certified Nurse Midwife

## 2017-06-27 ENCOUNTER — Other Ambulatory Visit (HOSPITAL_COMMUNITY)
Admission: RE | Admit: 2017-06-27 | Discharge: 2017-06-27 | Disposition: A | Discharge status: Home / Self Care | Payer: BLUE CROSS/BLUE SHIELD | Source: Ambulatory Visit | Attending: Certified Nurse Midwife | Admitting: Certified Nurse Midwife

## 2017-06-27 ENCOUNTER — Ambulatory Visit (INDEPENDENT_AMBULATORY_CARE_PROVIDER_SITE_OTHER): Payer: BLUE CROSS/BLUE SHIELD | Admitting: Certified Nurse Midwife

## 2017-06-27 VITALS — BP 115/73 | HR 65 | Wt 139.0 lb

## 2017-06-27 DIAGNOSIS — Z98891 History of uterine scar from previous surgery: Secondary | ICD-10-CM

## 2017-06-27 DIAGNOSIS — Z3481 Encounter for supervision of other normal pregnancy, first trimester: Secondary | ICD-10-CM

## 2017-06-27 DIAGNOSIS — Z3480 Encounter for supervision of other normal pregnancy, unspecified trimester: Secondary | ICD-10-CM | POA: Insufficient documentation

## 2017-06-27 DIAGNOSIS — Z8669 Personal history of other diseases of the nervous system and sense organs: Secondary | ICD-10-CM | POA: Insufficient documentation

## 2017-06-27 DIAGNOSIS — Z3687 Encounter for antenatal screening for uncertain dates: Secondary | ICD-10-CM | POA: Diagnosis not present

## 2017-06-27 HISTORY — DX: History of uterine scar from previous surgery: Z98.891

## 2017-06-27 NOTE — Progress Notes (Signed)
Pt c/o  occassional left sided abdominal pain to where she can't walk. PT states she has never had a pap smear.

## 2017-06-27 NOTE — Progress Notes (Signed)
Subjective:   Sandra Harding is a 22 y.o. G2P1001 at [redacted]w[redacted]d by LMP being seen today for her first obstetrical visit.  Her obstetrical history is significant for previous CS d/t fetal bradycardia. Patient does intend to breast feed. Pregnancy history fully reviewed.  Patient reports no complaints.  HISTORY: OB History  Gravida Para Term Preterm AB Living  2 1 1  0 0 1  SAB TAB Ectopic Multiple Live Births  0 0 0 0 1    # Outcome Date GA Lbr Len/2nd Weight Sex Delivery Anes PTL Lv  2 Current           1 Term 09/27/13 [redacted]w[redacted]d  5 lb 10.7 oz (2.571 kg) F CS-LTranv Gen  LIV     Name: Stenberg,GIRL Rechel     Apgar1: 8  Apgar5: 9   Past Medical History:  Diagnosis Date  . Medical history non-contributory   . Migraines    Past Surgical History:  Procedure Laterality Date  . CESAREAN SECTION N/A 09/27/2013   Procedure: CESAREAN SECTION;  Surgeon: Lesly Dukes, MD;  Location: [facility];  Service: Obstetrics;  Laterality: N/A;  . CESAREAN SECTION  2015  . NO PAST SURGERIES     Family History  Problem Relation Age of Onset  . Cancer Maternal Grandfather   . Diabetes Paternal Aunt   . Heart attack Maternal Grandmother   . Alzheimer's disease Paternal Grandfather    Social History   Tobacco Use  . Smoking status: Never Smoker  . Smokeless tobacco: Never Used  Substance Use Topics  . Alcohol use: No  . Drug use: Not Currently    Types: Marijuana   Allergies  Allergen Reactions  . Sulfa Antibiotics Hives and Rash   Current Outpatient Medications on File Prior to Visit  Medication Sig Dispense Refill  . Prenatal Vit-Fe Fumarate-FA (MULTIVITAMIN-PRENATAL) 27-0.8 MG TABS tablet Take 1 tablet by mouth daily at 12 noon.     No current facility-administered medications on file prior to visit.     Exam   Vitals:   06/27/17 1014  BP: 115/73  Pulse: 65  Weight: 139 lb (63 kg)   Fetal Heart Rate (bpm): 178  Uterus:     Pelvic Exam: Perineum: no hemorrhoids, normal  perineum   Vulva: normal external genitalia, no lesions   Vagina:  normal mucosa, normal discharge   Cervix: no lesions and normal, pap smear done.    Adnexa: normal adnexa and no mass, fullness, tenderness   Bony Pelvis: average  System: General: well-developed, well-nourished female in no acute distress   Breast:  normal appearance, no masses or tenderness   Skin: normal coloration and turgor, no rashes   Neurologic: oriented, normal, negative, normal mood   Extremities: normal strength, tone, and muscle mass, ROM of all joints is normal   HEENT    Mouth/Teeth mucous membranes moist, pharynx normal without lesions and dental hygiene good   Neck supple and no masses   Cardiovascular: regular rate and rhythm   Respiratory:  no respiratory distress, reg rate and effort   Abdomen: soft, non-tender; no masses,  no organomegaly     Assessment:   Pregnancy: G2P1001 Patient Active Problem List   Diagnosis Date Noted  . Supervision of other normal pregnancy, antepartum 06/27/2017  . Flu vaccine need 04/13/2013     Plan:  1. Supervision of other normal pregnancy, antepartum - Enroll Patient in Babyscripts - Cytology - PAP - Obstetric panel - HIV antibody (  with reflex) - US bedside; Future - Cystic fibrosis diagnostic study - Culture, OB Urine  2. Previous CS - planning TOLAC - discussed not a candidate for WB  Initial labs drawn. Continue prenatal vitamins. Genetic Screening discussed, NIPS: requested. Ultrasound discussed; fetal anatomic survey: requested. Problem list reviewed and updated. The nature of [place] [facility] with multiple MDs and other Advanced Practice Providers was explained to patient; also emphasized that residents, students are part of our team. Routine obstetric precautions reviewed. Return in about 5 weeks (around 08/01/2017).   Donette LarryMelanie Arzell Mcgeehan 11:52 AM 06/27/17

## 2017-06-27 NOTE — Patient Instructions (Signed)
First Trimester of Pregnancy The first trimester of pregnancy is from week 1 until the end of week 13 (months 1 through 3). During this time, your baby will begin to develop inside you. At 6-8 weeks, the eyes and face are formed, and the heartbeat can be seen on ultrasound. At the end of 12 weeks, all the baby's organs are formed. Prenatal care is all the medical care you receive before the birth of your baby. Make sure you get good prenatal care and follow all of your doctor's instructions. Follow these instructions at home: Medicines  Take over-the-counter and prescription medicines only as told by your doctor. Some medicines are safe and some medicines are not safe during pregnancy.  Take a prenatal vitamin that contains at least 600 micrograms (mcg) of folic acid.  If you have trouble pooping (constipation), take medicine that will make your stool soft (stool softener) if your doctor approves. Eating and drinking  Eat regular, healthy meals.  Your doctor will tell you the amount of weight gain that is right for you.  Avoid raw meat and uncooked cheese.  If you feel sick to your stomach (nauseous) or throw up (vomit): ? Eat 4 or 5 small meals a day instead of 3 large meals. ? Try eating a few soda crackers. ? Drink liquids between meals instead of during meals.  To prevent constipation: ? Eat foods that are high in fiber, like fresh fruits and vegetables, whole grains, and beans. ? Drink enough fluids to keep your pee (urine) clear or pale yellow. Activity  Exercise only as told by your doctor. Stop exercising if you have cramps or pain in your lower belly (abdomen) or low back.  Do not exercise if it is too hot, too humid, or if you are in a place of great height (high altitude).  Try to avoid standing for long periods of time. Move your legs often if you must stand in one place for a long time.  Avoid heavy lifting.  Wear low-heeled shoes. Sit and stand up straight.  You  can have sex unless your doctor tells you not to. Relieving pain and discomfort  Wear a good support bra if your breasts are sore.  Take warm water baths (sitz baths) to soothe pain or discomfort caused by hemorrhoids. Use hemorrhoid cream if your doctor says it is okay.  Rest with your legs raised if you have leg cramps or low back pain.  If you have puffy, bulging veins (varicose veins) in your legs: ? Wear support hose or compression stockings as told by your doctor. ? Raise (elevate) your feet for 15 minutes, 3-4 times a day. ? Limit salt in your food. Prenatal care  Schedule your prenatal visits by the twelfth week of pregnancy.  Write down your questions. Take them to your prenatal visits.  Keep all your prenatal visits as told by your doctor. This is important. Safety  Wear your seat belt at all times when driving.  Make a list of emergency phone numbers. The list should include numbers for family, friends, the hospital, and police and fire departments. General instructions  Ask your doctor for a referral to a local prenatal class. Begin classes no later than at the start of month 6 of your pregnancy.  Ask for help if you need counseling or if you need help with nutrition. Your doctor can give you advice or tell you where to go for help.  Do not use hot tubs, steam rooms, or   saunas.  Do not douche or use tampons or scented sanitary pads.  Do not cross your legs for long periods of time.  Avoid all herbs and alcohol. Avoid drugs that are not approved by your doctor.  Do not use any tobacco products, including cigarettes, chewing tobacco, and electronic cigarettes. If you need help quitting, ask your doctor. You may get counseling or other support to help you quit.  Avoid cat litter boxes and soil used by cats. These carry germs that can cause birth defects in the baby and can cause a loss of your baby (miscarriage) or stillbirth.  Visit your dentist. At home, brush  your teeth with a soft toothbrush. Be gentle when you floss. Contact a doctor if:  You are dizzy.  You have mild cramps or pressure in your lower belly.  You have a nagging pain in your belly area.  You continue to feel sick to your stomach, you throw up, or you have watery poop (diarrhea).  You have a bad smelling fluid coming from your vagina.  You have pain when you pee (urinate).  You have increased puffiness (swelling) in your face, hands, legs, or ankles. Get help right away if:  You have a fever.  You are leaking fluid from your vagina.  You have spotting or bleeding from your vagina.  You have very bad belly cramping or pain.  You gain or lose weight rapidly.  You throw up blood. It may look like coffee grounds.  You are around people who have German measles, fifth disease, or chickenpox.  You have a very bad headache.  You have shortness of breath.  You have any kind of trauma, such as from a fall or a car accident. Summary  The first trimester of pregnancy is from week 1 until the end of week 13 (months 1 through 3).  To take care of yourself and your unborn baby, you will need to eat healthy meals, take medicines only if your doctor tells you to do so, and do activities that are safe for you and your baby.  Keep all follow-up visits as told by your doctor. This is important as your doctor will have to ensure that your baby is healthy and growing well. This information is not intended to replace advice given to you by your health care provider. Make sure you discuss any questions you have with your health care provider. Document Released: 06/26/2007 Document Revised: 01/16/2016 Document Reviewed: 01/16/2016 Elsevier Interactive Patient Education  2017 [employer]  

## 2017-06-27 NOTE — Progress Notes (Signed)
Bedside U/S shows single IUP with FHT of 178 BPM and CRL is 41.453mm  GA measures @ 11 weeks

## 2017-06-27 NOTE — Addendum Note (Signed)
Addended by: Granville LewisLARK, Rachana Malesky L on: 06/27/2017 12:06 PM   Modules accepted: Orders

## 2017-06-30 LAB — CULTURE, OB URINE

## 2017-06-30 LAB — URINE CULTURE, OB REFLEX: Organism ID, Bacteria: NO GROWTH

## 2017-07-01 LAB — CYTOLOGY - PAP
Chlamydia: NEGATIVE
Diagnosis: NEGATIVE
Neisseria Gonorrhea: NEGATIVE

## 2017-07-04 LAB — CYSTIC FIBROSIS DIAGNOSTIC STUDY

## 2017-07-04 LAB — OBSTETRIC PANEL
Antibody Screen: NOT DETECTED
BASOS ABS: 21 {cells}/uL (ref 0–200)
BASOS PCT: 0.4 %
EOS PCT: 1.5 %
Eosinophils Absolute: 78 cells/uL (ref 15–500)
HCT: 33.8 % — ABNORMAL LOW (ref 35.0–45.0)
Hemoglobin: 11.9 g/dL (ref 11.7–15.5)
Hepatitis B Surface Ag: NONREACTIVE
Lymphs Abs: 1243 cells/uL (ref 850–3900)
MCH: 30.8 pg (ref 27.0–33.0)
MCHC: 35.2 g/dL (ref 32.0–36.0)
MCV: 87.6 fL (ref 80.0–100.0)
MONOS PCT: 7.7 %
MPV: 10.1 fL (ref 7.5–12.5)
Neutro Abs: 3458 cells/uL (ref 1500–7800)
Neutrophils Relative %: 66.5 %
Platelets: 278 10*3/uL (ref 140–400)
RBC: 3.86 10*6/uL (ref 3.80–5.10)
RDW: 11.9 % (ref 11.0–15.0)
RPR Ser Ql: NONREACTIVE
Rubella: 3 index
Total Lymphocyte: 23.9 %
WBC mixed population: 400 cells/uL (ref 200–950)
WBC: 5.2 10*3/uL (ref 3.8–10.8)

## 2017-07-04 LAB — HIV ANTIBODY (ROUTINE TESTING W REFLEX): HIV 1&2 Ab, 4th Generation: NONREACTIVE

## 2017-07-05 ENCOUNTER — Encounter: Payer: Self-pay | Admitting: Certified Nurse Midwife

## 2017-07-05 DIAGNOSIS — Z141 Cystic fibrosis carrier: Secondary | ICD-10-CM | POA: Insufficient documentation

## 2017-07-09 ENCOUNTER — Telehealth: Payer: Self-pay | Admitting: *Deleted

## 2017-07-09 DIAGNOSIS — Z141 Cystic fibrosis carrier: Secondary | ICD-10-CM

## 2017-07-09 NOTE — Telephone Encounter (Signed)
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 Please schedule genetic counseling.

## 2017-07-09 NOTE — Telephone Encounter (Signed)
Pt informed of being a CF carrier and appt scheduled with MFM genetic counselor for 07/15/17 @ 1P

## 2017-07-11 ENCOUNTER — Telehealth: Payer: Self-pay | Admitting: *Deleted

## 2017-07-11 NOTE — Telephone Encounter (Signed)
Pt notified that her Panorama did not have enough fetal DNA  To run the test.  She will return today for a redraw.

## 2017-07-15 ENCOUNTER — Ambulatory Visit (HOSPITAL_COMMUNITY)
Admission: RE | Admit: 2017-07-15 | Discharge: 2017-07-15 | Disposition: A | Discharge status: Home / Self Care | Payer: BLUE CROSS/BLUE SHIELD | Source: Ambulatory Visit | Attending: Obstetrics and Gynecology | Admitting: Obstetrics and Gynecology

## 2017-07-15 DIAGNOSIS — O352XX1 Maternal care for (suspected) hereditary disease in fetus, fetus 1: Secondary | ICD-10-CM

## 2017-07-15 DIAGNOSIS — Z3A13 13 weeks gestation of pregnancy: Secondary | ICD-10-CM | POA: Insufficient documentation

## 2017-07-15 DIAGNOSIS — Z141 Cystic fibrosis carrier: Secondary | ICD-10-CM

## 2017-07-15 NOTE — Progress Notes (Signed)
Genetic Counseling  High-Risk Gestation Note  Appointment Date:  07/15/2017 Referred By: Sandra Bellman, MD Date of Birth:  06-06-1995  Pregnancy History: G2P1001 Estimated Date of Delivery: 01/16/18 Estimated Gestational Age: 33w4dAttending: MGriffin Dakin MD   I met with 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     In summary:  Discussed cystic fibrosis and autosomal recessive inheritance  Discussed availability of carrier screening for the father of the baby  Reviewed limitations of screening  FOB is not involved and unavailable for testing  Reviewed risks to the fetus prior to carrier screening for the father of the baby (1 in 145  Discussed option of prenatal diagnosis only when mutations have been identified in both parents  Declined amniocentesis at this time  Understand limitation of ultrasound in diagnosis of cf  Discussed newborn screening for CF and that carriers can have an screen positive result  Reviewed additional family history concerns  Patient's daughter with optic nerve hypoplasia  Appears to be isolated and nonsyndromic; no formal genetics eval   We reviewed the results of [name] CF carrier screening.  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 CF carrier screening has not yet been performed for her partner, the FOB.  [name] no additional relatives known to be CF carriers and no known relatives with cystic fibrosis. The FOB is not currently involved with the pregnancy. He is not known to have a family history of CF, but limited information is known about the FOB and his family. Consanguinity was denied. He has European Caucasian ancestry.   [name] provided with written information regarding cystic fibrosis. Classic features of CF include thickened secretions in the lungs,  digestive and reproductive systems. This life-limiting condition is characterized by chronic respiratory infections requiring daily chest therapies and pancreatic dysfunction disrupting the body's ability to break down food and extract nutrients as it should, which may restrict growth. Infertility commonly occurs in males. With therapies, such as daily respiratory therapies and medications to aid digestion, the median lifespan for people with CF is now mid-40's. We discussed that more recent therapies include CFTR modulator therapies, which are medications designed to correct the function of the defective protein made by CFTR. We discussed that the particular mutation identified for [name] considered a severe mutation. Treatment may involve lung transplantation in some cases. There can be significant variability in the severity of symptoms and expression of the disease. Expression of the disease depends upon the specific mutations present in an individual with CF.   We spent time reviewing the autosomal recessive inheritance of CF. CF is a common genetic condition in the Caucasian population occurring in approximately 1 in 348,300Caucasian births. This means approximately 1 in 29 Caucasians is a CF carrier. We discussed that individuals who are carriers have one copy of the CFTR gene with a disease causing mutation, and their other CFTR gene copy functions correctly. Thus, carriers typically do not have associated medical symptoms. We discussed that when both parents are carriers for CF, each pregnancy has an independent chance for one of the following outcomes: a 25% chance to inherit both mutations and thus have CF; a 50% chance to inherit one gene mutation and be a carrier similar to parents; and a 25% chance to be neither a carrier nor have CF. When one parent is  a CF carrier but the other is not, then each pregnancy has a 1 in 2 chance to be a CF carrier but would not be expected to be at increased  risk to inherit CF.   There are known to be thousands of mutations which can cause the CFTR gene to not function properly. Carrier screening is available to assess for the most common disease causing mutations. However, carrier screening does not identify all CF carriers. Thus, a negative CF carrier screen would reduce, but not eliminate, the chance to be a CF carrier and thus the chance for CF in a pregnancy.  Carrier screening for the most common mutations detects approximately 90% of carriers in the Caucasian population. Sequencing of the CFTR gene identifies up to 99% of mutations.  We reviewed that when both parents are identified to be CF carriers, prenatal diagnosis via amniocentesis would be available, if desired. The risks, benefits, and limitations of amniocentesis were reviewed. A fetus with cystic fibrosis typically appears normal on targeted ultrasound, although rarely echogenic bowel is visualized. However, the presence of echogenic bowel on targeted ultrasound is not diagnostic for CF in a pregnancy, nor does the absence of echogenic bowel on ultrasound rule out CF in the pregnancy. We discussed that postnatal testing for CF can also be performed for babies identified to be at risk to inherit CF. She understands that in [place], the newborn screening test will detect CF, but that carriers may come back as false positives.  Given that FOB has no known family history of CF, he is expected to have the general population chance to be a carrier, or approximately 1 in 29, prior to carrier screening. Thus, prior to carrier screening for the FOB, the chance for an affected pregnancy is approximately 1 in 116 (0.9%).  We discussed that CF carrier screening for the FOB would further refine the risk for CF in the current pregnancy. Unfortunately, [name] reported that the FOB is not currently involved with the pregnancy is likely would not be willing to have carrier testing. She will consider  whether or not she wishes to discuss this with him. The patient stated that even in the event that he was identified as a carrier of CF, she would not wish to pursue amniocentesis for prenatal diagnosis of CF.  The remainder of both family histories were reviewed and were found to be contributory for the patient's daughter, with a different partner, having bilateral optic nerve hypoplasia (Sandra Harding). This child is reportedly 20 years of age and is doing well. She has normal intellect and no other organ system concerns. She appears nondysmorphic. [name] was counseled that [facility] can occur as an isolated nonsyndromic difference, which typically occurs early during fetal development, or can have an underlying genetic etiology. We reviewed genes, inheritance patterns, de novo mutations versus inherited mutations, and the wide range of conditions associated with ONH. [name] reported that her daughter's ophthalmologist has never indicated that he suspects an underlying syndrome or genetic condition. We discussed that the risk of recurrence for isolated cases is usually low. There is no other family history of [place] or other eye conditions. Without further information regarding the provided family history, an accurate genetic risk cannot be calculated. Further genetic counseling is warranted if more information is obtained.  [name] also reported that her paternal half-sister has a son who may have a chromosome condition. She reported that this relative lives in [place] and that she is not in contact  with her sister. She has essentially no information about this relative or his features. We discussed sporadic versus inherited chromosome differences and the availability of chromosome analysis for [name] to determine if she carries a balanced chromosome rearrangement. She declined this testing. She was encouraged to contact us if she learns additional information about this relative and the cause of his  medical/developmental concerns. Without  further information regarding the provided family history, an accurate genetic risk cannot be calculated. Further genetic counseling is warranted if more information is obtained.  [name] denied exposure to environmental toxins or chemical agents. She denied the use of alcohol, tobacco or street drugs. She denied significant viral illnesses during the course of her pregnancy.   I counseled this patient regarding the above risks and available options.  The approximate face-to-face time with the genetic counselor was 43 minutes.  Sandra Schilder, MS  Certified Genetic Counselor

## 2017-07-21 ENCOUNTER — Telehealth: Payer: Self-pay

## 2017-07-21 DIAGNOSIS — Z3480 Encounter for supervision of other normal pregnancy, unspecified trimester: Secondary | ICD-10-CM

## 2017-07-21 NOTE — Telephone Encounter (Signed)
Spoke with pt and she is aware [facility] results are available. Pt is aware it came back low risk. PT does not want to know gender so she will pick up results in an envelope.

## 2017-08-01 ENCOUNTER — Ambulatory Visit (INDEPENDENT_AMBULATORY_CARE_PROVIDER_SITE_OTHER): Payer: BLUE CROSS/BLUE SHIELD | Admitting: Certified Nurse Midwife

## 2017-08-01 VITALS — BP 109/62 | HR 72 | Wt 140.0 lb

## 2017-08-01 DIAGNOSIS — Z3482 Encounter for supervision of other normal pregnancy, second trimester: Secondary | ICD-10-CM

## 2017-08-01 DIAGNOSIS — Z3402 Encounter for supervision of normal first pregnancy, second trimester: Secondary | ICD-10-CM

## 2017-08-01 DIAGNOSIS — Z98891 History of uterine scar from previous surgery: Secondary | ICD-10-CM

## 2017-08-01 DIAGNOSIS — Z3480 Encounter for supervision of other normal pregnancy, unspecified trimester: Secondary | ICD-10-CM

## 2017-08-01 DIAGNOSIS — Z141 Cystic fibrosis carrier: Secondary | ICD-10-CM

## 2017-08-01 NOTE — Progress Notes (Signed)
Subjective:  Sandra Harding BatonBuckner is a 22 y.o. G2P1001 at 7282w0d being seen today for ongoing prenatal care.  She is currently monitored for the following issues for this low-risk pregnancy and has Flu vaccine need; Supervision of other normal pregnancy, antepartum; Previous cesarean section; History of migraine; Cystic fibrosis gene carrier; [redacted] weeks gestation of pregnancy; and Hereditary disease in family possibly affecting fetus, fetus 1 on their problem list.  Patient reports no complaints.  Contractions: Not present. Vag. Bleeding: None.  Movement: Absent. Denies leaking of fluid.   The following portions of the patient's history were reviewed and updated as appropriate: allergies, current medications, past family history, past medical history, past social history, past surgical history and problem list. Problem list updated.  Objective:   Vitals:   08/01/17 0836  BP: 109/62  Pulse: 72  Weight: 140 lb (63.5 kg)    Fetal Status: Fetal Heart Rate (bpm): 164   Movement: Absent     General:  Alert, oriented and cooperative. Patient is in no acute distress.  Skin: Skin is warm and dry. No rash noted.   Cardiovascular: Normal heart rate noted  Respiratory: Normal respiratory effort, no problems with respiration noted  Abdomen: Soft, gravid, appropriate for gestational age. Pain/Pressure: Absent     Pelvic: Vag. Bleeding: None Vag D/C Character: Thin   Cervical exam deferred        Extremities: Normal range of motion.  Edema: None  Mental Status: Normal mood and affect. Normal behavior. Normal judgment and thought content.   Urinalysis: Urine Protein: Negative Urine Glucose: Negative  Assessment and Plan:  Pregnancy: G2P1001 at 8282w0d  1. Encounter for supervision of normal first pregnancy in second trimester - Alpha fetoprotein, maternal  2. Supervision of other normal pregnancy, antepartum - US MFM OB COMP + 14 WK; Future  3. Cystic fibrosis gene carrier - genetic counseling  complete  Preterm labor symptoms and general obstetric precautions including but not limited to vaginal bleeding, contractions, leaking of fluid and fetal movement were reviewed in detail with the patient. Please refer to After Visit Summary for other counseling recommendations.  Return in about 1 month (around 08/29/2017).   Donette LarryBhambri, Janette Harvie, CNM

## 2017-08-01 NOTE — Progress Notes (Signed)
PT states she has been feeling lightheaded and dizzy to where she has to sit down

## 2017-08-04 LAB — ALPHA FETOPROTEIN, MATERNAL
AFP MOM: 1.17
AFP, Serum: 40 ng/mL
CALC'D GESTATIONAL AGE: 16 wk
Maternal Wt: 140 [lb_av]
Risk for ONTD: <1
TWINS-AFP: 1

## 2017-08-18 ENCOUNTER — Other Ambulatory Visit: Payer: Self-pay | Admitting: Certified Nurse Midwife

## 2017-08-18 ENCOUNTER — Other Ambulatory Visit (HOSPITAL_COMMUNITY): Payer: Self-pay | Admitting: *Deleted

## 2017-08-18 ENCOUNTER — Ambulatory Visit (HOSPITAL_COMMUNITY)
Admission: RE | Admit: 2017-08-18 | Discharge: 2017-08-18 | Disposition: A | Discharge status: Home / Self Care | Payer: BLUE CROSS/BLUE SHIELD | Source: Ambulatory Visit | Attending: Certified Nurse Midwife | Admitting: Certified Nurse Midwife

## 2017-08-18 DIAGNOSIS — Z3A18 18 weeks gestation of pregnancy: Secondary | ICD-10-CM

## 2017-08-18 DIAGNOSIS — Z3480 Encounter for supervision of other normal pregnancy, unspecified trimester: Secondary | ICD-10-CM

## 2017-08-18 DIAGNOSIS — O34219 Maternal care for unspecified type scar from previous cesarean delivery: Secondary | ICD-10-CM

## 2017-08-18 DIAGNOSIS — Z141 Cystic fibrosis carrier: Secondary | ICD-10-CM

## 2017-08-18 DIAGNOSIS — O09892 Supervision of other high risk pregnancies, second trimester: Secondary | ICD-10-CM | POA: Diagnosis not present

## 2017-08-18 DIAGNOSIS — Z363 Encounter for antenatal screening for malformations: Secondary | ICD-10-CM

## 2017-08-18 DIAGNOSIS — Z362 Encounter for other antenatal screening follow-up: Secondary | ICD-10-CM

## 2017-08-29 ENCOUNTER — Encounter: Payer: Self-pay | Admitting: Advanced Practice Midwife

## 2017-08-29 ENCOUNTER — Inpatient Hospital Stay (HOSPITAL_BASED_OUTPATIENT_CLINIC_OR_DEPARTMENT_OTHER): Payer: BLUE CROSS/BLUE SHIELD

## 2017-08-29 ENCOUNTER — Encounter: Payer: Self-pay | Admitting: Certified Nurse Midwife

## 2017-08-29 ENCOUNTER — Encounter (HOSPITAL_COMMUNITY): Payer: Self-pay | Admitting: *Deleted

## 2017-08-29 ENCOUNTER — Inpatient Hospital Stay (HOSPITAL_COMMUNITY)
Admission: AD | Admit: 2017-08-29 | Discharge: 2017-08-29 | Disposition: A | Discharge status: Home / Self Care | Payer: BLUE CROSS/BLUE SHIELD | Source: Ambulatory Visit | Attending: Obstetrics & Gynecology | Admitting: Obstetrics & Gynecology

## 2017-08-29 ENCOUNTER — Other Ambulatory Visit: Payer: Self-pay

## 2017-08-29 DIAGNOSIS — N760 Acute vaginitis: Secondary | ICD-10-CM | POA: Diagnosis not present

## 2017-08-29 DIAGNOSIS — O26899 Other specified pregnancy related conditions, unspecified trimester: Secondary | ICD-10-CM

## 2017-08-29 DIAGNOSIS — O26892 Other specified pregnancy related conditions, second trimester: Secondary | ICD-10-CM | POA: Insufficient documentation

## 2017-08-29 DIAGNOSIS — O09892 Supervision of other high risk pregnancies, second trimester: Secondary | ICD-10-CM | POA: Diagnosis not present

## 2017-08-29 DIAGNOSIS — Z3A20 20 weeks gestation of pregnancy: Secondary | ICD-10-CM

## 2017-08-29 DIAGNOSIS — B9689 Other specified bacterial agents as the cause of diseases classified elsewhere: Secondary | ICD-10-CM

## 2017-08-29 DIAGNOSIS — Z3480 Encounter for supervision of other normal pregnancy, unspecified trimester: Secondary | ICD-10-CM

## 2017-08-29 DIAGNOSIS — N949 Unspecified condition associated with female genital organs and menstrual cycle: Secondary | ICD-10-CM

## 2017-08-29 DIAGNOSIS — O34219 Maternal care for unspecified type scar from previous cesarean delivery: Secondary | ICD-10-CM

## 2017-08-29 DIAGNOSIS — R109 Unspecified abdominal pain: Secondary | ICD-10-CM

## 2017-08-29 DIAGNOSIS — Z98891 History of uterine scar from previous surgery: Secondary | ICD-10-CM

## 2017-08-29 DIAGNOSIS — Z141 Cystic fibrosis carrier: Secondary | ICD-10-CM

## 2017-08-29 LAB — WET PREP, GENITAL
Sperm: NONE SEEN
Trich, Wet Prep: NONE SEEN
Yeast Wet Prep HPF POC: NONE SEEN

## 2017-08-29 LAB — URINALYSIS, ROUTINE W REFLEX MICROSCOPIC
BACTERIA UA: NONE SEEN
BILIRUBIN URINE: NEGATIVE
GLUCOSE, UA: NEGATIVE mg/dL
HGB URINE DIPSTICK: NEGATIVE
KETONES UR: 5 mg/dL — AB
NITRITE: NEGATIVE
PH: 5 (ref 5.0–8.0)
PROTEIN: NEGATIVE mg/dL
Specific Gravity, Urine: 1.031 — ABNORMAL HIGH (ref 1.005–1.030)

## 2017-08-29 LAB — CBC
HEMATOCRIT: 34.2 % — AB (ref 36.0–46.0)
HEMOGLOBIN: 11.9 g/dL — AB (ref 12.0–15.0)
MCH: 31.3 pg (ref 26.0–34.0)
MCHC: 34.8 g/dL (ref 30.0–36.0)
MCV: 90 fL (ref 78.0–100.0)
Platelets: 230 10*3/uL (ref 150–400)
RBC: 3.8 MIL/uL — AB (ref 3.87–5.11)
RDW: 12.2 % (ref 11.5–15.5)
WBC: 8.8 10*3/uL (ref 4.0–10.5)

## 2017-08-29 MED ORDER — METRONIDAZOLE 500 MG PO TABS: 500.0000 mg | ORAL_TABLET | Freq: Two times a day (BID) | ORAL | 0 refills | Status: DC

## 2017-08-29 NOTE — Discharge Instructions (Signed)
° °Bacterial Vaginosis °Bacterial vaginosis is an infection of the vagina. It happens when too many germs (bacteria) grow in the vagina. This infection puts you at risk for infections from sex (STIs). Treating this infection can lower your risk for some STIs. You should also treat this if you are pregnant. It can cause your baby to be born early. °Follow these instructions at home: °Medicines °· Take over-the-counter and prescription medicines only as told by your doctor. °· Take or use your antibiotic medicine as told by your doctor. Do not stop taking or using it even if you start to feel better. °General instructions °· If you your sexual partner is a woman, tell her that you have this infection. She needs to get treatment if she has symptoms. If you have a female partner, he does not need to be treated. °· During treatment: °? Avoid sex. °? Do not douche. °? Avoid alcohol as told. °? Avoid breastfeeding as told. °· Drink enough fluid to keep your pee (urine) clear or pale yellow. °· Keep your vagina and butt (rectum) clean. °? Wash the area with warm water every day. °? Wipe from front to back after you use the toilet. °· Keep all follow-up visits as told by your doctor. This is important. °Preventing this condition °· Do not douche. °· Use only warm water to wash around your vagina. °· Use protection when you have sex. This includes: °? Latex condoms. °? Dental dams. °· Limit how many people you have sex with. It is best to only have sex with the same person (be monogamous). °· Get tested for STIs. Have your partner get tested. °· Wear underwear that is cotton or lined with cotton. °· Avoid tight pants and pantyhose. This is most important in [place]. °· Do not use any products that have nicotine or tobacco in them. These include cigarettes and e-cigarettes. If you need help quitting, ask your doctor. °· Do not use illegal drugs. °· Limit how much alcohol you drink. °Contact a doctor if: °· Your symptoms do not  get better, even after you are treated. °· You have more discharge or pain when you pee (urinate). °· You have a fever. °· You have pain in your belly (abdomen). °· You have pain with sex. °· Your bleed from your vagina between periods. °Summary °· This infection happens when too many germs (bacteria) grow in the vagina. °· Treating this condition can lower your risk for some infections from sex (STIs). °· You should also treat this if you are pregnant. It can cause early (premature) birth. °· Do not stop taking or using your antibiotic medicine even if you start to feel better. °This information is not intended to replace advice given to you by your health care provider. Make sure you discuss any questions you have with your health care provider. °Document Released: 10/17/2007 Document Revised: 09/23/2015 Document Reviewed: 09/23/2015 °Elsevier Interactive Patient Education © 2017 Elsevier Inc. °Round Ligament Pain °The round ligament is a cord of muscle and tissue that helps to support the uterus. It can become a source of pain during pregnancy if it becomes stretched or twisted as the baby grows. The pain usually begins in the second trimester of pregnancy, and it can come and go until the baby is delivered. It is not a serious problem, and it does not cause harm to the baby. °Round ligament pain is usually a short, sharp, and pinching pain, but it can also be a dull, lingering, and   pain. The pain is felt in the lower side of the abdomen or in the groin. It usually starts deep in the groin and moves up to the outside of the hip area. Pain can occur with:  A sudden change in position.  Rolling over in bed.  Coughing or sneezing.  Physical activity.  Follow these instructions at home: Watch your condition for any changes. Take these steps to help with your pain:  When the pain starts, relax. Then try: ? Sitting down. ? Flexing your knees up to your abdomen. ? Lying on your side with one pillow  under your abdomen and another pillow between your legs. ? Sitting in a warm bath for 15-20 minutes or until the pain goes away.  Take over-the-counter and prescription medicines only as told by your health care provider.  Move slowly when you sit and stand.  Avoid long walks if they cause pain.  Stop or lessen your physical activities if they cause pain.  Contact a health care provider if:  Your pain does not go away with treatment.  You feel pain in your back that you did not have before.  Your medicine is not helping. Get help right away if:  You develop a fever or chills.  You develop uterine contractions.  You develop vaginal bleeding.  You develop nausea or vomiting.  You develop diarrhea.  You have pain when you urinate. This information is not intended to replace advice given to you by your health care provider. Make sure you discuss any questions you have with your health care provider. Document Released: 10/17/2007 Document Revised: 06/15/2015 Document Reviewed: 03/16/2014 Elsevier Interactive Patient Education  [employer]2018 Elsevier Inc.

## 2017-08-29 NOTE — MAU Note (Signed)
Pt presents with c/o sharp intermittent lower abdominal pain that began @ 0530 this morning.  Reports pain was initially constant for 45 minutes but since become intermittent.  Denies VB.  States hasn't felt FM since last night. Hasn't taken any meds for pain.

## 2017-08-29 NOTE — MAU Provider Note (Addendum)
History     CSN: [id number]  Arrival date and time: 08/29/17 0734   First Provider Initiated Contact with Patient 08/29/17 0823     G2P1001 @20 .0 wks here with LAP. Pain started suddenly this am and woke her from sleep. Located in lower abdomen, bilateral, worse on left. Initially felt like a long ctx then became intermittent and sharp. Lying down made it worse. Rates 8/10 when occurs. She did not take anything for it. She had one episode of diarrhea which did not improve the pain. Denies urinary sx. No fevers. No VB. No recent IC. Having some increased vaginal discharge with odor.    OB History    Gravida  2   Para  1   Term  1   Preterm      AB      Living  1     SAB      TAB      Ectopic      Multiple      Live Births  1           Past Medical History:  Diagnosis Date  . Medical history non-contributory   . Migraines     Past Surgical History:  Procedure Laterality Date  . CESAREAN SECTION N/A 09/27/2013   Procedure: CESAREAN SECTION;  Surgeon: Lesly Dukes, MD;  Location: [facility];  Service: Obstetrics;  Laterality: N/A;  . CESAREAN SECTION  2015  . NO PAST SURGERIES      Family History  Problem Relation Age of Onset  . Cancer Maternal Grandfather   . Diabetes Paternal Aunt   . Heart attack Maternal Grandmother   . Alzheimer's disease Paternal Grandfather   . Cancer Maternal Uncle     Social History   Tobacco Use  . Smoking status: Former Games developer  . Smokeless tobacco: Never Used  Substance Use Topics  . Alcohol use: No  . Drug use: Not Currently    Types: Marijuana    Allergies:  Allergies  Allergen Reactions  . Sulfa Antibiotics Hives and Rash    Medications Prior to Admission  Medication Sig Dispense Refill Last Dose  . Prenatal Vit-Fe Fumarate-FA (MULTIVITAMIN-PRENATAL) 27-0.8 MG TABS tablet Take 1 tablet by mouth daily at 12 noon.   Taking    Review of Systems  Constitutional: Negative for chills and fever.   Gastrointestinal: Positive for abdominal pain and diarrhea. Negative for nausea and vomiting.  Genitourinary: Positive for vaginal discharge. Negative for dysuria, hematuria, urgency and vaginal bleeding.   Physical Exam   Blood pressure 121/74, pulse 78, temperature 97.8 F (36.6 C), temperature source Oral, resp. rate 18, height 5\' 2"  (1.575 m), weight 65.9 kg, last menstrual period 04/11/2017, SpO2 100 %, currently breastfeeding.  Physical Exam  Nursing note and vitals reviewed. Constitutional: She is oriented to person, place, and time. She appears well-developed and well-nourished. No distress.  HENT:  Head: Normocephalic and atraumatic.  Neck: Normal range of motion.  Cardiovascular: Normal rate.  Respiratory: Effort normal. No respiratory distress.  GI: Soft. She exhibits no distension and no mass. There is tenderness (along low transverse scar, worse on left). There is no rebound and no guarding.  Genitourinary:  Genitourinary Comments: Cervix closed/long, no blood  Musculoskeletal: Normal range of motion.  Neurological: She is alert and oriented to person, place, and time.  Skin: Skin is warm and dry.  Psychiatric: She has a normal mood and affect.  FHT 166  Results for orders placed or performed during  the hospital encounter of 08/29/17 (from the past 24 hour(s))  Urinalysis, Routine w reflex microscopic     Status: Abnormal   Collection Time: 08/29/17  8:01 AM  Result Value Ref Range   Color, Urine AMBER (A) YELLOW   APPearance HAZY (A) CLEAR   Specific Gravity, Urine 1.031 (H) 1.005 - 1.030   pH 5.0 5.0 - 8.0   Glucose, UA NEGATIVE NEGATIVE mg/dL   Hgb urine dipstick NEGATIVE NEGATIVE   Bilirubin Urine NEGATIVE NEGATIVE   Ketones, ur 5 (A) NEGATIVE mg/dL   Protein, ur NEGATIVE NEGATIVE mg/dL   Nitrite NEGATIVE NEGATIVE   Leukocytes, UA TRACE (A) NEGATIVE   RBC / HPF 0-5 0 - 5 RBC/hpf   WBC, UA 0-5 0 - 5 WBC/hpf   Bacteria, UA NONE SEEN NONE SEEN   Squamous  Epithelial / LPF 6-10 0 - 5   Mucus PRESENT    Ca Oxalate Crys, UA PRESENT   Wet prep, genital     Status: Abnormal   Collection Time: 08/29/17  8:35 AM  Result Value Ref Range   Yeast Wet Prep HPF POC NONE SEEN NONE SEEN   Trich, Wet Prep NONE SEEN NONE SEEN   Clue Cells Wet Prep HPF POC PRESENT (A) NONE SEEN   WBC, Wet Prep HPF POC FEW (A) NONE SEEN   Sperm NONE SEEN   CBC     Status: Abnormal   Collection Time: 08/29/17  8:46 AM  Result Value Ref Range   WBC 8.8 4.0 - 10.5 K/uL   RBC 3.80 (L) 3.87 - 5.11 MIL/uL   Hemoglobin 11.9 (L) 12.0 - 15.0 g/dL   HCT 36.634.2 (L) 44.036.0 - 34.746.0 %   MCV 90.0 78.0 - 100.0 fL   MCH 31.3 26.0 - 34.0 pg   MCHC 34.8 30.0 - 36.0 g/dL   RDW 42.512.2 95.611.5 - 38.715.5 %   Platelets 230 150 - 400 K/uL   MAU Course  Procedures  MDM Declines analgesic. Labs and US ordered and reviewed. US normal. No evidence of abruption or PTL. No further episodes of pain. Pain likely RL, recommend comfort measures. Will treat BV. Stable for discharge home.   Assessment and Plan  [redacted] weeks gestation Round ligament pain Bacterial vaginosis  Discharge home Follow up in OB office as scheduled Rx Flagyl PTL precautions  Allergies as of 08/29/2017      Reactions   Sulfa Antibiotics Hives, Rash      Medication List    TAKE these medications   metroNIDAZOLE 500 MG tablet Commonly known as:  FLAGYL Take 1 tablet (500 mg total) by mouth 2 (two) times daily.   multivitamin-prenatal 27-0.8 MG Tabs tablet Take 1 tablet by mouth daily at 12 noon.      Donette LarryMelanie Simren Popson, CNM 08/29/2017, 8:37 AM

## 2017-09-01 LAB — GC/CHLAMYDIA PROBE AMP (~~LOC~~) NOT AT ARMC
CHLAMYDIA, DNA PROBE: NEGATIVE
Neisseria Gonorrhea: NEGATIVE

## 2017-09-12 ENCOUNTER — Encounter: Payer: Self-pay | Admitting: Certified Nurse Midwife

## 2017-09-15 ENCOUNTER — Ambulatory Visit (HOSPITAL_COMMUNITY)
Admission: RE | Admit: 2017-09-15 | Discharge: 2017-09-15 | Disposition: A | Discharge status: Home / Self Care | Payer: BLUE CROSS/BLUE SHIELD | Source: Ambulatory Visit | Attending: Obstetrics & Gynecology | Admitting: Obstetrics & Gynecology

## 2017-09-15 DIAGNOSIS — Z3A22 22 weeks gestation of pregnancy: Secondary | ICD-10-CM | POA: Diagnosis not present

## 2017-09-15 DIAGNOSIS — Z362 Encounter for other antenatal screening follow-up: Secondary | ICD-10-CM | POA: Insufficient documentation

## 2017-09-15 DIAGNOSIS — O34219 Maternal care for unspecified type scar from previous cesarean delivery: Secondary | ICD-10-CM

## 2017-09-19 ENCOUNTER — Ambulatory Visit (INDEPENDENT_AMBULATORY_CARE_PROVIDER_SITE_OTHER): Payer: BLUE CROSS/BLUE SHIELD | Admitting: Advanced Practice Midwife

## 2017-09-19 VITALS — BP 108/66 | HR 74 | Wt 152.0 lb

## 2017-09-19 DIAGNOSIS — Z3480 Encounter for supervision of other normal pregnancy, unspecified trimester: Secondary | ICD-10-CM

## 2017-09-19 DIAGNOSIS — Z98891 History of uterine scar from previous surgery: Secondary | ICD-10-CM

## 2017-09-19 DIAGNOSIS — Z3482 Encounter for supervision of other normal pregnancy, second trimester: Secondary | ICD-10-CM

## 2017-09-19 DIAGNOSIS — Z141 Cystic fibrosis carrier: Secondary | ICD-10-CM

## 2017-09-19 NOTE — Progress Notes (Signed)
   PRENATAL VISIT NOTE  Subjective:  Sandra Harding is a 22 y.o. G2P1001 at [redacted]w[redacted]d being seen today for ongoing prenatal care.  She is currently monitored for the following issues for this low-risk pregnancy and has Flu vaccine need; Supervision of other normal pregnancy, antepartum; Previous cesarean section; History of migraine; Cystic fibrosis gene carrier; [redacted] weeks gestation of pregnancy; and Hereditary disease in family possibly affecting fetus, fetus 1 on their problem list.  Patient reports no complaints.  Contractions: Not present. Vag. Bleeding: None.  Movement: Present. Denies leaking of fluid.   The following portions of the patient's history were reviewed and updated as appropriate: allergies, current medications, past family history, past medical history, past social history, past surgical history and problem list. Problem list updated.  Objective:   Vitals:   09/19/17 0944  BP: 108/66  Pulse: 74  Weight: 152 lb (68.9 kg)    Fetal Status:     Movement: Present     General:  Alert, oriented and cooperative. Patient is in no acute distress.  Skin: Skin is warm and dry. No rash noted.   Cardiovascular: Normal heart rate noted  Respiratory: Normal respiratory effort, no problems with respiration noted  Abdomen: Soft, gravid, appropriate for gestational age.  Pain/Pressure: Absent     Pelvic: Cervical exam deferred        Extremities: Normal range of motion.  Edema: None  Mental Status: Normal mood and affect. Normal behavior. Normal judgment and thought content.   Assessment and Plan:  Pregnancy: G2P1001 at 244w0d  1. Supervision of other normal pregnancy, antepartum - 28 weeks labs at [facility].  - TDaP info given  Preterm labor symptoms and general obstetric precautions including but not limited to vaginal bleeding, contractions, leaking of fluid and fetal movement were reviewed in detail with the patient. Please refer to After Visit Summary for other counseling  recommendations.  Return in about 4 weeks (around 10/17/2017) for ROB/GTT.  Future Appointments  Date Time Provider Department Center  10/17/2017  8:45 AM Donette LarryBhambri, Melanie, CNM [facility]    Dorathy KinsmanVirginia Jahki Witham, [place]

## 2017-09-19 NOTE — Patient Instructions (Signed)
TDaP Vaccine Pregnancy Get the Whooping Cough Vaccine While You Are Pregnant (CDC)  It is important for women to get the whooping cough vaccine in the third trimester of each pregnancy. Vaccines are the best way to prevent this disease. There are 2 different whooping cough vaccines. Both vaccines combine protection against whooping cough, tetanus and diphtheria, but they are for different age groups: Tdap: for everyone 11 years or older, including pregnant women  DTaP: for children 2 months through 6 years of age  You need the whooping cough vaccine during each of your pregnancies The recommended time to get the shot is during your 27th through 36th week of pregnancy, preferably during the earlier part of this time period. The Centers for Disease Control and Prevention (CDC) recommends that pregnant women receive the whooping cough vaccine for adolescents and adults (called Tdap vaccine) during the third trimester of each pregnancy. The recommended time to get the shot is during your 27th through 36th week of pregnancy, preferably during the earlier part of this time period. This replaces the original recommendation that pregnant women get the vaccine only if they had not previously received it. The American College of Obstetricians and Gynecologists and the American College of Nurse-Midwives support this recommendation.  You should get the whooping cough vaccine while pregnant to pass protection to your baby frame support disabled and/or not supported in this browser  Learn why Sandra Harding decided to get the whooping cough vaccine in her 3rd trimester of pregnancy and how her baby girl was born with some protection against the disease. Also available on YouTube. After receiving the whooping cough vaccine, your body will create protective antibodies (proteins produced by the body to fight off diseases) and pass some of them to your baby before birth. These antibodies provide your baby some short-term  protection against whooping cough in early life. These antibodies can also protect your baby from some of the more serious complications that come along with whooping cough. Your protective antibodies are at their highest about 2 weeks after getting the vaccine, but it takes time to pass them to your baby. So the preferred time to get the whooping cough vaccine is early in your third trimester. The amount of whooping cough antibodies in your body decreases over time. That is why CDC recommends you get a whooping cough vaccine during each pregnancy. Doing so allows each of your babies to get the greatest number of protective antibodies from you. This means each of your babies will get the best protection possible against this disease.  Getting the whooping cough vaccine while pregnant is better than getting the vaccine after you give birth Whooping cough vaccination during pregnancy is ideal so your baby will have short-term protection as soon as he is born. This early protection is important because your baby will not start getting his whooping cough vaccines until he is 2 months old. These first few months of life are when your baby is at greatest risk for catching whooping cough. This is also when he's at greatest risk for having severe, potentially life-threating complications from the infection. To avoid that gap in protection, it is best to get a whooping cough vaccine during pregnancy. You will then pass protection to your baby before he is born. To continue protecting your baby, he should get whooping cough vaccines starting at 2 months old. You may never have gotten the Tdap vaccine before and did not get it during this pregnancy. If so, you should make sure   to get the vaccine immediately after you give birth, before leaving the hospital or birthing center. It will take about 2 weeks before your body develops protection (antibodies) in response to the vaccine. Once you have protection from the vaccine,  you are less likely to give whooping cough to your newborn while caring for him. But remember, your baby will still be at risk for catching whooping cough from others. A recent study looked to see how effective Tdap was at preventing whooping cough in babies whose mothers got the vaccine while pregnant or in the hospital after giving birth. The study found that getting Tdap between 27 through 36 weeks of pregnancy is 85% more effective at preventing whooping cough in babies younger than 2 months old. Blood tests cannot tell if you need a whooping cough vaccine There are no blood tests that can tell you if you have enough antibodies in your body to protect yourself or your baby against whooping cough. Even if you have been sick with whooping cough in the past or previously received the vaccine, you still should get the vaccine during each pregnancy. Breastfeeding may pass some protective antibodies onto your baby By breastfeeding, you may pass some antibodies you have made in response to the vaccine to your baby. When you get a whooping cough vaccine during your pregnancy, you will have antibodies in your breast milk that you can share with your baby as soon as your milk comes in. However, your baby will not get protective antibodies immediately if you wait to get the whooping cough vaccine until after delivering your baby. This is because it takes about 2 weeks for your body to create antibodies. Learn more about the health benefits of breastfeeding.  

## 2017-09-22 ENCOUNTER — Encounter: Payer: Self-pay | Admitting: Advanced Practice Midwife

## 2017-10-17 ENCOUNTER — Other Ambulatory Visit (HOSPITAL_COMMUNITY)
Admission: RE | Admit: 2017-10-17 | Discharge: 2017-10-17 | Disposition: A | Discharge status: Home / Self Care | Payer: BLUE CROSS/BLUE SHIELD | Source: Ambulatory Visit | Attending: Certified Nurse Midwife | Admitting: Certified Nurse Midwife

## 2017-10-17 ENCOUNTER — Ambulatory Visit (INDEPENDENT_AMBULATORY_CARE_PROVIDER_SITE_OTHER): Payer: BLUE CROSS/BLUE SHIELD | Admitting: Certified Nurse Midwife

## 2017-10-17 VITALS — BP 107/65 | HR 76 | Wt 159.0 lb

## 2017-10-17 DIAGNOSIS — Z3A27 27 weeks gestation of pregnancy: Secondary | ICD-10-CM | POA: Insufficient documentation

## 2017-10-17 DIAGNOSIS — N898 Other specified noninflammatory disorders of vagina: Secondary | ICD-10-CM

## 2017-10-17 DIAGNOSIS — O26899 Other specified pregnancy related conditions, unspecified trimester: Secondary | ICD-10-CM

## 2017-10-17 DIAGNOSIS — Z98891 History of uterine scar from previous surgery: Secondary | ICD-10-CM

## 2017-10-17 DIAGNOSIS — Z3480 Encounter for supervision of other normal pregnancy, unspecified trimester: Secondary | ICD-10-CM

## 2017-10-17 DIAGNOSIS — O26892 Other specified pregnancy related conditions, second trimester: Secondary | ICD-10-CM

## 2017-10-17 DIAGNOSIS — Z3482 Encounter for supervision of other normal pregnancy, second trimester: Secondary | ICD-10-CM

## 2017-10-17 NOTE — Progress Notes (Signed)
Subjective:  Sandra Harding is a 22 y.o. G2P1001 at [redacted]w[redacted]d being seen today for ongoing prenatal care.  She is currently monitored for the following issues for this low-risk pregnancy and has Supervision of other normal pregnancy, antepartum; Previous cesarean section; History of migraine; Cystic fibrosis gene carrier; and Hereditary disease in family possibly affecting fetus, fetus 1 on their problem list.  Patient reports thick white-yellow vaginal discharge with odor at times.  Contractions: Not present. Vag. Bleeding: None.  Movement: Present. Denies leaking of fluid.   The following portions of the patient's history were reviewed and updated as appropriate: allergies, current medications, past family history, past medical history, past social history, past surgical history and problem list. Problem list updated.  Objective:   Vitals:   10/17/17 0848  BP: 107/65  Pulse: 76  Weight: 72.1 kg    Fetal Status: Fetal Heart Rate (bpm): 142   Movement: Present     General:  Alert, oriented and cooperative. Patient is in no acute distress.  Skin: Skin is warm and dry. No rash noted.   Cardiovascular: Normal heart rate noted  Respiratory: Normal respiratory effort, no problems with respiration noted  Abdomen: Soft, gravid, appropriate for gestational age. Pain/Pressure: Absent     Pelvic: Vag. Bleeding: None Vag D/C Character: Thin   Cervical exam deferred        Extremities: Normal range of motion.  Edema: None  Mental Status: Normal mood and affect. Normal behavior. Normal judgment and thought content.   Urinalysis:      Assessment and Plan:  Pregnancy: G2P1001 at [redacted]w[redacted]d  1. Supervision of other normal pregnancy, antepartum - needs to schedule GTT, wasn't fasting today  2. Previous cesarean section - planning TOLAC, consent signed  Preterm labor symptoms and general obstetric precautions including but not limited to vaginal bleeding, contractions, leaking of fluid and fetal  movement were reviewed in detail with the patient. Please refer to After Visit Summary for other counseling recommendations.  Return in about 2 weeks (around 10/31/2017).   Donette Larry, CNM

## 2017-10-17 NOTE — Addendum Note (Signed)
Addended by: Kathie Dike on: 10/17/2017 09:35 AM   Modules accepted: Orders

## 2017-10-20 ENCOUNTER — Ambulatory Visit (INDEPENDENT_AMBULATORY_CARE_PROVIDER_SITE_OTHER): Payer: BLUE CROSS/BLUE SHIELD | Admitting: *Deleted

## 2017-10-20 DIAGNOSIS — Z23 Encounter for immunization: Secondary | ICD-10-CM | POA: Diagnosis not present

## 2017-10-20 DIAGNOSIS — Z3480 Encounter for supervision of other normal pregnancy, unspecified trimester: Secondary | ICD-10-CM

## 2017-10-20 LAB — CERVICOVAGINAL ANCILLARY ONLY
BACTERIAL VAGINITIS: POSITIVE — AB
CANDIDA VAGINITIS: NEGATIVE
Chlamydia: NEGATIVE
Neisseria Gonorrhea: NEGATIVE
TRICH (WINDOWPATH): NEGATIVE

## 2017-10-20 NOTE — Addendum Note (Signed)
Addended by: Mariel Aloe L on: 10/20/2017 11:00 AM   Modules accepted: Orders

## 2017-10-20 NOTE — Progress Notes (Signed)
Pt here for 28 weeks labs and Tdap.  She will opt for Flu vaccine at next appt.

## 2017-10-21 ENCOUNTER — Telehealth: Payer: Self-pay

## 2017-10-21 DIAGNOSIS — B9689 Other specified bacterial agents as the cause of diseases classified elsewhere: Secondary | ICD-10-CM

## 2017-10-21 DIAGNOSIS — N760 Acute vaginitis: Principal | ICD-10-CM

## 2017-10-21 LAB — 2HR GTT W 1 HR, CARPENTER, 75 G
GLUCOSE, 1 HR, GEST: 133 mg/dL (ref 65–179)
GLUCOSE, 2 HR, GEST: 82 mg/dL (ref 65–152)
Glucose, Fasting, Gest: 62 mg/dL — ABNORMAL LOW (ref 65–91)

## 2017-10-21 LAB — CBC
HEMATOCRIT: 31.1 % — AB (ref 35.0–45.0)
HEMOGLOBIN: 10.9 g/dL — AB (ref 11.7–15.5)
MCH: 30.9 pg (ref 27.0–33.0)
MCHC: 35 g/dL (ref 32.0–36.0)
MCV: 88.1 fL (ref 80.0–100.0)
MPV: 10.5 fL (ref 7.5–12.5)
Platelets: 253 10*3/uL (ref 140–400)
RBC: 3.53 10*6/uL — AB (ref 3.80–5.10)
RDW: 11.8 % (ref 11.0–15.0)
WBC: 8.3 10*3/uL (ref 3.8–10.8)

## 2017-10-21 LAB — RPR: RPR: NONREACTIVE

## 2017-10-21 LAB — HIV ANTIBODY (ROUTINE TESTING W REFLEX): HIV 1&2 Ab, 4th Generation: NONREACTIVE

## 2017-10-21 MED ORDER — METRONIDAZOLE 500 MG PO TABS: 500.0000 mg | ORAL_TABLET | Freq: Two times a day (BID) | ORAL | 0 refills | Status: DC

## 2017-10-21 NOTE — Telephone Encounter (Signed)
Spoke with pt and she is aware of positive BV results and she is aware that Flagyl has been sent to her pharmacy

## 2017-10-31 ENCOUNTER — Ambulatory Visit (INDEPENDENT_AMBULATORY_CARE_PROVIDER_SITE_OTHER): Payer: BLUE CROSS/BLUE SHIELD | Admitting: Certified Nurse Midwife

## 2017-10-31 DIAGNOSIS — Z23 Encounter for immunization: Secondary | ICD-10-CM

## 2017-10-31 DIAGNOSIS — Z3480 Encounter for supervision of other normal pregnancy, unspecified trimester: Secondary | ICD-10-CM

## 2017-10-31 DIAGNOSIS — Z3483 Encounter for supervision of other normal pregnancy, third trimester: Secondary | ICD-10-CM

## 2017-10-31 NOTE — Progress Notes (Signed)
Subjective:  Sandra Harding is a 22 y.o. G2P1001 at [redacted]w[redacted]d being seen today for ongoing prenatal care.  She is currently monitored for the following issues for this low-risk pregnancy and has Supervision of other normal pregnancy, antepartum; Previous cesarean section; History of migraine; Cystic fibrosis gene carrier; and Hereditary disease in family possibly affecting fetus, fetus 1 on their problem list.  Patient reports no complaints.  Contractions: Not present. Vag. Bleeding: None.  Movement: Present. Denies leaking of fluid.   The following portions of the patient's history were reviewed and updated as appropriate: allergies, current medications, past family history, past medical history, past social history, past surgical history and problem list. Problem list updated.  Objective:   Vitals:   10/31/17 0903  BP: 128/75  Pulse: 75  Weight: 75.3 kg    Fetal Status: Fetal Heart Rate (bpm): 144 Fundal Height: 29 cm Movement: Present     General:  Alert, oriented and cooperative. Patient is in no acute distress.  Skin: Skin is warm and dry. No rash noted.   Cardiovascular: Normal heart rate noted  Respiratory: Normal respiratory effort, no problems with respiration noted  Abdomen: Soft, gravid, appropriate for gestational age. Pain/Pressure: Absent     Pelvic: Vag. Bleeding: None Vag D/C Character: Thin   Cervical exam deferred        Extremities: Normal range of motion.     Mental Status: Normal mood and affect. Normal behavior. Normal judgment and thought content.   Urinalysis:      Assessment and Plan:  Pregnancy: G2P1001 at [redacted]w[redacted]d  1. Supervision of other normal pregnancy, antepartum - pt worried about weight gain, TWG 27 lbs, admits to high carb diet - discussed lowering carbs and increasing protein in diet, inc water intake, dec sugary drinks  Preterm labor symptoms and general obstetric precautions including but not limited to vaginal bleeding, contractions, leaking of  fluid and fetal movement were reviewed in detail with the patient. Please refer to After Visit Summary for other counseling recommendations.  Return in about 2 weeks (around 11/14/2017).   Donette Larry, CNM

## 2017-11-14 ENCOUNTER — Encounter: Payer: Self-pay | Admitting: Certified Nurse Midwife

## 2017-11-18 ENCOUNTER — Encounter: Payer: Self-pay | Admitting: Certified Nurse Midwife

## 2017-11-18 ENCOUNTER — Ambulatory Visit (INDEPENDENT_AMBULATORY_CARE_PROVIDER_SITE_OTHER): Payer: BLUE CROSS/BLUE SHIELD | Admitting: Certified Nurse Midwife

## 2017-11-18 VITALS — BP 118/60 | HR 72 | Wt 170.0 lb

## 2017-11-18 DIAGNOSIS — Z3480 Encounter for supervision of other normal pregnancy, unspecified trimester: Secondary | ICD-10-CM

## 2017-11-18 DIAGNOSIS — Z98891 History of uterine scar from previous surgery: Secondary | ICD-10-CM

## 2017-11-18 NOTE — Patient Instructions (Signed)

## 2017-11-18 NOTE — Progress Notes (Signed)
   PRENATAL VISIT NOTE  Subjective:  Sandra Harding is a 22 y.o. G2P1001 at [redacted]w[redacted]d being seen today for ongoing prenatal care.  She is currently monitored for the following issues for this low-risk pregnancy and has Supervision of other normal pregnancy, antepartum; Previous cesarean section; History of migraine; Cystic fibrosis gene carrier; and Hereditary disease in family possibly affecting fetus, fetus 1 on their problem list.  Patient reports no complaints.  Contractions: Not present. Vag. Bleeding: None.  Movement: Present. Denies leaking of fluid.   The following portions of the patient's history were reviewed and updated as appropriate: allergies, current medications, past family history, past medical history, past social history, past surgical history and problem list. Problem list updated.  Objective:   Vitals:   11/18/17 1004  BP: 118/60  Pulse: 72  Weight: 170 lb (77.1 kg)    Fetal Status: Fetal Heart Rate (bpm): 148 Fundal Height: 31 cm Movement: Present     General:  Alert, oriented and cooperative. Patient is in no acute distress.  Skin: Skin is warm and dry. No rash noted.   Cardiovascular: Normal heart rate noted  Respiratory: Normal respiratory effort, no problems with respiration noted  Abdomen: Soft, gravid, appropriate for gestational age.  Pain/Pressure: Present     Pelvic: Cervical exam deferred        Extremities: Normal range of motion.  Edema: None  Mental Status: Normal mood and affect. Normal behavior. Normal judgment and thought content.   Assessment and Plan:  Pregnancy: G2P1001 at 109w4d  1. Supervision of other normal pregnancy, antepartum - patient doing well, nervous about TOLAC due to hx of C/S  - Educated on TOLAC and answered patient's questions  - Anticipatory guidance on upcoming appointments   2. Previous cesarean section - Plans TOLAC, consent already signed  - C/S due to Fetal intolerance of labor   Preterm labor symptoms and general  obstetric precautions including but not limited to vaginal bleeding, contractions, leaking of fluid and fetal movement were reviewed in detail with the patient. Please refer to After Visit Summary for other counseling recommendations.  Return in about 2 weeks (around 12/02/2017) for ROB.  Future Appointments  Date Time Provider Department Center  12/05/2017  9:15 AM Rolm Bookbinder, CNM [facility]    Sharyon Cable, CNM

## 2017-12-05 ENCOUNTER — Encounter: Payer: Self-pay | Admitting: *Deleted

## 2017-12-05 ENCOUNTER — Ambulatory Visit (INDEPENDENT_AMBULATORY_CARE_PROVIDER_SITE_OTHER): Payer: BLUE CROSS/BLUE SHIELD

## 2017-12-05 VITALS — BP 126/72 | HR 78 | Wt 171.0 lb

## 2017-12-05 DIAGNOSIS — Z3480 Encounter for supervision of other normal pregnancy, unspecified trimester: Secondary | ICD-10-CM

## 2017-12-05 DIAGNOSIS — Z98891 History of uterine scar from previous surgery: Secondary | ICD-10-CM

## 2017-12-05 DIAGNOSIS — Z3483 Encounter for supervision of other normal pregnancy, third trimester: Secondary | ICD-10-CM

## 2017-12-05 NOTE — Progress Notes (Signed)
   PRENATAL VISIT NOTE  Subjective:  Sandra Harding is a 22 y.o. G2P1001 at 1970w0d being seen today for ongoing prenatal care.  She is currently monitored for the following issues for this low-risk pregnancy and has Supervision of other normal pregnancy, antepartum; Previous cesarean section; History of migraine; Cystic fibrosis gene carrier; and Hereditary disease in family possibly affecting fetus, fetus 1 on their problem list.  Patient reports intermittent shortness of breath when sitting or lying down. States she feels like she is not able to take a deep breath.  Contractions: Not present. Vag. Bleeding: None.  Movement: Present. Denies leaking of fluid.   The following portions of the patient's history were reviewed and updated as appropriate: allergies, current medications, past family history, past medical history, past social history, past surgical history and problem list. Problem list updated.  Objective:   Vitals:   12/05/17 0842  BP: 126/72  Pulse: 78  Weight: 171 lb (77.6 kg)    Fetal Status: Fetal Heart Rate (bpm): 137 Fundal Height: 34 cm Movement: Present     General:  Alert, oriented and cooperative. Patient is in no acute distress.  Skin: Skin is warm and dry. No rash noted.   Cardiovascular: Normal heart rate noted  Respiratory: Normal respiratory effort, no problems with respiration noted  Abdomen: Soft, gravid, appropriate for gestational age.  Pain/Pressure: Absent     Pelvic: Cervical exam deferred        Extremities: Normal range of motion.  Edema: None  Mental Status: Normal mood and affect. Normal behavior. Normal judgment and thought content.   Assessment and Plan:  Pregnancy: G2P1001 at 2070w0d  1. Supervision of other normal pregnancy, antepartum - Discussed position changes and normalcy of feeling short of breath   2. Previous cesarean section - Patient nervous about TOLAC. Reviewed risks and benefits of both again. Patient still desires  TOLAC  Preterm labor symptoms and general obstetric precautions including but not limited to vaginal bleeding, contractions, leaking of fluid and fetal movement were reviewed in detail with the patient. Please refer to After Visit Summary for other counseling recommendations.  Return in about 2 weeks (around 12/19/2017) for Return OB visit with GBS.  Rolm BookbinderCaroline M Zeynep Fantroy, CNM 12/05/17 9:31 AM

## 2017-12-05 NOTE — Progress Notes (Signed)
Pt c/o's some SOB.  Pulse Ox 99 %

## 2017-12-05 NOTE — Patient Instructions (Signed)

## 2017-12-23 ENCOUNTER — Other Ambulatory Visit (HOSPITAL_COMMUNITY)
Admission: RE | Admit: 2017-12-23 | Discharge: 2017-12-23 | Disposition: A | Discharge status: Home / Self Care | Payer: BLUE CROSS/BLUE SHIELD | Source: Ambulatory Visit | Attending: Advanced Practice Midwife | Admitting: Advanced Practice Midwife

## 2017-12-23 ENCOUNTER — Ambulatory Visit (INDEPENDENT_AMBULATORY_CARE_PROVIDER_SITE_OTHER): Payer: BLUE CROSS/BLUE SHIELD | Admitting: Advanced Practice Midwife

## 2017-12-23 VITALS — BP 121/72 | HR 84 | Wt 179.0 lb

## 2017-12-23 DIAGNOSIS — O26899 Other specified pregnancy related conditions, unspecified trimester: Secondary | ICD-10-CM | POA: Insufficient documentation

## 2017-12-23 DIAGNOSIS — Z3480 Encounter for supervision of other normal pregnancy, unspecified trimester: Secondary | ICD-10-CM

## 2017-12-23 DIAGNOSIS — N898 Other specified noninflammatory disorders of vagina: Secondary | ICD-10-CM

## 2017-12-23 DIAGNOSIS — R51 Headache: Secondary | ICD-10-CM

## 2017-12-23 DIAGNOSIS — O26893 Other specified pregnancy related conditions, third trimester: Secondary | ICD-10-CM

## 2017-12-23 DIAGNOSIS — Z3483 Encounter for supervision of other normal pregnancy, third trimester: Secondary | ICD-10-CM

## 2017-12-23 NOTE — Progress Notes (Signed)
   PRENATAL VISIT NOTE  Subjective:  Sandra Harding is a 22 y.o. G2P1001 at 2217w4d being seen today for ongoing prenatal care.  She is currently monitored for the following issues for this low-risk pregnancy and has Supervision of other normal pregnancy, antepartum; Previous cesarean section; History of migraine; Cystic fibrosis gene carrier; and Hereditary disease in family possibly affecting fetus, fetus 1 on their problem list.  Patient reports occasional contractions.  Contractions: Irritability. Vag. Bleeding: None.  Movement: Present. Denies leaking of fluid.   The following portions of the patient's history were reviewed and updated as appropriate: allergies, current medications, past family history, past medical history, past social history, past surgical history and problem list. Problem list updated.  Objective:   Vitals:   12/23/17 0835  BP: 121/72  Pulse: 84  Weight: 81.2 kg    Fetal Status: Fetal Heart Rate (bpm): 141(Simultaneous filing. User may not have seen previous data.) Fundal Height: 36 cm Movement: Present  Presentation: Vertex  General:  Alert, oriented and cooperative. Patient is in no acute distress.  Skin: Skin is warm and dry. No rash noted.   Cardiovascular: Normal heart rate noted  Respiratory: Normal respiratory effort, no problems with respiration noted  Abdomen: Soft, gravid, appropriate for gestational age.  Pain/Pressure: Present     Pelvic: Cervical exam deferred Dilation: Closed Effacement (%): 60 Station: -3  Extremities: Normal range of motion.  Edema: Trace  Mental Status: Normal mood and affect. Normal behavior. Normal judgment and thought content.   Assessment and Plan:  Pregnancy: G2P1001 at 10117w4d  1. Supervision of other normal pregnancy, antepartum --Anticipatory guidance about next visits/weeks of pregnancy given. - Culture, beta strep (group b only) - Cervicovaginal ancillary only( [place])   Term labor symptoms and general  obstetric precautions including but not limited to vaginal bleeding, contractions, leaking of fluid and fetal movement were reviewed in detail with the patient. Please refer to After Visit Summary for other counseling recommendations.  Return in about 1 week (around 12/30/2017).  Future Appointments  Date Time Provider Department Center  01/02/2018  8:45 AM Rolm BookbinderNeill, Caroline M, CNM [facility]    Sharen CounterLisa Leftwich-Kirby, CNM

## 2017-12-23 NOTE — Patient Instructions (Signed)

## 2017-12-24 LAB — CERVICOVAGINAL ANCILLARY ONLY
BACTERIAL VAGINITIS: NEGATIVE
CANDIDA VAGINITIS: NEGATIVE
Chlamydia: NEGATIVE
NEISSERIA GONORRHEA: NEGATIVE

## 2017-12-26 ENCOUNTER — Encounter (HOSPITAL_COMMUNITY): Payer: Self-pay | Admitting: *Deleted

## 2017-12-26 ENCOUNTER — Inpatient Hospital Stay (HOSPITAL_COMMUNITY)
Admission: AD | Admit: 2017-12-26 | Discharge: 2017-12-26 | Disposition: A | Discharge status: Home / Self Care | Payer: BLUE CROSS/BLUE SHIELD | Attending: Obstetrics & Gynecology | Admitting: Obstetrics & Gynecology

## 2017-12-26 DIAGNOSIS — Z0371 Encounter for suspected problem with amniotic cavity and membrane ruled out: Secondary | ICD-10-CM

## 2017-12-26 DIAGNOSIS — Z3689 Encounter for other specified antenatal screening: Secondary | ICD-10-CM

## 2017-12-26 DIAGNOSIS — O26893 Other specified pregnancy related conditions, third trimester: Secondary | ICD-10-CM | POA: Diagnosis not present

## 2017-12-26 DIAGNOSIS — Z98891 History of uterine scar from previous surgery: Secondary | ICD-10-CM

## 2017-12-26 DIAGNOSIS — Z3A37 37 weeks gestation of pregnancy: Secondary | ICD-10-CM | POA: Insufficient documentation

## 2017-12-26 DIAGNOSIS — Z141 Cystic fibrosis carrier: Secondary | ICD-10-CM

## 2017-12-26 DIAGNOSIS — Z3480 Encounter for supervision of other normal pregnancy, unspecified trimester: Secondary | ICD-10-CM

## 2017-12-26 LAB — URINALYSIS, ROUTINE W REFLEX MICROSCOPIC
Bilirubin Urine: NEGATIVE
Glucose, UA: NEGATIVE mg/dL
Hgb urine dipstick: NEGATIVE
Ketones, ur: NEGATIVE mg/dL
Nitrite: NEGATIVE
Protein, ur: NEGATIVE mg/dL
Specific Gravity, Urine: 1.02 (ref 1.005–1.030)
pH: 6 (ref 5.0–8.0)

## 2017-12-26 LAB — CULTURE, BETA STREP (GROUP B ONLY)
MICRO NUMBER:: 91446067
SPECIMEN QUALITY: ADEQUATE

## 2017-12-26 LAB — AMNISURE RUPTURE OF MEMBRANE (ROM) NOT AT ARMC: Amnisure ROM: NEGATIVE

## 2017-12-26 LAB — POCT FERN TEST: POCT Fern Test: NEGATIVE

## 2017-12-26 NOTE — MAU Provider Note (Signed)
First Provider Initiated Contact with Patient 12/26/17 1756     S: [name] is a 22 y.o. G2P1001 at 194w0d  who presents to MAU today complaining of leaking of fluid for the past 2-3 days. Denies having a gush of fluid. She reports trickle of fluid and having to change her underwear for the past 2 days. She denies vaginal bleeding. She denies contractions. Reports mild cramping. She reports normal fetal movement.  [facility]   O: BP 119/71   Pulse 97   Temp 98.1 F (36.7 C)   Resp 18   Ht 5\' 2"  (1.575 m)   Wt 82.6 kg   LMP 04/11/2017 (Exact Date)   BMI 33.29 kg/m  GENERAL: Well-developed, well-nourished female in no acute distress.  HEAD: Normocephalic, atraumatic.  CHEST: Normal effort of breathing, regular heart rate ABDOMEN: Soft, nontender, gravid PELVIC: Normal external female genitalia. Vagina is pink and rugated. Cervix with normal contour, no lesions. Small amount of white thin discharge without odor.  Negative pooling.   Cervical exam:  Dilation: Fingertip Exam by:: V Lejon Afzal RNC   Fetal Monitoring: Baseline: 150 Variability: moderate Accelerations: present Decelerations: none Contractions: none  Results for orders placed or performed during the hospital encounter of 12/26/17 (from the past 24 hour(s))  Urinalysis, Routine w reflex microscopic     Status: Abnormal   Collection Time: 12/26/17  5:38 PM  Result Value Ref Range   Color, Urine YELLOW YELLOW   APPearance HAZY (A) CLEAR   Specific Gravity, Urine 1.020 1.005 - 1.030   pH 6.0 5.0 - 8.0   Glucose, UA NEGATIVE NEGATIVE mg/dL   Hgb urine dipstick NEGATIVE NEGATIVE   Bilirubin Urine NEGATIVE NEGATIVE   Ketones, ur NEGATIVE NEGATIVE mg/dL   Protein, ur NEGATIVE NEGATIVE mg/dL   Nitrite NEGATIVE NEGATIVE   Leukocytes, UA SMALL (A) NEGATIVE   RBC / HPF 0-5 0 - 5 RBC/hpf   WBC, UA 11-20 0 - 5 WBC/hpf   Bacteria, UA FEW (A) NONE SEEN   Squamous Epithelial / LPF 11-20 0 - 5   Mucus PRESENT   POCT fern test     Status: None   Collection Time: 12/26/17  6:10 PM  Result Value Ref Range   POCT Fern Test Negative = intact amniotic membranes   [facility]     Status: None   Collection Time: 12/26/17  6:18 PM  Result Value Ref Range   Amnisure ROM NEGATIVE     A: SIUP at 714w0d  Membranes intact  P: Discharge home  Follow up as scheduled for prenatal appointments Labor precautions  Discussed reasons to return to MAU   Sharyon CableRogers, Itay Mella C, CNM 12/26/2017 6:41 PM

## 2017-12-26 NOTE — MAU Note (Signed)
Pt stated she has been having small amount of leaking for the past 2 days. Unsure if her water broke. Reports some light cramping/contractions. Reports fetal movement not as active but still moving regular

## 2017-12-26 NOTE — Discharge Instructions (Signed)
Reasons to return to MAU: ° °1.  Contractions are 4-5 minutes apart or less, each last 1 minute, these have been going on for 1-2 hours, and you cannot walk or talk during them °2.  You have a large gush of fluid, or a trickle of fluid that will not stop and you have to wear a pad °3.  You have bleeding that is bright red, heavier than spotting--like menstrual bleeding (spotting can be normal in early labor or after a check of your cervix) °4.  You do not feel the baby moving like he/she normally does ° °

## 2017-12-28 LAB — CULTURE, OB URINE: Culture: 50000 — AB

## 2018-01-04 ENCOUNTER — Inpatient Hospital Stay (HOSPITAL_COMMUNITY)
Admission: AD | Admit: 2018-01-04 | Discharge: 2018-01-04 | Disposition: A | Discharge status: Home / Self Care | Payer: BLUE CROSS/BLUE SHIELD | Source: Ambulatory Visit | Attending: Obstetrics & Gynecology | Admitting: Obstetrics & Gynecology

## 2018-01-04 ENCOUNTER — Encounter (HOSPITAL_COMMUNITY): Payer: Self-pay | Admitting: *Deleted

## 2018-01-04 DIAGNOSIS — Z331 Pregnant state, incidental: Secondary | ICD-10-CM | POA: Insufficient documentation

## 2018-01-04 DIAGNOSIS — R197 Diarrhea, unspecified: Secondary | ICD-10-CM

## 2018-01-04 DIAGNOSIS — Z3A38 38 weeks gestation of pregnancy: Secondary | ICD-10-CM | POA: Diagnosis not present

## 2018-01-04 DIAGNOSIS — O368130 Decreased fetal movements, third trimester, not applicable or unspecified: Secondary | ICD-10-CM | POA: Diagnosis not present

## 2018-01-04 DIAGNOSIS — O26893 Other specified pregnancy related conditions, third trimester: Secondary | ICD-10-CM

## 2018-01-04 DIAGNOSIS — R109 Unspecified abdominal pain: Secondary | ICD-10-CM

## 2018-01-04 LAB — URINALYSIS, ROUTINE W REFLEX MICROSCOPIC
BILIRUBIN URINE: NEGATIVE
GLUCOSE, UA: NEGATIVE mg/dL
Hgb urine dipstick: NEGATIVE
KETONES UR: NEGATIVE mg/dL
Leukocytes, UA: NEGATIVE
Nitrite: NEGATIVE
PH: 5.5 (ref 5.0–8.0)
Protein, ur: NEGATIVE mg/dL
SPECIFIC GRAVITY, URINE: 1.02 (ref 1.005–1.030)

## 2018-01-04 LAB — COMPREHENSIVE METABOLIC PANEL
ALBUMIN: 3.1 g/dL — AB (ref 3.5–5.0)
ALK PHOS: 153 U/L — AB (ref 38–126)
ALT: 13 U/L (ref 0–44)
ANION GAP: 10 (ref 5–15)
AST: 17 U/L (ref 15–41)
BILIRUBIN TOTAL: 0.7 mg/dL (ref 0.3–1.2)
BUN: 8 mg/dL (ref 6–20)
CALCIUM: 8.8 mg/dL — AB (ref 8.9–10.3)
CO2: 18 mmol/L — ABNORMAL LOW (ref 22–32)
Chloride: 104 mmol/L (ref 98–111)
Creatinine, Ser: 0.54 mg/dL (ref 0.44–1.00)
GFR calc Af Amer: >60 mL/min (ref >60)
GLUCOSE: 79 mg/dL (ref 70–99)
Potassium: 3.7 mmol/L (ref 3.5–5.1)
Sodium: 132 mmol/L — ABNORMAL LOW (ref 135–145)
TOTAL PROTEIN: 7 g/dL (ref 6.5–8.1)

## 2018-01-04 MED ORDER — LOPERAMIDE HCL 2 MG PO CAPS
4.0000 mg | ORAL_CAPSULE | Freq: Once | ORAL | Status: AC
  Administered 2018-01-04: 4 mg via ORAL
  Filled 2018-01-04: qty 2

## 2018-01-04 MED ORDER — ONDANSETRON HCL 4 MG PO TABS
8.0000 mg | ORAL_TABLET | Freq: Once | ORAL | Status: AC
  Administered 2018-01-04: 8 mg via ORAL
  Filled 2018-01-04: qty 2

## 2018-01-04 MED ORDER — ONDANSETRON 8 MG PO TBDP: 8.0000 mg | ORAL_TABLET | Freq: Three times a day (TID) | ORAL | 2 refills | Status: DC | PRN

## 2018-01-04 NOTE — Discharge Instructions (Signed)
Viral Gastroenteritis, Adult  Viral gastroenteritis is also known as the stomach flu. This condition is caused by various viruses. These viruses can be passed from person to person very easily (are very contagious). This condition may affect your stomach, small intestine, and large intestine. It can cause sudden watery diarrhea, fever, and vomiting.  Diarrhea and vomiting can make you feel weak and cause you to become dehydrated. You may not be able to keep fluids down. Dehydration can make you tired and thirsty, cause you to have a dry mouth, and decrease how often you urinate. Older adults and people with other diseases or a weak immune system are at higher risk for dehydration.  It is important to replace the fluids that you lose from diarrhea and vomiting. If you become severely dehydrated, you may need to get fluids through an IV tube.  What are the causes?  Gastroenteritis is caused by various viruses, including rotavirus and norovirus. Norovirus is the most common cause in adults.  You can get sick by eating food, drinking water, or touching a surface contaminated with one of these viruses. You can also get sick from sharing utensils or other personal items with an infected person.  What increases the risk?  This condition is more likely to develop in people:  · Who have a weak defense system (immune system).  · Who live with one or more children who are younger than 2 years old.  · Who live in a nursing home.  · Who go on cruise ships.    What are the signs or symptoms?  Symptoms of this condition start suddenly 1-2 days after exposure to a virus. Symptoms may last a few days or as long as a week. The most common symptoms are watery diarrhea and vomiting. Other symptoms include:  · Fever.  · Headache.  · Fatigue.  · Pain in the abdomen.  · Chills.  · Weakness.  · Nausea.  · Muscle aches.  · Loss of appetite.    How is this diagnosed?  This condition is diagnosed with a medical history and physical exam. You  may also have a stool test to check for viruses or other infections.  How is this treated?  This condition typically goes away on its own. The focus of treatment is to restore lost fluids (rehydration). Your health care provider may recommend that you take an oral rehydration solution (ORS) to replace important salts and minerals (electrolytes) in your body. Severe cases of this condition may require giving fluids through an IV tube.  Treatment may also include medicine to help with your symptoms.  Follow these instructions at home:  Follow instructions from your health care provider about how to care for yourself at home.  Eating and drinking  Follow these recommendations as told by your health care provider:  · Take an ORS. This is a drink that is sold at pharmacies and retail stores.  · Drink clear fluids in small amounts as you are able. Clear fluids include water, ice chips, diluted fruit juice, and low-calorie sports drinks.  · Eat bland, easy-to-digest foods in small amounts as you are able. These foods include bananas, applesauce, rice, lean meats, toast, and crackers.  · Avoid fluids that contain a lot of sugar or caffeine, such as energy drinks, sports drinks, and soda.  · Avoid alcohol.  · Avoid spicy or fatty foods.    General instructions    · Drink enough fluid to keep your urine clear or   pale yellow.  · Wash your hands often. If soap and water are not available, use hand sanitizer.  · Make sure that all people in your household wash their hands well and often.  · Take over-the-counter and prescription medicines only as told by your health care provider.  · Rest at home while you recover.  · Watch your condition for any changes.  · Take a warm bath to relieve any burning or pain from frequent diarrhea episodes.  · Keep all follow-up visits as told by your health care provider. This is important.  Contact a health care provider if:  · You cannot keep fluids down.  · Your symptoms get worse.  · You have  new symptoms.  · You feel light-headed or dizzy.  · You have muscle cramps.  Get help right away if:  · You have chest pain.  · You feel extremely weak or you faint.  · You see blood in your vomit.  · Your vomit looks like coffee grounds.  · You have bloody or black stools or stools that look like tar.  · You have a severe headache, a stiff neck, or both.  · You have a rash.  · You have severe pain, cramping, or bloating in your abdomen.  · You have trouble breathing or you are breathing very quickly.  · Your heart is beating very quickly.  · Your skin feels cold and clammy.  · You feel confused.  · You have pain when you urinate.  · You have signs of dehydration, such as:  ? Dark urine, very little urine, or no urine.  ? Cracked lips.  ? Dry mouth.  ? Sunken eyes.  ? Sleepiness.  ? Weakness.  This information is not intended to replace advice given to you by your health care provider. Make sure you discuss any questions you have with your health care provider.  Document Released: 01/07/2005 Document Revised: 06/21/2015 Document Reviewed: 09/13/2014  Elsevier Interactive Patient Education © 2018 [employer]

## 2018-01-04 NOTE — MAU Note (Signed)
Has had diarrhea about 15 times since last night, watery  Since 0500 has had sharp pain at the top of her uterus, intermittent, 6/10  No vaginal bleeding, no LOF  Has felt some movement this AM but not as much as normal

## 2018-01-04 NOTE — MAU Provider Note (Signed)
Chief Complaint:  Diarrhea and Abdominal Pain   First Provider Initiated Contact with Patient 01/04/18 1431      HPI: Sandra Harding is a 22 y.o. G2P1001 at [redacted]w[redacted]d who presents to maternity admissions reporting 15 episodes of watery diarrhea and intermittent upper abd cramping today. Cramping precedes episodes of diarrhea and nausea, then resolves. Her father had same Sx.   Also reports decreased fetal mvmt today.  Duration: <24 hours Modifying factors: Hasnt tried anything to treat Sx. Associated signs and symptoms: pos for mild nausea, but is able to drink well. Neg for fever, chills, vomiting, contractions, VB, LOF, urinary complaints.  Past Medical History:  Diagnosis Date  . Medical history non-contributory    OB History  Gravida Para Term Preterm AB Living  2 1 1     1   SAB TAB Ectopic Multiple Live Births        0 1    # Outcome Date GA Lbr Len/2nd Weight Sex Delivery Anes PTL Lv  2 Current           1 Term 09/27/13 [redacted]w[redacted]d  2571 g F CS-LTranv Gen  LIV     Complications: Fetal Intolerance   Past Surgical History:  Procedure Laterality Date  . CESAREAN SECTION N/A 09/27/2013   Procedure: CESAREAN SECTION;  Surgeon: Lesly Dukes, MD;  Location: [facility];  Service: Obstetrics;  Laterality: N/A;  . CESAREAN SECTION  2015  . NO PAST SURGERIES     Family History  Problem Relation Age of Onset  . Cancer Maternal Grandfather   . Diabetes Paternal Aunt   . Heart attack Maternal Grandmother   . Alzheimer's disease Paternal Grandfather   . Cancer Maternal Uncle    Social History   Tobacco Use  . Smoking status: Former Games developer  . Smokeless tobacco: Never Used  Substance Use Topics  . Alcohol use: No  . Drug use: Not Currently    Types: Marijuana   Allergies  Allergen Reactions  . Sulfa Antibiotics Hives and Rash   Medications Prior to Admission  Medication Sig Dispense Refill Last Dose  . Prenatal Vit-Fe Fumarate-FA (MULTIVITAMIN-PRENATAL) 27-0.8 MG TABS tablet Take  1 tablet by mouth daily at 12 noon.   Past Month at Unknown time    I have reviewed patient's Past Medical Hx, Surgical Hx, Family Hx, Social Hx, medications and allergies.   ROS:  Review of Systems  Constitutional: Negative for appetite change, chills and fever.  HENT: Negative for congestion, rhinorrhea and sore throat.   Gastrointestinal: Positive for abdominal pain, diarrhea and nausea. Negative for abdominal distention, blood in stool, constipation and vomiting.  Genitourinary: Negative for dysuria, flank pain, frequency, hematuria, pelvic pain, urgency and vaginal bleeding.  Musculoskeletal: Negative for back pain.    Physical Exam   Patient Vitals for the past 24 hrs:  BP Temp Temp src Pulse Resp Weight  01/04/18 1318 132/82 97.6 F (36.4 C) Oral 82 18 82.1 kg   Constitutional: Well-developed, well-nourished female in no acute distress.  Skin: Mucus membranes moist Cardiovascular: normal rate Respiratory: normal effort GI: Abd soft, non-tender, gravid appropriate for gestational age. Pos BS x 4 Neurologic: Alert and oriented x 4.  GU: Neg CVAT.  Pelvic: Offered, declined  FHT:  Baseline 125 , moderate variability, accelerations present, no decelerations Contractions: UI   Labs: Results for orders placed or performed during the hospital encounter of 01/04/18 (from the past 24 hour(s))  Urinalysis, Routine w reflex microscopic  Status: None   Collection Time: 01/04/18  1:44 PM  Result Value Ref Range   Color, Urine YELLOW YELLOW   APPearance CLEAR CLEAR   Specific Gravity, Urine 1.020 1.005 - 1.030   pH 5.5 5.0 - 8.0   Glucose, UA NEGATIVE NEGATIVE mg/dL   Hgb urine dipstick NEGATIVE NEGATIVE   Bilirubin Urine NEGATIVE NEGATIVE   Ketones, ur NEGATIVE NEGATIVE mg/dL   Protein, ur NEGATIVE NEGATIVE mg/dL   Nitrite NEGATIVE NEGATIVE   Leukocytes, UA NEGATIVE NEGATIVE  Comprehensive metabolic panel     Status: Abnormal   Collection Time: 01/04/18  2:56 PM   Result Value Ref Range   Sodium 132 (L) 135 - 145 mmol/L   Potassium 3.7 3.5 - 5.1 mmol/L   Chloride 104 98 - 111 mmol/L   CO2 18 (L) 22 - 32 mmol/L   Glucose, Bld 79 70 - 99 mg/dL   BUN 8 6 - 20 mg/dL   Creatinine, Ser 1.61 0.44 - 1.00 mg/dL   Calcium 8.8 (L) 8.9 - 10.3 mg/dL   Total Protein 7.0 6.5 - 8.1 g/dL   Albumin 3.1 (L) 3.5 - 5.0 g/dL   AST 17 15 - 41 U/L   ALT 13 0 - 44 U/L   Alkaline Phosphatase 153 (H) 38 - 126 U/L   Total Bilirubin 0.7 0.3 - 1.2 mg/dL   GFR calc non Af Amer >60 >60 mL/min   GFR calc Af Amer >60 >60 mL/min   Anion gap 10 5 - 15    Imaging:  No results found.  MAU Course: Orders Placed This Encounter  Procedures  . Urinalysis, Routine w reflex microscopic  . Comprehensive metabolic panel  . Contact Isolation: Enteric  . Discharge patient   Meds ordered this encounter  Medications  . loperamide (IMODIUM) capsule 4 mg  . ondansetron (ZOFRAN) tablet 8 mg  . ondansetron (ZOFRAN ODT) 8 MG disintegrating tablet    Sig: Take 1 tablet (8 mg total) by mouth every 8 (eight) hours as needed for nausea or vomiting.    Dispense:  20 tablet    Refill:  2    Order Specific Question:   Supervising Provider    Answer:   Willodean Rosenthal [phone]    MDM: - Suspect viral Gastro. Pt not dehydrates. Drinking well throughout MAU visit. Nausea resolved w/ Zofran. One episode of diarrhea in MAU, but none after Imodium. Upper abd cramping decreased w/ Meds, but pt concerned that it is still present. Low suspicion for Cholcystitis or other emergent condition due to time of pain coinciding w/ diarrhea, benign exam, absence of fever or vomiting and known sick contact.  Reassured that she is not have significant uterine contractions. Explained that Sx will likely resolve w/ resolution of nausea, diarrhea. - Decreased fetal mvmt--resolved. Pt feeling nml fetal activity in MAU. FHR reactive.   Assessment: 1. Diarrhea of presumed infectious origin   2. Decreased  fetal movements in third trimester, single or unspecified fetus   3. Abdominal pain during pregnancy in third trimester     Plan: Discharge home in stable condition.  Pt instructed to reschedule her ROB appt for tomorrow so as not to expose the public.  Clear liquid diet x 24 hours.  dehydration precautions.  Labor precautions and fetal kick counts Rx Zofran. Imodium OTC.  Follow-up Information    [facility] Follow up.   Specialty:  Obstetrics and Gynecology Why:  Call to reschedule return OB appointment later this  week Contact information: [address], [address] [place] [id number] [phone]       [facility] Follow up.   Specialty:  Obstetrics and Gynecology Why:  as needed in pregnancy emergencies Contact information: [address] [id number] [place] [id number] [phone]          Allergies as of 01/04/2018      Reactions   Sulfa Antibiotics Hives, Rash      Medication List    TAKE these medications   multivitamin-prenatal 27-0.8 MG Tabs tablet Take 1 tablet by mouth daily at 12 noon.   ondansetron 8 MG disintegrating tablet Commonly known as:  ZOFRAN ODT Take 1 tablet (8 mg total) by mouth every 8 (eight) hours as needed for nausea or vomiting.       Katrinka BlazingSmith, [place], CNM 01/04/2018 5:04 PM

## 2018-01-05 ENCOUNTER — Encounter: Payer: BLUE CROSS/BLUE SHIELD | Admitting: Obstetrics & Gynecology

## 2018-01-06 ENCOUNTER — Ambulatory Visit (INDEPENDENT_AMBULATORY_CARE_PROVIDER_SITE_OTHER): Payer: BLUE CROSS/BLUE SHIELD | Admitting: Certified Nurse Midwife

## 2018-01-06 VITALS — BP 126/76 | HR 65 | Wt 184.0 lb

## 2018-01-06 DIAGNOSIS — Z3480 Encounter for supervision of other normal pregnancy, unspecified trimester: Secondary | ICD-10-CM

## 2018-01-06 DIAGNOSIS — Z3483 Encounter for supervision of other normal pregnancy, third trimester: Secondary | ICD-10-CM

## 2018-01-06 DIAGNOSIS — Z98891 History of uterine scar from previous surgery: Secondary | ICD-10-CM

## 2018-01-06 NOTE — Progress Notes (Signed)
Subjective:  Sandra Harding is a 22 y.o. G2P1001 at 5283w4d being seen today for ongoing prenatal care.  She is currently monitored for the following issues for this low-risk pregnancy and has Supervision of other normal pregnancy, antepartum; Previous cesarean section; History of migraine; Cystic fibrosis gene carrier; and Hereditary disease in family possibly affecting fetus, fetus 1 on their problem list.  Patient reports no complaints.  Contractions: Irritability. Vag. Bleeding: None.  Movement: Present. Denies leaking of fluid.   The following portions of the patient's history were reviewed and updated as appropriate: allergies, current medications, past family history, past medical history, past social history, past surgical history and problem list. Problem list updated.  Objective:   Vitals:   01/06/18 0925  BP: 126/76  Pulse: 65  Weight: 83.5 kg    Fetal Status: Fetal Heart Rate (bpm): 148 Fundal Height: 37 cm Movement: Present  Presentation: Vertex  General:  Alert, oriented and cooperative. Patient is in no acute distress.  Skin: Skin is warm and dry. No rash noted.   Cardiovascular: Normal heart rate noted  Respiratory: Normal respiratory effort, no problems with respiration noted  Abdomen: Soft, gravid, appropriate for gestational age. Pain/Pressure: Present     Pelvic: Vag. Bleeding: None Vag D/C Character: Thin   Cervical exam performed Dilation: Fingertip Effacement (%): 60 Station: -3  Extremities: Normal range of motion.  Edema: Trace  Mental Status: Normal mood and affect. Normal behavior. Normal judgment and thought content.   Urinalysis:      Assessment and Plan:  Pregnancy: G2P1001 at 7883w4d  1. Supervision of other normal pregnancy, antepartum - NST/AFI at 40 wks - IOL at 41 wks  2. Previous cesarean section - planning TOLAC, consent signed  Term labor symptoms and general obstetric precautions including but not limited to vaginal bleeding, contractions,  leaking of fluid and fetal movement were reviewed in detail with the patient. Please refer to After Visit Summary for other counseling recommendations.  Return in about 10 days (around 01/16/2018).   Donette LarryBhambri, Ayodele Sangalang, CNM

## 2018-01-16 ENCOUNTER — Ambulatory Visit (INDEPENDENT_AMBULATORY_CARE_PROVIDER_SITE_OTHER): Payer: BLUE CROSS/BLUE SHIELD | Admitting: Certified Nurse Midwife

## 2018-01-16 VITALS — BP 120/78 | Wt 187.0 lb

## 2018-01-16 DIAGNOSIS — Z3483 Encounter for supervision of other normal pregnancy, third trimester: Secondary | ICD-10-CM | POA: Diagnosis not present

## 2018-01-16 DIAGNOSIS — Z3480 Encounter for supervision of other normal pregnancy, unspecified trimester: Secondary | ICD-10-CM

## 2018-01-16 DIAGNOSIS — Z98891 History of uterine scar from previous surgery: Secondary | ICD-10-CM

## 2018-01-16 NOTE — Progress Notes (Signed)
Subjective:  Sandra Harding is a 22 y.o. G2P1001 at 3328w0d being seen today for ongoing prenatal care.  She is currently monitored for the following issues for this low-risk pregnancy and has Supervision of other normal pregnancy, antepartum; Previous cesarean section; History of migraine; Cystic fibrosis gene carrier; and Hereditary disease in family possibly affecting fetus, fetus 1 on their problem list.  Patient reports no complaints.  Contractions: Irritability. Vag. Bleeding: None.  Movement: Present. Denies leaking of fluid.   The following portions of the patient's history were reviewed and updated as appropriate: allergies, current medications, past family history, past medical history, past social history, past surgical history and problem list. Problem list updated.  Objective:   Vitals:   01/16/18 0942  BP: 120/78  Weight: 84.8 kg    Fetal Status: Fetal Heart Rate (bpm): 130   Movement: Present  Presentation: Vertex  General:  Alert, oriented and cooperative. Patient is in no acute distress.  Skin: Skin is warm and dry. No rash noted.   Cardiovascular: Normal heart rate noted  Respiratory: Normal respiratory effort, no problems with respiration noted  Abdomen: Soft, gravid, appropriate for gestational age. Pain/Pressure: Present     Pelvic: Vag. Bleeding: None Vag D/C Character: Thin   Cervical exam performed Dilation: 1 Effacement (%): 70 Station: -2  Extremities: Normal range of motion.  Edema: Trace  Mental Status: Normal mood and affect. Normal behavior. Normal judgment and thought content.   Urinalysis:      Assessment and Plan:  Pregnancy: G2P1001 at 3428w0d  1. Supervision of other normal pregnancy - NST reactive - AFI 13cm - swept membranes per pt request  2. Previous cesarean section - planning TOLAC - IOL scheduled 1/4  Term labor symptoms and general obstetric precautions including but not limited to vaginal bleeding, contractions, leaking of fluid and  fetal movement were reviewed in detail with the patient. Please refer to After Visit Summary for other counseling recommendations.  Return in about 1 week (around 01/23/2018).   Donette LarryBhambri, Cordaro Mukai, CNM

## 2018-01-16 NOTE — Progress Notes (Signed)
First BP 143/106. Repeat BP 120/78

## 2018-01-17 ENCOUNTER — Other Ambulatory Visit: Payer: Self-pay | Admitting: Advanced Practice Midwife

## 2018-01-18 ENCOUNTER — Inpatient Hospital Stay (HOSPITAL_COMMUNITY): Payer: BLUE CROSS/BLUE SHIELD | Admitting: Anesthesiology

## 2018-01-18 ENCOUNTER — Encounter (HOSPITAL_COMMUNITY): Payer: Self-pay | Admitting: *Deleted

## 2018-01-18 ENCOUNTER — Other Ambulatory Visit: Payer: Self-pay

## 2018-01-18 ENCOUNTER — Inpatient Hospital Stay (HOSPITAL_COMMUNITY)
Admission: AD | Admit: 2018-01-18 | Discharge: 2018-01-20 | DRG: 768 | Disposition: A | Discharge status: Home / Self Care | Payer: BLUE CROSS/BLUE SHIELD | Attending: Obstetrics and Gynecology | Admitting: Obstetrics and Gynecology

## 2018-01-18 DIAGNOSIS — O34219 Maternal care for unspecified type scar from previous cesarean delivery: Secondary | ICD-10-CM | POA: Diagnosis present

## 2018-01-18 DIAGNOSIS — Z3A40 40 weeks gestation of pregnancy: Secondary | ICD-10-CM

## 2018-01-18 DIAGNOSIS — O480 Post-term pregnancy: Secondary | ICD-10-CM | POA: Diagnosis present

## 2018-01-18 DIAGNOSIS — Z8759 Personal history of other complications of pregnancy, childbirth and the puerperium: Secondary | ICD-10-CM | POA: Diagnosis present

## 2018-01-18 DIAGNOSIS — Z87891 Personal history of nicotine dependence: Secondary | ICD-10-CM

## 2018-01-18 DIAGNOSIS — Z141 Cystic fibrosis carrier: Secondary | ICD-10-CM | POA: Diagnosis not present

## 2018-01-18 DIAGNOSIS — O134 Gestational [pregnancy-induced] hypertension without significant proteinuria, complicating childbirth: Secondary | ICD-10-CM | POA: Diagnosis present

## 2018-01-18 DIAGNOSIS — O139 Gestational [pregnancy-induced] hypertension without significant proteinuria, unspecified trimester: Secondary | ICD-10-CM | POA: Diagnosis present

## 2018-01-18 DIAGNOSIS — Z3490 Encounter for supervision of normal pregnancy, unspecified, unspecified trimester: Secondary | ICD-10-CM | POA: Diagnosis present

## 2018-01-18 DIAGNOSIS — R030 Elevated blood-pressure reading, without diagnosis of hypertension: Secondary | ICD-10-CM | POA: Diagnosis present

## 2018-01-18 DIAGNOSIS — O34211 Maternal care for low transverse scar from previous cesarean delivery: Secondary | ICD-10-CM | POA: Diagnosis not present

## 2018-01-18 HISTORY — DX: Headache, unspecified: R51.9

## 2018-01-18 HISTORY — DX: Headache: R51

## 2018-01-18 LAB — CBC
HCT: 33 % — ABNORMAL LOW (ref 36.0–46.0)
HCT: 36.8 % (ref 36.0–46.0)
Hemoglobin: 10.9 g/dL — ABNORMAL LOW (ref 12.0–15.0)
Hemoglobin: 12.5 g/dL (ref 12.0–15.0)
MCH: 30.6 pg (ref 26.0–34.0)
MCH: 31.5 pg (ref 26.0–34.0)
MCHC: 33 g/dL (ref 30.0–36.0)
MCHC: 34 g/dL (ref 30.0–36.0)
MCV: 92.7 fL (ref 80.0–100.0)
MCV: 92.7 fL (ref 80.0–100.0)
NRBC: 0 % (ref 0.0–0.2)
PLATELETS: 194 10*3/uL (ref 150–400)
Platelets: 187 10*3/uL (ref 150–400)
RBC: 3.56 MIL/uL — ABNORMAL LOW (ref 3.87–5.11)
RBC: 3.97 MIL/uL (ref 3.87–5.11)
RDW: 12.3 % (ref 11.5–15.5)
RDW: 12.4 % (ref 11.5–15.5)
WBC: 11.1 10*3/uL — ABNORMAL HIGH (ref 4.0–10.5)
WBC: 14.9 10*3/uL — ABNORMAL HIGH (ref 4.0–10.5)
nRBC: 0 % (ref 0.0–0.2)

## 2018-01-18 LAB — COMPREHENSIVE METABOLIC PANEL
ALT: 15 U/L (ref 0–44)
ANION GAP: 8 (ref 5–15)
AST: 27 U/L (ref 15–41)
Albumin: 2.9 g/dL — ABNORMAL LOW (ref 3.5–5.0)
Alkaline Phosphatase: 170 U/L — ABNORMAL HIGH (ref 38–126)
BUN: 8 mg/dL (ref 6–20)
CO2: 19 mmol/L — ABNORMAL LOW (ref 22–32)
Calcium: 8.6 mg/dL — ABNORMAL LOW (ref 8.9–10.3)
Chloride: 106 mmol/L (ref 98–111)
Creatinine, Ser: 0.59 mg/dL (ref 0.44–1.00)
GFR calc Af Amer: >60 mL/min (ref >60)
GFR calc non Af Amer: >60 mL/min (ref >60)
Glucose, Bld: 74 mg/dL (ref 70–99)
Potassium: 4.5 mmol/L (ref 3.5–5.1)
Sodium: 133 mmol/L — ABNORMAL LOW (ref 135–145)
Total Bilirubin: 1.3 mg/dL — ABNORMAL HIGH (ref 0.3–1.2)
Total Protein: 6.7 g/dL (ref 6.5–8.1)

## 2018-01-18 LAB — PROTEIN / CREATININE RATIO, URINE
CREATININE, URINE: 96 mg/dL
Protein Creatinine Ratio: 0.1 mg/mg{Cre} (ref 0.00–0.15)
Total Protein, Urine: 10 mg/dL

## 2018-01-18 LAB — TYPE AND SCREEN
ABO/RH(D): A POS
Antibody Screen: NEGATIVE

## 2018-01-18 LAB — ABO/RH: ABO/RH(D): A POS

## 2018-01-18 MED ORDER — OXYTOCIN 40 UNITS IN LACTATED RINGERS INFUSION - SIMPLE MED
2.5000 [IU]/h | INTRAVENOUS | Status: DC
  Filled 2018-01-18: qty 1000

## 2018-01-18 MED ORDER — OXYCODONE-ACETAMINOPHEN 5-325 MG PO TABS: 1.0000 | ORAL_TABLET | ORAL | Status: DC | PRN

## 2018-01-18 MED ORDER — PHENYLEPHRINE 40 MCG/ML (10ML) SYRINGE FOR IV PUSH (FOR BLOOD PRESSURE SUPPORT)
80.0000 ug | PREFILLED_SYRINGE | INTRAVENOUS | Status: DC | PRN
  Filled 2018-01-18: qty 10

## 2018-01-18 MED ORDER — DIPHENHYDRAMINE HCL 50 MG/ML IJ SOLN: 12.5000 mg | INTRAMUSCULAR | Status: DC | PRN

## 2018-01-18 MED ORDER — LACTATED RINGERS IV SOLN
INTRAVENOUS | Status: DC
  Administered 2018-01-18 – 2018-01-19 (×4): via INTRAVENOUS

## 2018-01-18 MED ORDER — OXYTOCIN 40 UNITS IN LACTATED RINGERS INFUSION - SIMPLE MED
1.0000 m[IU]/min | INTRAVENOUS | Status: DC
  Administered 2018-01-18: 2 m[IU]/min via INTRAVENOUS

## 2018-01-18 MED ORDER — LACTATED RINGERS IV SOLN
500.0000 mL | Freq: Once | INTRAVENOUS | Status: AC
  Administered 2018-01-18: 1000 mL via INTRAVENOUS

## 2018-01-18 MED ORDER — TERBUTALINE SULFATE 1 MG/ML IJ SOLN
0.2500 mg | Freq: Once | INTRAMUSCULAR | Status: DC | PRN
  Filled 2018-01-18: qty 1

## 2018-01-18 MED ORDER — OXYTOCIN BOLUS FROM INFUSION
500.0000 mL | Freq: Once | INTRAVENOUS | Status: AC
  Administered 2018-01-19: 500 mL via INTRAVENOUS

## 2018-01-18 MED ORDER — FLEET ENEMA 7-19 GM/118ML RE ENEM: 1.0000 | ENEMA | RECTAL | Status: DC | PRN

## 2018-01-18 MED ORDER — ACETAMINOPHEN 325 MG PO TABS: 650.0000 mg | ORAL_TABLET | ORAL | Status: DC | PRN

## 2018-01-18 MED ORDER — FENTANYL CITRATE (PF) 100 MCG/2ML IJ SOLN
INTRAMUSCULAR | Status: AC
  Filled 2018-01-18: qty 2

## 2018-01-18 MED ORDER — LACTATED RINGERS IV SOLN
500.0000 mL | INTRAVENOUS | Status: DC | PRN
  Administered 2018-01-18: 500 mL via INTRAVENOUS

## 2018-01-18 MED ORDER — PHENYLEPHRINE 40 MCG/ML (10ML) SYRINGE FOR IV PUSH (FOR BLOOD PRESSURE SUPPORT)
80.0000 ug | PREFILLED_SYRINGE | INTRAVENOUS | Status: DC | PRN
  Filled 2018-01-18 (×2): qty 10

## 2018-01-18 MED ORDER — EPHEDRINE 5 MG/ML INJ
10.0000 mg | INTRAVENOUS | Status: DC | PRN
  Filled 2018-01-18: qty 2

## 2018-01-18 MED ORDER — OXYCODONE-ACETAMINOPHEN 5-325 MG PO TABS: 2.0000 | ORAL_TABLET | ORAL | Status: DC | PRN

## 2018-01-18 MED ORDER — LIDOCAINE HCL (PF) 1 % IJ SOLN
INTRAMUSCULAR | Status: DC | PRN
  Administered 2018-01-18 (×2): 5 mL via EPIDURAL

## 2018-01-18 MED ORDER — FENTANYL 2.5 MCG/ML BUPIVACAINE 1/10 % EPIDURAL INFUSION (WH - ANES)
14.0000 mL/h | INTRAMUSCULAR | Status: DC | PRN
  Administered 2018-01-18: 14 mL/h via EPIDURAL
  Filled 2018-01-18: qty 100

## 2018-01-18 MED ORDER — FENTANYL CITRATE (PF) 100 MCG/2ML IJ SOLN
100.0000 ug | INTRAMUSCULAR | Status: DC | PRN
  Administered 2018-01-18 (×2): 100 ug via INTRAVENOUS
  Filled 2018-01-18: qty 2

## 2018-01-18 MED ORDER — LIDOCAINE HCL (PF) 1 % IJ SOLN
30.0000 mL | INTRAMUSCULAR | Status: DC | PRN
  Administered 2018-01-19: 30 mL via SUBCUTANEOUS
  Filled 2018-01-18: qty 30

## 2018-01-18 MED ORDER — SOD CITRATE-CITRIC ACID 500-334 MG/5ML PO SOLN: 30.0000 mL | ORAL | Status: DC | PRN

## 2018-01-18 MED ORDER — ONDANSETRON HCL 4 MG/2ML IJ SOLN: 4.0000 mg | Freq: Four times a day (QID) | INTRAMUSCULAR | Status: DC | PRN

## 2018-01-18 NOTE — MAU Note (Addendum)
Reports ctx started yesterday, now they are more intense and are 4-8 min  No LOF, no bleeding, + FM  Reports nausea and a headache  Had some blurry vision earlier but none now

## 2018-01-18 NOTE — H&P (Addendum)
LABOR AND DELIVERY ADMISSION HISTORY AND PHYSICAL NOTE  Sandra Harding is a 22 y.o. female G2P1001 with IUP at 6516w2d by LMP presenting for IOL. Patient was seen in MAU for contractions/labor check, and was found to have elevated BP while in MAU. She was admitted for IOL for elevated BP. She reports positive fetal movement. She denies leakage of fluid or vaginal bleeding.  Prenatal History/Complications: [facility] Pregnancy complications:  - history of Cesarean section for fetal bradycardia, TOLAC - cystic fibrosis carrier - migraine  Past Medical History: Past Medical History:  Diagnosis Date  . Headache   . Medical history non-contributory     Past Surgical History: Past Surgical History:  Procedure Laterality Date  . CESAREAN SECTION N/A 09/27/2013   Procedure: CESAREAN SECTION;  Surgeon: Lesly DukesKelly H Leggett, MD;  Location: [facility];  Service: Obstetrics;  Laterality: N/A;    Obstetrical History: OB History    Gravida  2   Para  1   Term  1   Preterm      AB      Living  1     SAB      TAB      Ectopic      Multiple  0   Live Births  1           Social History: Social History   Socioeconomic History  . Marital status: Single    Spouse name: Not on file  . Number of children: 1  . Years of education: Not on file  . Highest education level: Not on file  Occupational History  . Not on file  Social Needs  . Financial resource strain: Not on file  . Food insecurity:    Worry: Not on file    Inability: Not on file  . Transportation needs:    Medical: Not on file    Non-medical: Not on file  Tobacco Use  . Smoking status: Former Games developermoker  . Smokeless tobacco: Never Used  Substance and Sexual Activity  . Alcohol use: No  . Drug use: Not Currently    Types: Marijuana  . Sexual activity: Yes  Lifestyle  . Physical activity:    Days per week: Not on file    Minutes per session: Not on file  . Stress: Not on file  Relationships  . Social  connections:    Talks on phone: Not on file    Gets together: Not on file    Attends religious service: Not on file    Active member of club or organization: Not on file    Attends meetings of clubs or organizations: Not on file    Relationship status: Not on file  Other Topics Concern  . Not on file  Social History Narrative  . Not on file    Family History: Family History  Problem Relation Age of Onset  . Cancer Maternal Grandfather   . Diabetes Paternal Aunt   . Heart attack Maternal Grandmother   . Alzheimer's disease Paternal Grandfather   . Cancer Maternal Uncle     Allergies: Allergies  Allergen Reactions  . Sulfa Antibiotics Hives and Rash    Medications Prior to Admission  Medication Sig Dispense Refill Last Dose  . ondansetron (ZOFRAN ODT) 8 MG disintegrating tablet Take 1 tablet (8 mg total) by mouth every 8 (eight) hours as needed for nausea or vomiting. 20 tablet 2 Taking  . Prenatal Vit-Fe Fumarate-FA (MULTIVITAMIN-PRENATAL) 27-0.8 MG TABS tablet Take 1  tablet by mouth daily at 12 noon.   Taking     Review of Systems  All systems reviewed and negative except as stated in HPI  Physical Exam Blood pressure 137/84, pulse 65, temperature 98.2 F (36.8 C), temperature source Oral, resp. rate 18, height 5' 2.5" (1.588 m), weight 85.4 kg, last menstrual period 04/11/2017, unknown if currently breastfeeding. General appearance: alert, oriented, NAD, bouncing on birthing ball Lungs: normal respiratory effort Heart: regular rate Abdomen: soft, non-tender; gravid, FH appropriate for GA Extremities: No calf swelling or tenderness Presentation: cephalic Fetal monitoring: baseline HR 120, moderate variability, +accel, -decel Uterine activity: irregular Dilation: 3 Effacement (%): 80 Station: -1 Exam by:: Exie ParodyA. Gagliardo, RN  Prenatal labs: ABO, Rh: --/--/A POS (12/29 1055) Antibody: NEG (12/29 1055) Rubella: 3.00 (06/07 1130) RPR: NON-REACTIVE (09/30 0926)   HBsAg: NON-REACTIVE (06/07 1130)  HIV: NON-REACTIVE (09/30 0926)  GC/Chlamydia: neg/neg GBS:   Negative 2-hr GTT: 82 Genetic screening:  Low risk NIPS; positive CF carrier, FOB not available for testing Anatomy US: normal female  Prenatal Transfer Tool  Maternal Diabetes: No Genetic Screening: Abnormal:  Results: Other:CF carrier Maternal Ultrasounds/Referrals: Normal Fetal Ultrasounds or other Referrals:  None Maternal Substance Abuse:  No Significant Maternal Medications:  None Significant Maternal Lab Results: None  Results for orders placed or performed during the hospital encounter of 01/18/18 (from the past 24 hour(s))  Protein / creatinine ratio, urine   Collection Time: 01/18/18 10:12 AM  Result Value Ref Range   Creatinine, Urine 96.00 mg/dL   Total Protein, Urine 10 mg/dL   Protein Creatinine Ratio 0.10 0.00 - 0.15 mg/mg[Cre]  CBC   Collection Time: 01/18/18 10:55 AM  Result Value Ref Range   WBC 11.1 (H) 4.0 - 10.5 K/uL   RBC 3.56 (L) 3.87 - 5.11 MIL/uL   Hemoglobin 10.9 (L) 12.0 - 15.0 g/dL   HCT 56.233.0 (L) 13.036.0 - 86.546.0 %   MCV 92.7 80.0 - 100.0 fL   MCH 30.6 26.0 - 34.0 pg   MCHC 33.0 30.0 - 36.0 g/dL   RDW 78.412.3 69.611.5 - 29.515.5 %   Platelets 194 150 - 400 K/uL   nRBC 0.0 0.0 - 0.2 %  Comprehensive metabolic panel   Collection Time: 01/18/18 10:55 AM  Result Value Ref Range   Sodium 133 (L) 135 - 145 mmol/L   Potassium 4.5 3.5 - 5.1 mmol/L   Chloride 106 98 - 111 mmol/L   CO2 19 (L) 22 - 32 mmol/L   Glucose, Bld 74 70 - 99 mg/dL   BUN 8 6 - 20 mg/dL   Creatinine, Ser 2.840.59 0.44 - 1.00 mg/dL   Calcium 8.6 (L) 8.9 - 10.3 mg/dL   Total Protein 6.7 6.5 - 8.1 g/dL   Albumin 2.9 (L) 3.5 - 5.0 g/dL   AST 27 15 - 41 U/L   ALT 15 0 - 44 U/L   Alkaline Phosphatase 170 (H) 38 - 126 U/L   Total Bilirubin 1.3 (H) 0.3 - 1.2 mg/dL   GFR calc non Af Amer >60 >60 mL/min   GFR calc Af Amer >60 >60 mL/min   Anion gap 8 5 - 15  Type and screen [facility]    Collection Time: 01/18/18 10:55 AM  Result Value Ref Range   ABO/RH(D) A POS    Antibody Screen NEG    Sample Expiration      01/21/2018 Performed at [facility], [address] [place], [place] [address]  Patient Active Problem List   Diagnosis Date Noted  . Indication for care in labor and delivery, antepartum 01/18/2018  . Hereditary disease in family possibly affecting fetus, fetus 1   . 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 Supervision of other normal pregnancy, antepartum 06/27/2017  . Previous cesarean section 06/27/2017  . History of migraine 06/27/2017    Assessment: Sandra Harding is a 22 y.o. G2P1001 at [redacted]w[redacted]d here for IOL for elevated BP.   #Labor: cervical progression since cervical check at last clinic visit. Will recheck in 1 hour, and augment if needed #Pain: wants to try unmedicated, open to IV pain medication, does not want epidural #FWB: Reactive  #ID: GBS neg #MOF: breast #MOC: Mirena #Circ: yes, inpatient  Justine Null, MD Family Medicine PGY3 01/18/2018, 1:01 PM   OB FELLOW HISTORY AND PHYSICAL ATTESTATION  I have seen and examined this patient; I agree with above documentation in the resident's note.   Marcy Siren, D.O. OB Fellow  01/18/2018, 1:48 PM

## 2018-01-18 NOTE — Anesthesia Preprocedure Evaluation (Signed)
Anesthesia Evaluation  Patient identified by MRN, date of birth, ID band Patient awake    Reviewed: Allergy & Precautions, H&P , NPO status , Patient's Chart, lab work & pertinent test results  History of Anesthesia Complications Negative for: history of anesthetic complications  Airway Mallampati: II  TM Distance: >3 FB Neck ROM: full    Dental no notable dental hx. (+) Teeth Intact   Pulmonary neg pulmonary ROS, former smoker,    Pulmonary exam normal breath sounds clear to auscultation       Cardiovascular negative cardio ROS Normal cardiovascular exam Rhythm:regular Rate:Normal     Neuro/Psych negative neurological ROS  negative psych ROS   GI/Hepatic negative GI ROS, Neg liver ROS,   Endo/Other  negative endocrine ROS  Renal/GU negative Renal ROS  negative genitourinary   Musculoskeletal   Abdominal   Peds  Hematology negative hematology ROS (+)   Anesthesia Other Findings   Reproductive/Obstetrics (+) Pregnancy                             Anesthesia Physical Anesthesia Plan  ASA: II  Anesthesia Plan: Epidural   Post-op Pain Management:    Induction:   PONV Risk Score and Plan:   Airway Management Planned:   Additional Equipment:   Intra-op Plan:   Post-operative Plan:   Informed Consent: I have reviewed the patients History and Physical, chart, labs and discussed the procedure including the risks, benefits and alternatives for the proposed anesthesia with the patient or authorized representative who has indicated his/her understanding and acceptance.       Plan Discussed with:   Anesthesia Plan Comments:         Anesthesia Quick Evaluation  

## 2018-01-18 NOTE — Anesthesia Procedure Notes (Signed)
Epidural Patient location during procedure: OB  Staffing Anesthesiologist: Yaneisy Wenz, MD Performed: anesthesiologist   Preanesthetic Checklist Completed: patient identified, site marked, surgical consent, pre-op evaluation, timeout performed, IV checked, risks and benefits discussed and monitors and equipment checked  Epidural Patient position: sitting Prep: DuraPrep Patient monitoring: heart rate, continuous pulse ox and blood pressure Approach: right paramedian Location: L3-L4 Injection technique: LOR saline  Needle:  Needle type: Tuohy  Needle gauge: 17 G Needle length: 9 cm and 9 Needle insertion depth: 6 cm Catheter type: closed end flexible Catheter size: 20 Guage Catheter at skin depth: 10 cm Test dose: negative  Assessment Events: blood not aspirated, injection not painful, no injection resistance, negative IV test and no paresthesia  Additional Notes Patient identified. Risks/Benefits/Options discussed with patient including but not limited to bleeding, infection, nerve damage, paralysis, failed block, incomplete pain control, headache, blood pressure changes, nausea, vomiting, reactions to medication both or allergic, itching and postpartum back pain. Confirmed with bedside nurse the patient's most recent platelet count. Confirmed with patient that they are not currently taking any anticoagulation, have any bleeding history or any family history of bleeding disorders. Patient expressed understanding and wished to proceed. All questions were answered. Sterile technique was used throughout the entire procedure. Please see nursing notes for vital signs. Test dose was given through epidural needle and negative prior to continuing to dose epidural or start infusion. Warning signs of high block given to the patient including shortness of breath, tingling/numbness in hands, complete motor block, or any concerning symptoms with instructions to call for help. Patient was given  instructions on fall risk and not to get out of bed. All questions and concerns addressed with instructions to call with any issues.     

## 2018-01-18 NOTE — Progress Notes (Signed)
LABOR PROGRESS NOTE  Sandra Harding is a 22 y.o. G2P1001 at 2339w2d  admitted for IOL for gestational HTN.  Subjective: Doing well, feeling contractions more painfully but tolerating well.   Objective: BP 135/87   Pulse 66   Temp 98.2 F (36.8 C) (Oral)   Resp 18   Ht 5' 2.5" (1.588 m)   Wt 85.4 kg   LMP 04/11/2017 (Exact Date)   BMI 33.89 kg/m  or  Vitals:   01/18/18 1017 01/18/18 1125 01/18/18 1240 01/18/18 1425  BP: (!) 150/98 (!) 151/88 137/84 135/87  Pulse: 63 64 65 66  Resp:  16 18 18   Temp:   98.2 F (36.8 C)   TempSrc:   Oral   Weight:  85.4 kg    Height:  5' 2.5" (1.588 m)      Dilation: 3 Effacement (%): 80 Cervical Position: Posterior Station: -1 Presentation: Vertex Exam by:: Dr. Andrey CampanileWilson FHT: baseline rate 130, moderate varibility, +acel, -decel Toco: irregular  Labs: Lab Results  Component Value Date   WBC 11.1 (H) 01/18/2018   HGB 10.9 (L) 01/18/2018   HCT 33.0 (L) 01/18/2018   MCV 92.7 01/18/2018   PLT 194 01/18/2018    Patient Active Problem List   Diagnosis Date Noted  . Indication for care in labor and delivery, antepartum 01/18/2018  . Hereditary disease in family possibly affecting fetus, fetus 1   . 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 Supervision of other normal pregnancy, antepartum 06/27/2017  . Previous cesarean section 06/27/2017  . History of migraine 06/27/2017    Assessment / Plan: 22 y.o. G2P1001 at 6839w2d here for IOL for gHTN.  Labor: no cervical change since last exam. TOLAC, will start pitocin Fetal Wellbeing:  Cat I Pain Control:  None, open to IV pain medication Anticipated MOD:  SVD  Sandra NullBridgid Anthonee Gelin, MD Family Medicine PGY3 01/18/2018, 2:33 PM

## 2018-01-18 NOTE — Progress Notes (Signed)
LABOR PROGRESS NOTE  Sandra Harding is a 22 y.o. G2P1001 at 6072w2d  admitted for IOL for gHTN  Subjective: Contractions more painful since starting pitocin, requesting pain medicine  Objective: BP 137/87   Pulse 65   Temp 98 F (36.7 C) (Oral)   Resp 16   Ht 5' 2.5" (1.588 m)   Wt 85.4 kg   LMP 04/11/2017 (Exact Date)   BMI 33.89 kg/m  or  Vitals:   01/18/18 1630 01/18/18 1700 01/18/18 1730 01/18/18 1800  BP: (!) 132/44 (!) 146/83 131/80 137/87  Pulse: 64 60 67 65  Resp: 16 16 16 16   Temp:      TempSrc:      Weight:      Height:        Dilation: 4 Effacement (%): 80 Cervical Position: Posterior Station: -3 Presentation: Vertex Exam by:: Dr Sandra Harding FHT: baseline rate 130, moderate varibility, +acel, -decel Toco: every 3 min  Labs: Lab Results  Component Value Date   WBC 11.1 (H) 01/18/2018   HGB 10.9 (L) 01/18/2018   HCT 33.0 (L) 01/18/2018   MCV 92.7 01/18/2018   PLT 194 01/18/2018    Patient Active Problem List   Diagnosis Date Noted  . Indication for care in labor and delivery, antepartum 01/18/2018  . Hereditary disease in family possibly affecting fetus, fetus 1   . 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 Supervision of other normal pregnancy, antepartum 06/27/2017  . Previous cesarean section 06/27/2017  . History of migraine 06/27/2017    Assessment / Plan: 22 y.o. G2P1001 at 3172w2d here for IOL for gHTN  Labor: minimal cervical change since starting pitocin. Continue pitocin titration Fetal Wellbeing:  Cat I Pain Control:  IV pain medication Anticipated MOD:  SVD  Sandra NullBridgid Teaira Croft, MD Family Medicine PGY3 01/18/2018, 7:00 PM

## 2018-01-19 ENCOUNTER — Encounter (HOSPITAL_COMMUNITY): Payer: Self-pay

## 2018-01-19 ENCOUNTER — Telehealth: Payer: Self-pay | Admitting: *Deleted

## 2018-01-19 DIAGNOSIS — O139 Gestational [pregnancy-induced] hypertension without significant proteinuria, unspecified trimester: Secondary | ICD-10-CM | POA: Diagnosis present

## 2018-01-19 DIAGNOSIS — Z3490 Encounter for supervision of normal pregnancy, unspecified, unspecified trimester: Secondary | ICD-10-CM | POA: Diagnosis present

## 2018-01-19 DIAGNOSIS — O480 Post-term pregnancy: Secondary | ICD-10-CM

## 2018-01-19 DIAGNOSIS — O34211 Maternal care for low transverse scar from previous cesarean delivery: Secondary | ICD-10-CM

## 2018-01-19 DIAGNOSIS — Z3A40 40 weeks gestation of pregnancy: Secondary | ICD-10-CM

## 2018-01-19 DIAGNOSIS — Z8759 Personal history of other complications of pregnancy, childbirth and the puerperium: Secondary | ICD-10-CM | POA: Diagnosis present

## 2018-01-19 HISTORY — DX: Gestational (pregnancy-induced) hypertension without significant proteinuria, unspecified trimester: O13.9

## 2018-01-19 LAB — CBC
HCT: 32.8 % — ABNORMAL LOW (ref 36.0–46.0)
Hemoglobin: 11 g/dL — ABNORMAL LOW (ref 12.0–15.0)
MCH: 30.9 pg (ref 26.0–34.0)
MCHC: 33.5 g/dL (ref 30.0–36.0)
MCV: 92.1 fL (ref 80.0–100.0)
PLATELETS: 167 10*3/uL (ref 150–400)
RBC: 3.56 MIL/uL — ABNORMAL LOW (ref 3.87–5.11)
RDW: 12.2 % (ref 11.5–15.5)
WBC: 15.9 10*3/uL — ABNORMAL HIGH (ref 4.0–10.5)
nRBC: 0 % (ref 0.0–0.2)

## 2018-01-19 LAB — RPR: RPR Ser Ql: NONREACTIVE

## 2018-01-19 MED ORDER — MEASLES, MUMPS & RUBELLA VAC IJ SOLR
0.5000 mL | Freq: Once | INTRAMUSCULAR | Status: DC
  Filled 2018-01-19: qty 0.5

## 2018-01-19 MED ORDER — BENZOCAINE-MENTHOL 20-0.5 % EX AERO
1.0000 "application " | INHALATION_SPRAY | CUTANEOUS | Status: DC | PRN
  Administered 2018-01-19 – 2018-01-20 (×2): 1 via TOPICAL
  Filled 2018-01-19 (×2): qty 56

## 2018-01-19 MED ORDER — DIBUCAINE 1 % RE OINT: 1.0000 "application " | TOPICAL_OINTMENT | RECTAL | Status: DC | PRN

## 2018-01-19 MED ORDER — POLYETHYLENE GLYCOL 3350 17 G PO PACK
17.0000 g | PACK | Freq: Every day | ORAL | Status: DC
  Administered 2018-01-20: 17 g via ORAL
  Filled 2018-01-19 (×3): qty 1

## 2018-01-19 MED ORDER — PRENATAL MULTIVITAMIN CH
1.0000 | ORAL_TABLET | Freq: Every day | ORAL | Status: DC
  Administered 2018-01-19 – 2018-01-20 (×2): 1 via ORAL
  Filled 2018-01-19 (×2): qty 1

## 2018-01-19 MED ORDER — ACETAMINOPHEN 325 MG PO TABS: 650.0000 mg | ORAL_TABLET | ORAL | Status: DC | PRN

## 2018-01-19 MED ORDER — ONDANSETRON HCL 4 MG/2ML IJ SOLN: 4.0000 mg | INTRAMUSCULAR | Status: DC | PRN

## 2018-01-19 MED ORDER — SIMETHICONE 80 MG PO CHEW: 80.0000 mg | CHEWABLE_TABLET | ORAL | Status: DC | PRN

## 2018-01-19 MED ORDER — COCONUT OIL OIL
1.0000 "application " | TOPICAL_OIL | Status: DC | PRN
  Administered 2018-01-19: 1 via TOPICAL
  Filled 2018-01-19: qty 120

## 2018-01-19 MED ORDER — SENNOSIDES-DOCUSATE SODIUM 8.6-50 MG PO TABS
2.0000 | ORAL_TABLET | ORAL | Status: DC
  Administered 2018-01-19: 1 via ORAL
  Administered 2018-01-20: 2 via ORAL
  Filled 2018-01-19 (×2): qty 2

## 2018-01-19 MED ORDER — TETANUS-DIPHTH-ACELL PERTUSSIS 5-2.5-18.5 LF-MCG/0.5 IM SUSP: 0.5000 mL | Freq: Once | INTRAMUSCULAR | Status: DC

## 2018-01-19 MED ORDER — DIPHENHYDRAMINE HCL 25 MG PO CAPS: 25.0000 mg | ORAL_CAPSULE | Freq: Four times a day (QID) | ORAL | Status: DC | PRN

## 2018-01-19 MED ORDER — CEFAZOLIN SODIUM-DEXTROSE 2-4 GM/100ML-% IV SOLN
2.0000 g | Freq: Once | INTRAVENOUS | Status: AC
  Administered 2018-01-19: 2 g via INTRAVENOUS
  Filled 2018-01-19: qty 100

## 2018-01-19 MED ORDER — IBUPROFEN 600 MG PO TABS
600.0000 mg | ORAL_TABLET | Freq: Four times a day (QID) | ORAL | Status: DC
  Administered 2018-01-19 – 2018-01-20 (×6): 600 mg via ORAL
  Filled 2018-01-19 (×6): qty 1

## 2018-01-19 MED ORDER — WITCH HAZEL-GLYCERIN EX PADS: 1.0000 "application " | MEDICATED_PAD | CUTANEOUS | Status: DC | PRN

## 2018-01-19 MED ORDER — DOCUSATE SODIUM 100 MG PO CAPS
100.0000 mg | ORAL_CAPSULE | Freq: Two times a day (BID) | ORAL | Status: DC
  Administered 2018-01-19 – 2018-01-20 (×2): 100 mg via ORAL
  Filled 2018-01-19 (×3): qty 1

## 2018-01-19 MED ORDER — ONDANSETRON HCL 4 MG PO TABS: 4.0000 mg | ORAL_TABLET | ORAL | Status: DC | PRN

## 2018-01-19 NOTE — Telephone Encounter (Signed)
Left patient a message to call and schedule BP and incision check in 1 week and Postpartum in 4 weeks.

## 2018-01-19 NOTE — Progress Notes (Signed)
LABOR PROGRESS NOTE  Sandra Harding is a 22 y.o. G2P1001 at 3266w3d admitted for IOL for gHTN  Subjective: Feeling constant rectal pressure  Objective: BP (!) 153/76   Pulse 76   Temp 97.9 F (36.6 C) (Oral)   Resp 16   Ht 5' 2.5" (1.588 m)   Wt 85.4 kg   LMP 04/11/2017 (Exact Date)   SpO2 98%   BMI 33.89 kg/m  or  Vitals:   01/18/18 2331 01/18/18 2335 01/18/18 2337 01/18/18 2346  BP: (!) 167/101 (!) 153/76 (!) 153/76   Pulse: 75 76    Resp:      Temp:    97.9 F (36.6 C)  TempSrc:    Oral  SpO2:      Weight:      Height:        Dilation: 9 Effacement (%): 80 Cervical Position: Posterior Station: 0 Presentation: Vertex Exam by:: Ileana LaddLexie Ament, RN FHT: baseline rate 140s, moderate varibility, + acel, no decel Toco: regular q3-657m  Labs: Lab Results  Component Value Date   WBC 14.9 (H) 01/18/2018   HGB 12.5 01/18/2018   HCT 36.8 01/18/2018   MCV 92.7 01/18/2018   PLT 187 01/18/2018    Patient Active Problem List   Diagnosis Date Noted  . Indication for care in labor and delivery, antepartum 01/18/2018  . Hereditary disease in family possibly affecting fetus, fetus 1   . 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 Supervision of other normal pregnancy, antepartum 06/27/2017  . Previous cesarean section 06/27/2017  . History of migraine 06/27/2017    Assessment / Plan: 22 y.o. G2P1001 at 5666w3d here for IOL for gHTN  Labor: will start pushing when complete given baby low  Fetal Wellbeing:  Cat 1 Pain Control:  epidural Anticipated MOD:  SVD  Sandra AbbotNimeka Marlowe Lawes, MD  OB Fellow  01/19/2018, 12:05 AM

## 2018-01-19 NOTE — Telephone Encounter (Signed)
-----   Message from Gwenevere AbbotNimeka Phillip, MD sent at 01/19/2018  3:36 AM EST ----- Regarding: PP f/u Please schedule this patient for Postpartum visit in: 4 weeks with the following provider: Any provider For C/S patients schedule nurse incision check in weeks 2 weeks: no High risk pregnancy complicated by: HTN Delivery mode:  VBAC Anticipated Birth Control:  IUD PP Procedures needed: BP check and 1 week laceration check by MD since 3rd degree Schedule Integrated BH visit: no

## 2018-01-19 NOTE — Discharge Summary (Addendum)
Postpartum Discharge Summary     Patient Name: Sandra Harding DOB: 11/21/1995 MRN: [id number]  Date of admission: 01/18/2018 Delivering Provider: Gwenevere AbbotPHILLIP, NIMEKA   Date of discharge: 01/20/2018  Admitting diagnosis: 40 WKS 2 DAYS CTX PLUG FELL OUT LAST NIGHT 4 TO 8 MINS APART Intrauterine pregnancy: 6224w3d     Secondary diagnosis:  Principal Problem:   Encounter for induction of labor Active Problems:   Indication for care in labor and delivery, antepartum   Laceration, obstetrical, third degree   Gestational hypertension  Additional problems: none     Discharge diagnosis: VBAC                                                                                                Post partum procedures:none  Augmentation: AROM and Pitocin  Complications: None  Hospital course:  Induction of Labor With Vaginal Delivery   22 y.o. yo [id number] at 5624w3d was admitted to the hospital 01/18/2018 for induction of labor.  Indication for induction: Postdates.  Patient had an uncomplicated labor course as follows: Membrane Rupture Time/Date: 12:15 AM ,01/19/2018   Intrapartum Procedures: Episiotomy: None [1]                                         Lacerations:  3rd degree [4]  Patient had delivery of a Viable infant.  Information for the patient's newborn:  Tillman SersBuckner, Boy Timisha [[id number]][[id number]]  Delivery Method: VBAC, Spontaneous(Filed from Delivery Summary)   01/19/2018  Details of delivery can be found in separate delivery note.  Patient had a routine postpartum course. Patient is discharged home 01/20/18.  Magnesium Sulfate recieved: No BMZ received: No  Physical exam  Vitals:   01/19/18 0509 01/19/18 0915 01/19/18 1706 01/19/18 2106  BP: 135/77 129/81 133/74 115/61  Pulse: 81 64 66 76  Resp: 16 18 18 18   Temp: 97.6 F (36.4 C) 97.9 F (36.6 C) 97.8 F (36.6 C) 98.6 F (37 C)  TempSrc: Oral Oral Oral Oral  SpO2: 99%  99% 96%  Weight:      Height:       General: alert,  cooperative and no distress Lochia: appropriate Uterine Fundus: firm Incision: N/A DVT Evaluation: No evidence of DVT seen on physical exam. Negative Homan's sign. No cords or calf tenderness. No significant calf/ankle edema. Labs: Lab Results  Component Value Date   WBC 15.9 (H) 01/19/2018   HGB 11.0 (L) 01/19/2018   HCT 32.8 (L) 01/19/2018   MCV 92.1 01/19/2018   PLT 167 01/19/2018   CMP Latest Ref Rng & Units 01/18/2018  Glucose 70 - 99 mg/dL 74  BUN 6 - 20 mg/dL 8  Creatinine 6.960.44 - 2.951.00 mg/dL 2.840.59  Sodium 132135 - 440145 mmol/L 133(L)  Potassium 3.5 - 5.1 mmol/L 4.5  Chloride 98 - 111 mmol/L 106  CO2 22 - 32 mmol/L 19(L)  Calcium 8.9 - 10.3 mg/dL 1.0(U8.6(L)  Total Protein 6.5 - 8.1 g/dL 6.7  Total Bilirubin 0.3 - 1.2 mg/dL 7.2(Z1.3(H)  Alkaline Phos 38 - 126 U/L 170(H)  AST 15 - 41 U/L 27  ALT 0 - 44 U/L 15    Discharge instruction: per After Visit Summary and "Baby and Me Booklet".  After visit meds:  Allergies as of 01/20/2018      Reactions   Sulfa Antibiotics Hives, Rash      Medication List    STOP taking these medications   ondansetron 8 MG disintegrating tablet Commonly known as:  ZOFRAN ODT     TAKE these medications   ibuprofen 600 MG tablet Commonly known as:  ADVIL,MOTRIN Take 1 tablet (600 mg total) by mouth every 6 (six) hours.       Diet: routine diet  Activity: Advance as tolerated. Pelvic rest for 6 weeks.   Outpatient follow up:1 week Follow up Appt: No future appointments. Follow up Visit: Follow-up Information    [facility] [place]. Schedule an appointment as soon as possible for a visit in 4 week(s).   Specialty:  Obstetrics and Gynecology Contact information: [address], [address] [place] [phone]         Please schedule this patient for Postpartum visit in: 4 weeks with the following provider: Any provider For C/S patients schedule nurse incision check in weeks 2  weeks: no High risk pregnancy complicated by: HTN Delivery mode:  VBAC Anticipated Birth Control:  IUD PP Procedures needed: BP check and 1 week laceration check by MD since 3rd degree Schedule Integrated BH visit: no  Newborn Data: Live born female  Birth Weight: 7 lb 8.8 oz (3425 g) APGAR: 9, 9  Newborn Delivery   Birth date/time:  01/19/2018 01:41:00 Delivery type:  Vaginal, Spontaneous     Baby Feeding: Breast Disposition:home with mother   01/20/2018 Levie HeritageJacob J Stinson, DO

## 2018-01-19 NOTE — Anesthesia Postprocedure Evaluation (Signed)
Anesthesia Post Note  Patient: Sandra Harding  Procedure(s) Performed: AN AD HOC LABOR EPIDURAL     Patient location during evaluation: Mother Baby Anesthesia Type: Epidural Level of consciousness: awake Pain management: satisfactory to patient Vital Signs Assessment: post-procedure vital signs reviewed and stable Respiratory status: spontaneous breathing Cardiovascular status: stable Anesthetic complications: no    Last Vitals:  Vitals:   01/19/18 0420 01/19/18 0509  BP: 134/79 135/77  Pulse: 79 81  Resp: 18 16  Temp: 37.1 C 36.4 C  SpO2: 99% 99%    Last Pain:  Vitals:   01/19/18 0715  TempSrc:   PainSc: 0-No pain   Pain Goal:                 KeyCorpBURGER,[name]

## 2018-01-19 NOTE — Lactation Note (Signed)
This note was copied from a baby's chart. Lactation Consultation Note:  Mother is a P2 with a 464 yr old at home whom she breastfed for 5 weeks.  Infant is 4612 hours old and has been breastfeeding without difficulty.  Observed mother with infant in cradle hold . Mother pulling breast away from from infants nose with fingers at the edge of the nipple. Observed nipple pinched when infant released the breast.   mother taught importance of  deeper latch with proper positioning. Advised mother to place infant tummy to tummy. Mother taught to do cross cradle hold. Infant latched with much better depth.  Mother taught to do breast compression. Observed good burst of suckling and swallows. Observed infant for 10 mins.  Advised mother in hand expression and encouraged cue base feeding.  Discussed cluster feeding. Advised to breastfeed infant 8-12 times or more in 24 hours. Encouraged to do frequent STS.  Mother appreciative of all assistance.  Lactation brochure given to mother and informed of all available resources at [facility], and in the community.   Patient Name: Boy Charlann BoxerSummer Bos Today's Date: 01/19/2018 Reason for consult: Initial assessment   Maternal Data Has patient been taught Hand Expression?: Yes Does the patient have breastfeeding experience prior to this delivery?: Yes  Feeding Feeding Type: Breast Fed  LATCH Score Latch: Grasps breast easily, tongue down, lips flanged, rhythmical sucking.  Audible Swallowing: Spontaneous and intermittent  Type of Nipple: Everted at rest and after stimulation  Comfort (Breast/Nipple): Soft / non-tender  Hold (Positioning): Assistance needed to correctly position infant at breast and maintain latch.  LATCH Score: 9  Interventions Interventions: Assisted with latch;Skin to skin;Hand express;Breast compression;Adjust position;Support pillows;Position options;Expressed milk  Lactation Tools Discussed/Used     Consult Status Consult Status:  Follow-up Date: 01/20/18 Follow-up type: In-patient    Stevan BornKendrick, Arlett Goold [facility] 01/19/2018, 2:32 PM

## 2018-01-20 MED ORDER — IBUPROFEN 600 MG PO TABS: 600.0000 mg | ORAL_TABLET | Freq: Four times a day (QID) | ORAL | 0 refills | Status: DC

## 2018-01-20 NOTE — Lactation Note (Signed)
This note was copied from a baby's chart. Lactation Consultation Note  Patient Name: Sandra Harding Reason for consult: Follow-up assessment;Term;Infant weight loss;Nipple pain/trauma;Other (Comment)(6% weight loss / Post Circ )  Baby is 1833 hours old LC  Reviewed doc flow sheets.  Per mom baby came back from the circ at 10:30 amd and has been asleep since.  Baby last good feeding was at 6 am and he fed good.  LC recommended since it had been awhile since baby had fed to attempt .  LC checked diaper / dry/ and assisted mom to latch on the left breast / cross cradle /  Worked on depth/ and noted deep latch with multiple swallows/ baby fed for 20 mins.  Due to the circ and being sluggish and needed intermittent stimmulation to stay awake  For the feeding.  Initially per mom some nipple tenderness, and improved.  Sore nipple and engorgement prevention and tx reviewed mom already has a hand pump And LC provided #27 F for when milk comes in due to areola edema, instructed on reverse pressure,  Shells alternating with comfort gels ( x 6 days ). Mom has shells, hand pump , #24 and #27 F, and comfort  Gels for home.  LC recommended due to areola tenderness - prior to latch - breast massage , hand express, pre-pump  If needed and reverse pressure, and breast compressions with latch until swallows and then allow breast  To hang naturally so the tissue molds in the mouth better for depth.  Per mom had been feeding with the cradle position and LC suspects this is where the soreness occurred.  LC stressed STS feedings until the baby is back to birth weight, steadily gaining, and can stay awake for majority of  The feeding. Discussed nutritive vs non - nutritive feeding patterns and the importance of watching the baby for hanging  Out latched.  Per mom plans to call Sandra Harding/ [facility].  Mother informed of post-discharge support and  given phone number to the lactation department, including services for phone call assistance; out-patient appointments; and breastfeeding support group. List of other breastfeeding resources in the community given in the handout. Encouraged mother to call for problems or concerns related to breastfeeding.    Maternal Data Has patient been taught Hand Expression?: Yes(several large drops noted both breast / areola edema - showed mom hand expressing and reverse pressure )  Feeding Feeding Type: Breast Fed  LATCH Score Latch: Grasps breast easily, tongue down, lips flanged, rhythmical sucking.  Audible Swallowing: Spontaneous and intermittent  Type of Nipple: Everted at rest and after stimulation(areola edema - reverse pressure )  Comfort (Breast/Nipple): Filling, red/small blisters or bruises, mild/mod discomfort  Hold (Positioning): Assistance needed to correctly position infant at breast and maintain latch.  LATCH Score: 8  Interventions Interventions: Breast feeding basics reviewed;Assisted with latch;Skin to skin;Breast massage;Hand express;Reverse pressure;Breast compression;Adjust position;Support pillows;Position options;Expressed milk;Shells;Comfort gels;Hand pump  Lactation Tools Discussed/Used Tools: Shells;Pump;Flanges;Comfort gels Flange Size: 24;27 Shell Type: Inverted Breast pump type: Manual Pump Review: Setup, frequency, and cleaning;Milk Storage(mom already had hand pump - LC reviewed ) Initiated by:: MAI / reviewed  Harding initiated:: 01/20/18   Consult Status Consult Status: Follow-up Harding: (per mom will be able to F/U with Sandra Harding, IBCLC ) Follow-up type: Out-patient    Sandra Harding Harding, 12:31 PM

## 2018-01-20 NOTE — Discharge Instructions (Signed)
Vaginal Delivery, Care After °Refer to this sheet in the next few weeks. These instructions provide you with information about caring for yourself after vaginal delivery. Your health care provider may also give you more specific instructions. Your treatment has been planned according to current medical practices, but problems sometimes occur. Call your health care provider if you have any problems or questions. °What can I expect after the procedure? °After vaginal delivery, it is common to have: °· Some bleeding from your vagina. °· Soreness in your abdomen, your vagina, and the area of skin between your vaginal opening and your anus (perineum). °· Pelvic cramps. °· Fatigue. °Follow these instructions at home: °Medicines °· Take over-the-counter and prescription medicines only as told by your health care provider. °· If you were prescribed an antibiotic medicine, take it as told by your health care provider. Do not stop taking the antibiotic until it is finished. °Driving ° °· Do not drive or operate heavy machinery while taking prescription pain medicine. °· Do not drive for 24 hours if you received a sedative. °Lifestyle °· Do not drink alcohol. This is especially important if you are breastfeeding or taking medicine to relieve pain. °· Do not use tobacco products, including cigarettes, chewing tobacco, or e-cigarettes. If you need help quitting, ask your health care provider. °Eating and drinking °· Drink at least 8 eight-ounce glasses of water every day unless you are told not to by your health care provider. If you choose to breastfeed your baby, you may need to drink more water than this. °· Eat high-fiber foods every day. These foods may help prevent or relieve constipation. High-fiber foods include: °? Whole grain cereals and breads. °? Brown rice. °? Beans. °? Fresh fruits and vegetables. °Activity °· Return to your normal activities as told by your health care provider. Ask your health care provider what  activities are safe for you. °· Rest as much as possible. Try to rest or take a nap when your baby is sleeping. °· Do not lift anything that is heavier than your baby or 10 lb (4.5 kg) until your health care provider says that it is safe. °· Talk with your health care provider about when you can engage in sexual activity. This may depend on your: °? Risk of infection. °? Rate of healing. °? Comfort and desire to engage in sexual activity. °Vaginal Care °· If you have an episiotomy or a vaginal tear, check the area every day for signs of infection. Check for: °? More redness, swelling, or pain. °? More fluid or blood. °? Warmth. °? Pus or a bad smell. °· Do not use tampons or douches until your health care provider says this is safe. °· Watch for any blood clots that may pass from your vagina. These may look like clumps of dark red, brown, or black discharge. °General instructions °· Keep your perineum clean and dry as told by your health care provider. °· Wear loose, comfortable clothing. °· Wipe from front to back when you use the toilet. °· Ask your health care provider if you can shower or take a bath. If you had an episiotomy or a perineal tear during labor and delivery, your health care provider may tell you not to take baths for a certain length of time. °· Wear a bra that supports your breasts and fits you well. °· If possible, have someone help you with household activities and help care for your baby for at least a few days after you   leave the hospital. °· Keep all follow-up visits for you and your baby as told by your health care provider. This is important. °Contact a health care provider if: °· You have: °? Vaginal discharge that has a bad smell. °? Difficulty urinating. °? Pain when urinating. °? A sudden increase or decrease in the frequency of your bowel movements. °? More redness, swelling, or pain around your episiotomy or vaginal tear. °? More fluid or blood coming from your episiotomy or vaginal  tear. °? Pus or a bad smell coming from your episiotomy or vaginal tear. °? A fever. °? A rash. °? Little or no interest in activities you used to enjoy. °? Questions about caring for yourself or your baby. °· Your episiotomy or vaginal tear feels warm to the touch. °· Your episiotomy or vaginal tear is separating or does not appear to be healing. °· Your breasts are painful, hard, or turn red. °· You feel unusually sad or worried. °· You feel nauseous or you vomit. °· You pass large blood clots from your vagina. If you pass a blood clot from your vagina, save it to show to your health care provider. Do not flush blood clots down the toilet without having your health care provider look at them. °· You urinate more than usual. °· You are dizzy or light-headed. °· You have not breastfed at all and you have not had a menstrual period for 12 weeks after delivery. °· You have stopped breastfeeding and you have not had a menstrual period for 12 weeks after you stopped breastfeeding. °Get help right away if: °· You have: °? Pain that does not go away or does not get better with medicine. °? Chest pain. °? Difficulty breathing. °? Blurred vision or spots in your vision. °? Thoughts about hurting yourself or your baby. °· You develop pain in your abdomen or in one of your legs. °· You develop a severe headache. °· You faint. °· You bleed from your vagina so much that you fill two sanitary pads in one hour. °This information is not intended to replace advice given to you by your health care provider. Make sure you discuss any questions you have with your health care provider. °Document Released: 01/05/2000 Document Revised: 06/21/2015 Document Reviewed: 01/22/2015 °Elsevier Interactive Patient Education © 2019 [employer] ° °

## 2018-01-24 ENCOUNTER — Inpatient Hospital Stay (HOSPITAL_COMMUNITY): Admission: RE | Admit: 2018-01-24 | Payer: BLUE CROSS/BLUE SHIELD | Source: Ambulatory Visit

## 2018-01-26 ENCOUNTER — Ambulatory Visit (INDEPENDENT_AMBULATORY_CARE_PROVIDER_SITE_OTHER): Payer: BLUE CROSS/BLUE SHIELD | Admitting: Obstetrics & Gynecology

## 2018-01-26 ENCOUNTER — Encounter: Payer: Self-pay | Admitting: Obstetrics & Gynecology

## 2018-01-26 VITALS — BP 132/79 | HR 85 | Resp 16 | Ht 62.0 in | Wt 173.0 lb

## 2018-01-26 DIAGNOSIS — R102 Pelvic and perineal pain: Secondary | ICD-10-CM

## 2018-01-26 MED ORDER — HYDROCODONE-ACETAMINOPHEN 5-325 MG PO TABS: 1.0000 | ORAL_TABLET | Freq: Four times a day (QID) | ORAL | 0 refills | Status: DC | PRN

## 2018-01-26 NOTE — Progress Notes (Signed)
   Subjective:    Patient ID: Sandra Harding, female    DOB: 07/23/1995, 23 y.o.   MRN: [id number]  HPI  23 year old female presents for follow-up of third-degree laceration a week ago.  Patient had a successful VBAC.  She has been having some pain at the incision site.  She has nothing per vagina.  She is not constipated.  Patient is only taking ibuprofen and was not given narcotics postdelivery.  Patient would like a stronger pain medication at night.  Also stopping breast-feeding.  Review of Systems  Constitutional: Negative.   Respiratory: Negative.   Cardiovascular: Negative.   Gastrointestinal: Negative.   Genitourinary: Positive for vaginal pain.       Objective:   Physical Exam Vitals signs reviewed.  Constitutional:      General: She is not in acute distress.    Appearance: She is well-developed.  HENT:     Head: Normocephalic and atraumatic.  Eyes:     Conjunctiva/sclera: Conjunctivae normal.  Cardiovascular:     Rate and Rhythm: Normal rate.  Pulmonary:     Effort: Pulmonary effort is normal.  Abdominal:     General: Abdomen is flat.     Palpations: Abdomen is soft.  Genitourinary:    Comments: Laceration at normal stages of healing.  No evidence of infection.  Tender over stitches. Skin:    General: Skin is warm and dry.  Neurological:     Mental Status: She is alert and oriented to person, place, and time.           Assessment & Plan:  23 year old female status post successful VBAC and third-degree laceration repair  Norco Rx sent to the pharmacy Pelvic PT at 6 weeks Keep normal postpartum appointment

## 2018-02-16 ENCOUNTER — Ambulatory Visit (INDEPENDENT_AMBULATORY_CARE_PROVIDER_SITE_OTHER): Payer: BLUE CROSS/BLUE SHIELD | Admitting: Family Medicine

## 2018-02-16 ENCOUNTER — Encounter: Payer: Self-pay | Admitting: Family Medicine

## 2018-02-16 VITALS — BP 118/79 | HR 82 | Ht 62.0 in | Wt 169.0 lb

## 2018-02-16 DIAGNOSIS — Z30430 Encounter for insertion of intrauterine contraceptive device: Secondary | ICD-10-CM | POA: Diagnosis not present

## 2018-02-16 DIAGNOSIS — Z3202 Encounter for pregnancy test, result negative: Secondary | ICD-10-CM | POA: Diagnosis not present

## 2018-02-16 DIAGNOSIS — R102 Pelvic and perineal pain: Secondary | ICD-10-CM

## 2018-02-16 DIAGNOSIS — Z01812 Encounter for preprocedural laboratory examination: Secondary | ICD-10-CM

## 2018-02-16 LAB — POCT URINE PREGNANCY: Preg Test, Ur: NEGATIVE

## 2018-02-16 MED ORDER — LEVONORGESTREL 19.5 MCG/DAY IU IUD
INTRAUTERINE_SYSTEM | Freq: Once | INTRAUTERINE | Status: AC
  Administered 2018-02-16: 10:00:00 via INTRAUTERINE

## 2018-02-16 NOTE — Progress Notes (Signed)
Post Partum Exam  Sandra Harding is a 23 y.o. G65P2002 female who presents for a postpartum visit. She is 5 weeks postpartum following a spontaneous vaginal delivery. I have fully reviewed the prenatal and intrapartum course. The delivery was at 40 gestational weeks 3 dyas.  Anesthesia: epidural. Postpartum course has been unremarkable. Baby's course has been unremarkable. Baby is feeding by bottle-Good Start Gentle Pro. Bleeding staining only. Bowel function is normal. Bladder function is normal. Patient is not sexually active. Contraception method is IUD. Postpartum depression screening:neg I have independently verified the above information.  The following portions of the patient's history were reviewed and updated as appropriate: allergies, current medications, past family history, past medical history, past social history, past surgical history and problem list. Maurice Small pap smear done 06/27/2017 and was Normal  Review of Systems Pertinent items noted in HPI and remainder of comprehensive ROS otherwise negative.    Objective:  Blood pressure 118/79, pulse 82, height 5\' 2"  (1.575 m), weight 169 lb (76.7 kg), last menstrual period 04/11/2017, not currently breastfeeding.  General:  alert, cooperative and appears stated age  Lungs: normal effort  Heart:  regular rate and rhythm  Abdomen: soft, non-tender; bowel sounds normal; no masses,  no organomegaly   Vulva:  normal suture material noted  Vagina: normal vagina  Cervix:  no lesions       Procedure: Patient identified, informed consent performed, signed copy in chart, time out was performed.  Urine pregnancy test negative.  Speculum placed in the vagina.  Cervix visualized.  Cleaned with Betadine x 2.  Grasped anteriourly with a single tooth tenaculum.  Uterus sounded to 8 cm.  Liletta IUD placed per manufacturer's recommendations.  Strings trimmed to 3 cm.   Suture material removed from vagina  Assessment:    Normal postpartum exam.  Pap smear not done at today's visit.   Plan:   1. Contraception: IUD 2. Wait to resume sexual activity until vulva is less tender. May return to work 3. Follow up in: 3 weeks for CPE or as needed.  Patient given post procedure instructions and Liletta care card with expiration date.  Patient is asked to check IUD strings periodically and follow up in 4-6 weeks for IUD check.

## 2018-03-03 ENCOUNTER — Other Ambulatory Visit: Payer: Self-pay

## 2018-03-03 ENCOUNTER — Ambulatory Visit: Payer: BLUE CROSS/BLUE SHIELD | Attending: Family Medicine | Admitting: Physical Therapy

## 2018-03-03 ENCOUNTER — Encounter: Payer: Self-pay | Admitting: Physical Therapy

## 2018-03-03 DIAGNOSIS — M6281 Muscle weakness (generalized): Secondary | ICD-10-CM | POA: Insufficient documentation

## 2018-03-03 DIAGNOSIS — R252 Cramp and spasm: Secondary | ICD-10-CM | POA: Diagnosis not present

## 2018-03-03 NOTE — Therapy (Signed)
[facility] [address], [address] [place], [place], [address] Phone: [phone]   Fax:  [phone]  Physical Therapy Evaluation  Patient Details  Name: Sandra Harding MRN: [id number] Date of Birth: 11/22/1995 Referring Provider (PT): Reva BoresPratt, Tanya S, MD   Encounter Date: 03/03/2018  PT End of Session - 03/03/18 1006    Visit Number  1    Date for PT Re-Evaluation  05/26/18    PT Start Time  1006    PT Stop Time  1045    PT Time Calculation (min)  39 min    Activity Tolerance  Patient tolerated treatment well    Behavior During Therapy  [facility] for tasks assessed/performed       Past Medical History:  Diagnosis Date  . Headache   . Medical history non-contributory     Past Surgical History:  Procedure Laterality Date  . CESAREAN SECTION N/A 09/27/2013   Procedure: CESAREAN SECTION;  Surgeon: Lesly DukesKelly H Leggett, MD;  Location: [facility];  Service: Obstetrics;  Laterality: N/A;    There were no vitals filed for this visit.   Subjective Assessment - 03/03/18 1009    Subjective  Pt had infant on Jan 19, 2018.  She has stiches removed on 4 week check up.  She returns again in one month.  It is very tender and hurts when I have BM.  Pt has pain when wiping    Limitations  Other (comment)   toileting   Patient Stated Goals  Get rid of that tenderness    Currently in Pain?  Yes    Pain Score  7     Pain Location  Vagina   sting   Pain Orientation  Lower    Pain Descriptors / Indicators  --   sting   Pain Type  Acute pain         [facility] PT Assessment - 03/03/18 0001      Assessment   Medical Diagnosis  R10.2 (ICD-10-CM) - Pelvic pain    Referring Provider (PT)  Reva BoresPratt, Tanya S, MD    Prior Therapy  No      Precautions   Precautions  None      Restrictions   Weight Bearing Restrictions  No      Balance Screen   Has the patient fallen in the past 6 months  No      Home Environment   Living Environment  Private  residence    Living Arrangements  Children   2 children 3 and 1 month     Prior Function   Level of Independence  Independent      Cognition   Overall Cognitive Status  Within Functional Limits for tasks assessed      Posture/Postural Control   Posture/Postural Control  Postural limitations    Postural Limitations  Rounded Shoulders;Anterior pelvic tilt      ROM / Strength   AROM / PROM / Strength  Strength      Strength   Overall Strength Comments  core strength 4/5; Rt LE 4/5 flexion, abduction, ER; Lt LE 5/5      Palpation   Palpation comment  no diastasis                Objective measurements completed on examination: See above findings.    Pelvic Floor Special Questions - 03/03/18 0001    Prior Pelvic/Prostate Exam  Yes    Date of Last Pelvic/Prostate Exam  --  couple of weeks   Are you Pregnant or attempting pregnancy?  No    Prior Pregnancies  Yes    Number of Pregnancies  2    Number of C-Sections  1    Number of Vaginal Deliveries  1    Any difficulty with labor and deliveries  No    Currently Sexually Active  No    Marinoff Scale  pain prevents any attempts at intercourse    Urinary Leakage  Yes  (Pended)     How often  daily maybe  (Pended)     Pad use  using for bleeding currently so unable to say without that  (Pended)     Activities that cause leaking  Coughing;With strong urge  (Pended)    leaking just before sitting down   Urinary urgency  Yes    Falling out feeling (prolapse)  No    Skin Integrity  Intact    Scar  Tear;Well healed;Painful;Restricted    Perineal Body/Introitus   Descended    Prolapse  None    Pelvic Floor Internal Exam  pt identity confirmed and informed consent given to perform internal soft tissue assessment and treat    Exam Type  Vaginal    Strength  weak squeeze, no lift    Strength # of seconds  2       [facility] Adult PT Treatment/Exercise - 03/03/18 0001      Self-Care   Self-Care  Other Self-Care Comments     Other Self-Care Comments   urge and toileting techniques, self massage             PT Education - 03/03/18 1741    Education Details  toileting techniques, urge to void, self massage    Person(s) Educated  Patient    Methods  Explanation;Demonstration;Handout;Verbal cues    Comprehension  Verbalized understanding;Returned demonstration          PT Long Term Goals - 03/03/18 1049      PT LONG TERM GOAL #1   Title  Pt will have 50% less pain in perineum    Time  8    Period  Weeks    Status  New    Target Date  04/28/18      PT LONG TERM GOAL #2   Title  Pt will be able to cough without any leakage due to improved muscle coordination    Time  8    Period  Weeks    Status  New    Target Date  04/28/18      PT LONG TERM GOAL #3   Title  Pt will be able to wait at least 5 minutes after the initial urge to go to the bathroom so she can complete household tasks without leakage    Time  8    Period  Weeks    Status  New    Target Date  04/28/18      PT LONG TERM GOAL #4   Title  Pt will be ind with advanced HEP    Time  8    Period  Weeks    Status  New    Target Date  04/28/18             Plan - 03/03/18 1743    Clinical Impression Statement  Pt presents to clinic due to perineal pain that she has had since delivery one month ago.  Pt has scar tissue restriction and fascial adhesions in the  area where 3rd degree perineal tear was.  Pt has LE weakness and pelvic floor weakness with high tone.  Pt has scar tissue adhesion and fascial adhesion.  She has core weakness. Pt will benefit from skilled PT to address these impairments so she can improve overall function and reduce pain.    History and Personal Factors relevant to plan of care:  vaginal delivery, 3rd deg tearing    Clinical Presentation  Stable    Clinical Decision Making  Low    Rehab Potential  Excellent    PT Frequency  1x / week    PT Duration  12 weeks    PT Treatment/Interventions  ADLs/Self  Care Home Management;Biofeedback;Cryotherapy;Electrical Stimulation;Moist Heat;Therapeutic activities;Therapeutic exercise;Neuromuscular re-education;Manual techniques;Dry needling;Passive range of motion;Scar mobilization;Taping    PT Next Visit Plan  biofeedback    Consulted and Agree with Plan of Care  Patient       Patient will benefit from skilled therapeutic intervention in order to improve the following deficits and impairments:  Increased fascial restricitons, Pain, Increased muscle spasms, Decreased endurance, Decreased strength  Visit Diagnosis: Cramp and spasm  Muscle weakness (generalized)     Problem List Patient Active Problem List   Diagnosis Date Noted  . History of gestational hypertension 01/19/2018  . 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 History of migraine 06/27/2017    Vincente Poli, PT 03/03/2018, 5:50 PM  [facility] [address], [address] [place], [place], [address] Phone: [phone]   Fax:  [phone]  Name: Sandra Harding MRN: [id number] Date of Birth: 06-17-1995

## 2018-03-03 NOTE — Patient Instructions (Signed)
Toileting Techniques for Bowel Movements (Defecation) Using your belly (abdomen) and pelvic floor muscles to have a bowel movement is usually instinctive.  Sometimes people can have problems with these muscles and have to relearn proper defecation (emptying) techniques.  If you have weakness in your muscles, organs that are falling out, decreased sensation in your pelvis, or ignore your urge to go, you may find yourself straining to have a bowel movement.  You are straining if you are: . holding your breath or taking in a huge gulp of air and holding it  . keeping your lips and jaw tensed and closed tightly . turning red in the face because of excessive pushing or forcing . developing or worsening your  hemorrhoids . getting faint while pushing . not emptying completely and have to defecate many times a day  If you are straining, you are actually making it harder for yourself to have a bowel movement.  Many people find they are pulling up with the pelvic floor muscles and closing off instead of opening the anus. Due to lack pelvic floor relaxation and coordination the abdominal muscles, one has to work harder to push the feces out.  Many people have never been taught how to defecate efficiently and effectively.  Notice what happens to your body when you are having a bowel movement.  While you are sitting on the toilet pay attention to the following areas: . Jaw and mouth position . Angle of your hips   . Whether your feet touch the ground or not . Arm placement  . Spine position . Waist . Belly tension . Anus (opening of the anal canal)  An Evacuation/Defecation Plan   Here are the 4 basic points:  1. Lean forward enough for your elbows to rest on your knees 2. Support your feet on the floor or use a low stool if your feet don't touch the floor  3. Push out your belly as if you have swallowed a beach ball-you should feel a widening of your waist 4. Open and relax your pelvic floor muscles,  rather than tightening around the anus      The following conditions my require modifications to your toileting posture:  . If you have had surgery in the past that limits your back, hip, pelvic, knee or ankle flexibility . Constipation   Your healthcare practitioner may make the following additional suggestions and adjustments:  1) Sit on the toilet  a) Make sure your feet are supported. b) Notice your hip angle and spine position-most people find it effective to lean forward or raise their knees, which can help the muscles around the anus to relax  c) When you lean forward, place your forearms on your thighs for support  2) Relax suggestions a) Breath deeply in through your nose and out slowly through your mouth as if you are smelling the flowers and blowing out the candles. b) To become aware of how to relax your muscles, contracting and releasing muscles can be helpful.  Pull your pelvic floor muscles in tightly by using the image of holding back gas, or closing around the anus (visualize making a circle smaller) and lifting the anus up and in.  Then release the muscles and your anus should drop down and feel open. Repeat 5 times ending with the feeling of relaxation. c) Keep your pelvic floor muscles relaxed; let your belly bulge out. d) The digestive tract starts at the mouth and ends at the anal opening, so be   sure to relax both ends of the tube.  Place your tongue on the roof of your mouth with your teeth separated.  This helps relax your mouth and will help to relax the anus at the same time.  3) Empty (defecation) a) Keep your pelvic floor and sphincter relaxed, then bulge your anal muscles.  Make the anal opening wide.  b) Stick your belly out as if you have swallowed a beach ball. c) Make your belly wall hard using your belly muscles while continuing to breathe. Doing this makes it easier to open your anus. d) Breath out and give a grunt (or try using other sounds such as  ahhhh, shhhhh, ohhhh or grrrrrrr).  4) Finish a) As you finish your bowel movement, pull the pelvic floor muscles up and in.  This will leave your anus in the proper place rather than remaining pushed out and down. If you leave your anus pushed out and down, it will start to feel as though that is normal and give you incorrect signals about needing to have a bowel movement.    Relaxation Exercises with the Urge to Void   When you experience an urge to void:  FIRST  Stop and stand very still    Sit down if you can    Don't move    You need to stay very still to maintain control  SECOND Squeeze your pelvic floor muscles 5 times, like a quick flick, to keep from leaking  THIRD Relax  Take a deep breath and then let it out  Try to make the urge go away by using relaxation and visualization techniques  FINALLY When you feel the urge go away somewhat, walk normally to the bathroom.   If the urge gets suddenly stronger on the way, you may stop again and relax to regain control.  STRETCHING THE PELVIC FLOOR MUSCLES NO DILATOR  Supplies . Vaginal lubricant . Mirror (optional) . Gloves (optional) Positioning . Start in a semi-reclined position with your head propped up. Bend your knees and place your thumb or finger at the vaginal opening. Procedure . Apply a moderate amount of lubricant on the outer skin of your vagina, the labia minora.  Apply additional lubricant to your finger. Marland Kitchen Spread the skin away from the vaginal opening. Place the end of your finger at the opening. . Do a maximum contraction of the pelvic floor muscles. Tighten the vagina and the anus maximally and relax. . When you know they are relaxed, gently and slowly insert your finger into your vagina, directing your finger slightly downward, for 2-3 inches of insertion. . Relax and stretch the 6 o'clock position . Hold each stretch for _2 min__ and repeat __1_ time with rest breaks of _1__ seconds between each  stretch. . Repeat the stretching in the 4 o'clock and 8 o'clock positions. . Total time should be _6__ minutes, _1__ x per day.  Note the amount of theme your were able to achieve and your tolerance to your finger in your vagina. . Once you have accomplished the techniques you may try them in standing with one foot resting on the tub, or in other positions.  This is a good stretch to do in the shower if you don't need to use lubricant.

## 2018-03-13 ENCOUNTER — Ambulatory Visit: Payer: BLUE CROSS/BLUE SHIELD | Admitting: Physical Therapy

## 2018-03-19 ENCOUNTER — Encounter: Payer: Self-pay | Admitting: Physical Therapy

## 2018-03-23 ENCOUNTER — Ambulatory Visit: Payer: Self-pay | Attending: Family Medicine | Admitting: Physical Therapy

## 2018-03-23 DIAGNOSIS — M6281 Muscle weakness (generalized): Secondary | ICD-10-CM | POA: Insufficient documentation

## 2018-03-23 DIAGNOSIS — R252 Cramp and spasm: Secondary | ICD-10-CM | POA: Insufficient documentation

## 2018-03-23 NOTE — Therapy (Addendum)
[facility] [address], [place], [place], [address] Phone: [phone]   Fax:  [phone]  Physical Therapy Treatment  Patient Details  Name: Sandra Harding MRN: [id number] Date of Birth: 1995-02-14 Referring Provider (PT): Donnamae Jude, MD   Encounter Date: 03/23/2018  PT End of Session - 03/23/18 0917    Visit Number  2    Date for PT Re-Evaluation  05/26/18    PT Start Time  0920    PT Stop Time  1012    PT Time Calculation (min)  52 min    Activity Tolerance  Patient tolerated treatment well    Behavior During Therapy  [facility] for tasks assessed/performed       Past Medical History:  Diagnosis Date  . Headache   . Medical history non-contributory     Past Surgical History:  Procedure Laterality Date  . CESAREAN SECTION N/A 09/27/2013   Procedure: CESAREAN SECTION;  Surgeon: Guss Bunde, MD;  Location: [facility];  Service: Obstetrics;  Laterality: N/A;    There were no vitals filed for this visit.  Subjective Assessment - 03/23/18 0924    Subjective  I have been doing the massage.  I am able to reduce the urge to void much better now.  I still feel the tightness when I go to the bathroom.  I am tender after some walking, intial penetration with intercourse.    Patient Stated Goals  Get rid of that tenderness    Currently in Pain?  No/denies                       [facility] Adult PT Treatment/Exercise - 03/23/18 0001      Self-Care   Other Self-Care Comments   toileting and lubricants with samples given      Neuro Re-ed    Neuro Re-ed Details   diapragmatic breathing in supine and then cued to do with stretches      Exercises   Exercises  Lumbar      Lumbar Exercises: Stretches   Double Knee to Chest Stretch  2 reps;30 seconds   happy baby and childs pose   Lower Trunk Rotation  5 reps;10 seconds    Prone on Elbows Stretch Limitations  cat cow - 5 x    Figure 4 Stretch  2 reps;20  seconds    Other Lumbar Stretch Exercise  sidelying rotation      Manual Therapy   Manual Therapy  Soft tissue mobilization    Manual therapy comments  pt informed and identity confirmed to give consent for internal soft tissue assessment    Soft tissue mobilization  scar tissue and tactile cues with breathing to relax and bulge             PT Education - 03/23/18 1013    Education Details   Access Code: [place] and toileting techniques    Person(s) Educated  Patient    Methods  Explanation;Demonstration;Tactile cues;Verbal cues;Handout    Comprehension  Verbalized understanding;Returned demonstration          PT Long Term Goals - 03/03/18 1049      PT LONG TERM GOAL #1   Title  Pt will have 50% less pain in perineum    Time  8    Period  Weeks    Status  New    [place] Date  04/28/18      PT LONG TERM GOAL #2  Title  Pt will be able to cough without any leakage due to improved muscle coordination    Time  8    Period  Weeks    Status  New    Target Date  04/28/18      PT LONG TERM GOAL #3   Title  Pt will be able to wait at least 5 minutes after the initial urge to go to the bathroom so she can complete household tasks without leakage    Time  8    Period  Weeks    Status  New    Target Date  04/28/18      PT LONG TERM GOAL #4   Title  Pt will be ind with advanced HEP    Time  8    Period  Weeks    Status  New    Target Date  04/28/18            Plan - 03/23/18 1015    Clinical Impression Statement  Pt continues to have fascial restrictions around perineum.  She has high tone but able to relax with cues to breath correctly and bulge pelvic floor.  Pt was educated on stretches today and did well with understanding additions to HEP.  Pt will follow up in 2 weeks due to being self pay now and she will need to see how much the treatment costs.    PT Treatment/Interventions  ADLs/Self Care Home Management;Biofeedback;Cryotherapy;Electrical  Stimulation;Moist Heat;Therapeutic activities;Therapeutic exercise;Neuromuscular re-education;Manual techniques;Dry needling;Passive range of motion;Scar mobilization;Taping    PT Next Visit Plan  biofeedback    Consulted and Agree with Plan of Care  Patient       Patient will benefit from skilled therapeutic intervention in order to improve the following deficits and impairments:  Increased fascial restricitons, Pain, Increased muscle spasms, Decreased endurance, Decreased strength  Visit Diagnosis: Cramp and spasm  Muscle weakness (generalized)     Problem List Patient Active Problem List   Diagnosis Date Noted  . History of gestational hypertension 01/19/2018  . 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 History of migraine 06/27/2017    Zannie Cove, PT 03/23/2018, 11:24 AM  [facility] [address], [place], [place], [address] Phone: [phone]   Fax:  [phone]  Name: Sandra Harding MRN: [id number] Date of Birth: [birth year]  PHYSICAL THERAPY DISCHARGE SUMMARY  Visits from Start of Care: 2  Current functional level related to goals / functional outcomes: See above details   Remaining deficits: See above   Education / Equipment: HEP  Plan: Patient agrees to discharge.  Patient goals were not met. Patient is being discharged due to not returning since the last visit.  ?????     [name], PT 05/30/18 2:12 PM

## 2018-03-23 NOTE — Patient Instructions (Signed)
Toileting Techniques for Bowel Movements (Defecation) Using your belly (abdomen) and pelvic floor muscles to have a bowel movement is usually instinctive.  Sometimes people can have problems with these muscles and have to relearn proper defecation (emptying) techniques.  If you have weakness in your muscles, organs that are falling out, decreased sensation in your pelvis, or ignore your urge to go, you may find yourself straining to have a bowel movement.  You are straining if you are: . holding your breath or taking in a huge gulp of air and holding it  . keeping your lips and jaw tensed and closed tightly . turning red in the face because of excessive pushing or forcing . developing or worsening your  hemorrhoids . getting faint while pushing . not emptying completely and have to defecate many times a day  If you are straining, you are actually making it harder for yourself to have a bowel movement.  Many people find they are pulling up with the pelvic floor muscles and closing off instead of opening the anus. Due to lack pelvic floor relaxation and coordination the abdominal muscles, one has to work harder to push the feces out.  Many people have never been taught how to defecate efficiently and effectively.  Notice what happens to your body when you are having a bowel movement.  While you are sitting on the toilet pay attention to the following areas: . Jaw and mouth position . Angle of your hips   . Whether your feet touch the ground or not . Arm placement  . Spine position . Waist . Belly tension . Anus (opening of the anal canal)  An Evacuation/Defecation Plan   Here are the 4 basic points:  1. Lean forward enough for your elbows to rest on your knees 2. Support your feet on the floor or use a low stool if your feet don't touch the floor  3. Push out your belly as if you have swallowed a beach ball-you should feel a widening of your waist 4. Open and relax your pelvic floor muscles,  rather than tightening around the anus      The following conditions my require modifications to your toileting posture:  . If you have had surgery in the past that limits your back, hip, pelvic, knee or ankle flexibility . Constipation   Your healthcare practitioner may make the following additional suggestions and adjustments:  1) Sit on the toilet  a) Make sure your feet are supported. b) Notice your hip angle and spine position-most people find it effective to lean forward or raise their knees, which can help the muscles around the anus to relax  c) When you lean forward, place your forearms on your thighs for support  2) Relax suggestions a) Breath deeply in through your nose and out slowly through your mouth as if you are smelling the flowers and blowing out the candles. b) To become aware of how to relax your muscles, contracting and releasing muscles can be helpful.  Pull your pelvic floor muscles in tightly by using the image of holding back gas, or closing around the anus (visualize making a circle smaller) and lifting the anus up and in.  Then release the muscles and your anus should drop down and feel open. Repeat 5 times ending with the feeling of relaxation. c) Keep your pelvic floor muscles relaxed; let your belly bulge out. d) The digestive tract starts at the mouth and ends at the anal opening, so be   sure to relax both ends of the tube.  Place your tongue on the roof of your mouth with your teeth separated.  This helps relax your mouth and will help to relax the anus at the same time.  3) Empty (defecation) a) Keep your pelvic floor and sphincter relaxed, then bulge your anal muscles.  Make the anal opening wide.  b) Stick your belly out as if you have swallowed a beach ball. c) Make your belly wall hard using your belly muscles while continuing to breathe. Doing this makes it easier to open your anus. d) Breath out and give a grunt (or try using other sounds such as  ahhhh, shhhhh, ohhhh or grrrrrrr).  4) Finish a) As you finish your bowel movement, pull the pelvic floor muscles up and in.  This will leave your anus in the proper place rather than remaining pushed out and down. If you leave your anus pushed out and down, it will start to feel as though that is normal and give you incorrect signals about needing to have a bowel movement.   [facility] [address] [place], [place] [address]  

## 2018-03-31 ENCOUNTER — Ambulatory Visit: Payer: Medicaid Other | Admitting: Advanced Practice Midwife

## 2018-04-01 ENCOUNTER — Ambulatory Visit (INDEPENDENT_AMBULATORY_CARE_PROVIDER_SITE_OTHER): Payer: Medicaid Other | Admitting: Obstetrics and Gynecology

## 2018-04-01 ENCOUNTER — Encounter: Payer: Self-pay | Admitting: Obstetrics and Gynecology

## 2018-04-01 ENCOUNTER — Other Ambulatory Visit: Payer: Self-pay

## 2018-04-01 VITALS — BP 105/68 | HR 59 | Ht 64.0 in | Wt 181.0 lb

## 2018-04-01 DIAGNOSIS — Z30431 Encounter for routine checking of intrauterine contraceptive device: Secondary | ICD-10-CM | POA: Insufficient documentation

## 2018-04-01 NOTE — Progress Notes (Signed)
    GYNECOLOGY OFFICE ENCOUNTER NOTE  History:  23 y.o. [id number] here today for today for IUD string check; Liletta  IUD was placed  02/16/2018. No complaints about the IUD, no concerning side effects.  The following portions of the patient's history were reviewed and updated as appropriate: allergies, current medications, past family history, past medical history, past social history, past surgical history and problem list. Last pap smear on 06/27/2020 was normal, negative HRHPV.  Review of Systems:  Pertinent items are noted in HPI.  Objective:  Physical Exam Blood pressure 105/68, pulse (!) 59, height 5\' 4"  (1.626 m), weight 181 lb (82.1 kg), not currently breastfeeding. CONSTITUTIONAL: Well-developed, well-nourished female in no acute distress.  HENT:  Normocephalic, atraumatic. External right and left ear normal. Oropharynx is clear and moist EYES: Conjunctivae and EOM are normal. Pupils are equal, round, and reactive to light. No scleral icterus.  NECK: Normal range of motion, supple, no masses CARDIOVASCULAR: Normal heart rate noted RESPIRATORY: Effort and breath sounds normal, no problems with respiration noted ABDOMEN: Soft, no distention noted.   PELVIC: Normal appearing external genitalia; normal appearing vaginal mucosa and cervix.  IUD strings visualized, about 2 cm in length outside cervix.   Assessment & Plan:  Patient to keep IUD in place for up to 3 years; can come in for removal if she desires pregnancy earlier or for any concerning side effects.   Rasch, Harolyn Rutherford, NP [facility] [employer]

## 2018-04-07 ENCOUNTER — Encounter: Payer: Self-pay | Admitting: Physical Therapy

## 2018-06-22 ENCOUNTER — Encounter: Payer: Self-pay | Admitting: Obstetrics & Gynecology

## 2018-06-22 ENCOUNTER — Other Ambulatory Visit: Payer: Self-pay

## 2018-06-22 ENCOUNTER — Ambulatory Visit (INDEPENDENT_AMBULATORY_CARE_PROVIDER_SITE_OTHER): Payer: Medicaid Other | Admitting: Obstetrics & Gynecology

## 2018-06-22 VITALS — BP 100/64 | HR 53 | Resp 16 | Ht 64.0 in | Wt 171.0 lb

## 2018-06-22 DIAGNOSIS — Z30012 Encounter for prescription of emergency contraception: Secondary | ICD-10-CM

## 2018-06-22 DIAGNOSIS — Z30432 Encounter for removal of intrauterine contraceptive device: Secondary | ICD-10-CM | POA: Diagnosis not present

## 2018-06-22 DIAGNOSIS — Z30430 Encounter for insertion of intrauterine contraceptive device: Secondary | ICD-10-CM

## 2018-06-22 LAB — POCT URINE PREGNANCY: Preg Test, Ur: NEGATIVE

## 2018-06-22 MED ORDER — NORETHIN ACE-ETH ESTRAD-FE 1-20 MG-MCG(24) PO TABS: 1.0000 | ORAL_TABLET | Freq: Every day | ORAL | 11 refills | Status: DC

## 2018-06-22 MED ORDER — LEVONORGESTREL 19.5 MCG/DAY IU IUD: INTRAUTERINE_SYSTEM | Freq: Once | INTRAUTERINE | Status: DC

## 2018-06-22 NOTE — Progress Notes (Signed)
   Subjective:    Patient ID: Sandra Harding, female    DOB: [birth year], 23 y.o.   MRN: [id number]  HPI  Patient presents to discuss CT results done at [facility].  Patient was seen in the emergency department and had a CT for intractable nausea vomiting.  CT showed malposition of the IUD possibly perforating the anterior wall of the uterus.  Patient had IUD placed at her postpartum visit and her strings were noted to be long at her 6-week checkup.  Patient not have any abdominal pain currently.  Patient not have any abnormal vaginal bleeding.  Patient would like the IUD removed and to discuss other forms of contraception.  Patient leaning towards birth control pills.  She has had trouble remembering them in the past but she thinks she can do at this time because she has to give her daughter medication every morning.  She declines Depo Provera, Nexplanon, replacement of an IUD, NuvaRing.  Review of Systems  Constitutional: Negative.   Respiratory: Negative.   Cardiovascular: Negative.   Gastrointestinal: Negative.   Genitourinary: Negative.        Objective:   Physical Exam Vitals signs reviewed.  Constitutional:      General: She is not in acute distress.    Appearance: She is well-developed.  HENT:     Head: Normocephalic and atraumatic.  Eyes:     Conjunctiva/sclera: Conjunctivae normal.  Cardiovascular:     Rate and Rhythm: Normal rate.  Pulmonary:     Effort: Pulmonary effort is normal.  Genitourinary:    Comments: IUD strings noted to be very long.  They were left around the cervix.  IUD was easily removed without extra effort. Skin:    General: Skin is warm and dry.  Neurological:     Mental Status: She is alert and oriented to person, place, and time.       Assessment & Plan:  23 year old female with malpositioned IUD and needing birth control.  1.  Reviewed CT results and discussed removing IUD in the office.  If the IUD is not removed easily we  will abort procedure and go to the operating room.. Noted above the IUD came out without incident. 2.  After extensive counseling on birth control options, patient decides for oral contraception.  Patient was counseled about side effects of oral contraception and will call our office if she has any questions.  Scription for birth control pills sent to her pharmacy.  25 minutes spent face-to-face with the patient with greater than 50% counseling.  IUD Removal Patient identified, informed consent performed. Discussed risks of irregular bleeding, cramping, infection, malpositioning or misplacement of the IUD outside the uterus which may require further procedures. Time out was performed. Speculum placed in the vagina. Cervix visualized. The strings of the IUD were grasped and pulled using ring forceps. The IUD was successfully removed in its entirety.

## 2018-09-15 ENCOUNTER — Other Ambulatory Visit: Payer: Self-pay

## 2018-09-16 ENCOUNTER — Ambulatory Visit (INDEPENDENT_AMBULATORY_CARE_PROVIDER_SITE_OTHER): Payer: Self-pay | Admitting: Medical

## 2018-09-16 ENCOUNTER — Encounter: Payer: Self-pay | Admitting: Medical

## 2018-09-16 VITALS — BP 118/65 | HR 65 | Temp 97.6°F | Resp 18 | Ht 64.0 in | Wt 160.8 lb

## 2018-09-16 DIAGNOSIS — R519 Headache, unspecified: Secondary | ICD-10-CM

## 2018-09-16 DIAGNOSIS — R531 Weakness: Secondary | ICD-10-CM

## 2018-09-16 DIAGNOSIS — R413 Other amnesia: Secondary | ICD-10-CM

## 2018-09-16 DIAGNOSIS — R4182 Altered mental status, unspecified: Secondary | ICD-10-CM

## 2018-09-16 DIAGNOSIS — R5383 Other fatigue: Secondary | ICD-10-CM

## 2018-09-16 DIAGNOSIS — R269 Unspecified abnormalities of gait and mobility: Secondary | ICD-10-CM

## 2018-09-16 DIAGNOSIS — G629 Polyneuropathy, unspecified: Secondary | ICD-10-CM

## 2018-09-16 DIAGNOSIS — R51 Headache: Secondary | ICD-10-CM

## 2018-09-16 DIAGNOSIS — H814 Vertigo of central origin: Secondary | ICD-10-CM

## 2018-09-16 LAB — CBC WITH DIFFERENTIAL/PLATELET
Basophils Absolute: 0 10*3/uL (ref 0.0–0.1)
Basophils Relative: 0.6 % (ref 0.0–3.0)
Eosinophils Absolute: 0.2 10*3/uL (ref 0.0–0.7)
Eosinophils Relative: 2.9 % (ref 0.0–5.0)
HCT: 38.4 % (ref 36.0–46.0)
Hemoglobin: 12.5 g/dL (ref 12.0–15.0)
Lymphocytes Relative: 23.3 % (ref 12.0–46.0)
Lymphs Abs: 1.4 10*3/uL (ref 0.7–4.0)
MCHC: 32.7 g/dL (ref 30.0–36.0)
MCV: 92.9 fl (ref 78.0–100.0)
Monocytes Absolute: 0.4 10*3/uL (ref 0.1–1.0)
Monocytes Relative: 6 % (ref 3.0–12.0)
Neutro Abs: 4.1 10*3/uL (ref 1.4–7.7)
Neutrophils Relative %: 67.2 % (ref 43.0–77.0)
Platelets: 323 10*3/uL (ref 150.0–400.0)
RBC: 4.13 Mil/uL (ref 3.87–5.11)
RDW: 12.2 % (ref 11.5–15.5)
WBC: 6.2 10*3/uL (ref 4.0–10.5)

## 2018-09-16 LAB — COMPREHENSIVE METABOLIC PANEL
ALT: 13 U/L (ref 0–35)
AST: 13 U/L (ref 0–37)
Albumin: 4.3 g/dL (ref 3.5–5.2)
Alkaline Phosphatase: 52 U/L (ref 39–117)
BUN: 9 mg/dL (ref 6–23)
CO2: 24 mEq/L (ref 19–32)
Calcium: 9.3 mg/dL (ref 8.4–10.5)
Chloride: 104 mEq/L (ref 96–112)
Creatinine, Ser: 0.77 mg/dL (ref 0.40–1.20)
GFR: 91.12 mL/min (ref >60.00)
Glucose, Bld: 83 mg/dL (ref 70–99)
Potassium: 4.6 mEq/L (ref 3.5–5.1)
Sodium: 138 mEq/L (ref 135–145)
Total Bilirubin: 0.5 mg/dL (ref 0.2–1.2)
Total Protein: 7.1 g/dL (ref 6.0–8.3)

## 2018-09-16 LAB — VITAMIN B12: Vitamin B-12: 207 pg/mL — ABNORMAL LOW (ref 211–911)

## 2018-09-16 LAB — TSH: TSH: 2.65 u[IU]/mL (ref 0.35–4.50)

## 2018-09-16 LAB — FOLATE: Folate: 10.5 ng/mL (ref >5.9)

## 2018-09-16 NOTE — Patient Instructions (Signed)
With your history of migraines, recent dizziness,fatigue,  weakness,  memory changes and neuropathy, I decided to get CBC, CMP, TSH, B1, B12 and folate.  In addition due to chronicity of your symptoms decided to go ahead and get CT of head without contrast.  Your symptoms wax and wane during the day but never constant.  If you get severe constant symptoms lasting more than 5-10  minutes then recommend ED evaluation.  We will let you know results when studies come in.  Would recommend that you investigate getting health insurance as you might need a neurology referral in the event of negative work-up done today.  Follow-up in 7 to 10 days or as needed.

## 2018-09-16 NOTE — Progress Notes (Signed)
Subjective:    Patient ID: Sandra Harding, female    DOB: [birth year], 23 y.o.   MRN: [id number]  HPI   Pt is new pt.  Pt works [employer]. Pt does try to exercise. 2 children. One special needs 7 yo. She states moderate healthy diet.   Hx of some abdomen in past for which evaluated in ED. Related to misplaced IUD per pt.  Pt updates me that she does not have health insurance. She does have health savings.   Patient here with concerns of new symptoms. . Patient received IUD in February 2020 then began having severe pelvic pain and nausea. Went to the ED and discovered IUD was misplaced. OBGYN removed IUD and she started oral contraceptives. She is on her 4th packet. However, over the past month and half she has started having unusual symptoms. OBGYN does not think these symptoms are related to OCP.Symptoms include memory loss, stumbling while walking causing her to lean into the wall, and dizziness/lightheadness/flushing when looking up and reaching overhead.   Memory loss: Reports having a lot to keep up with regarding children anyway, but more recently memory has worsened. Initially contributed it to "pregnancy brain." She is now reporting significant short term memory problems. For example, she often cannot remember if she's given her daughter her medication doses (four times per day). She is having to set alarms and reminders to keep on track, but by the time she turns off the alarm, pulls up the medication, she then cannot remember if she had already given. Also cannot remember if she washes her body when showering, she repeats. These forgetful episodes have happened about 15x in the past month.  Unsteady gait: consistently stumbling, having to catch herself after only 2-3 steps. Notices that she almost always "side-steps" to the left side. Gait issues occurs once every 2 days. Some days worse than others.   Lightheadedness/dizziness with reaching up and grabbing things from  shelves has been ongoing for the past year, but worse in the past month. Cannot look up for more than a few seconds and then needs 3-5 minutes to recover. Frequency of light headed and dizziness daily.   Some tingling to lower legs 5 times.   Patient reports vision had been perfect up until a few years ago when she had minor blurring, but no recommendation from eye doctor. At the beginning of this year her vision began worsening, and over the past 2 months she realized  it was "horrible." She saw eye doctor 2-3 weeks ago and received prescription glasses. Wearing glasses has not improved any of her previously stated symptoms.    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 At that time dx she had acute confusion for 45 minutes. She forgot her daughters name and boyfriend name. Pt was 75 year old. Pt went to ED but not ct imaging. Pt states ha migraine features. Gave migraine med in ED and symptoms resolved in ED.  Review of Systems  Constitutional: Positive for fatigue. Negative for chills.       Mild.   HENT: Negative for ear pain and hearing loss.   Eyes: Positive for visual disturbance.       Recent trouble seeing, just got glasses 2 weeks ago. And states visual improvement.  Respiratory: Negative for chest tightness and shortness of breath.   Cardiovascular: Negative for chest pain, palpitations and leg swelling.  Gastrointestinal: Negative for abdominal pain and nausea.  Occasional nausea when having lightheaded episodes  Genitourinary: Negative for dysuria and hematuria.  Musculoskeletal: Positive for gait problem.       Stumbling consistently after taking only 2-3 steps, usually stumbles to the left side. Has not fully fallen to the floor. On-going for about 4-5 weeks. Does not feel lightheaded or dizzy when this happens.  Skin: Negative for rash and wound.  Neurological: Positive for dizziness, light-headedness and numbness.       Memory impairment: has had 15 episodes  over the past month of forgetting things almost immediately (double dosing her daughter, not remembering changing diapers or washing herself in the shower, etc)  Lightheaded/dizziness has been occurring for the past year, but worsening over the past 4 weeks. After looking up and reaching for something for just a couple seconds she immediately; takes about 3-5 minutes to recover and feel normal.  LLE tingling and numbness no precipitating factors. Has happened maybe 5x in the past month, each episode last 20-25 minutes, always while sitting.  Hematological: Negative for adenopathy. Does not bruise/bleed easily.  Psychiatric/Behavioral: Negative for behavioral problems, confusion, dysphoric mood, sleep disturbance and suicidal ideas. The patient is not nervous/anxious.        Pt does report feeling stressed a lot. Daughter is most stressful. Special needs.  Gets angry easier recently. Usually more layed back.    Past Medical History:  Diagnosis Date   Headache    Medical history non-contributory      Social History   Socioeconomic History   Marital status: Single    Spouse name: Not on file   Number of children: 1   Years of education: Not on file   Highest education level: Not on file  Occupational History   Not on file  Social Needs   Financial resource strain: Not on file   Food insecurity    Worry: Not on file    Inability: Not on file   Transportation needs    Medical: Not on file    Non-medical: Not on file  Tobacco Use   Smoking status: Former Smoker   Smokeless tobacco: Never Used  Substance and Sexual Activity   Alcohol use: No   Drug use: Not Currently    Types: Marijuana   Sexual activity: Yes    Birth control/protection: I.U.D.  Lifestyle   Physical activity    Days per week: Not on file    Minutes per session: Not on file   Stress: Not on file  Relationships   Social connections    Talks on phone: Not on file    Gets together: Not on  file    Attends religious service: Not on file    Active member of club or organization: Not on file    Attends meetings of clubs or organizations: Not on file    Relationship status: Not on file   Intimate partner violence    Fear of current or ex partner: Not on file    Emotionally abused: Not on file    Physically abused: Not on file    Forced sexual activity: Not on file  Other Topics Concern   Not on file  Social History Narrative   Not on file    Past Surgical History:  Procedure Laterality Date   CESAREAN SECTION N/A 09/27/2013   Procedure: CESAREAN SECTION;  Surgeon: Lesly DukesKelly H Leggett, MD;  Location: [facility];  Service: Obstetrics;  Laterality: N/A;    Family History  Problem Relation Age of Onset  Cancer Maternal Grandfather    Diabetes Paternal Aunt    Heart attack Maternal Grandmother    Alzheimer's disease Paternal Grandfather    Cancer Maternal Uncle     Allergies  Allergen Reactions   Sulfa Antibiotics Hives and Rash    Current Outpatient Medications on File Prior to Visit  Medication Sig Dispense Refill   ibuprofen (ADVIL,MOTRIN) 600 MG tablet Take 1 tablet (600 mg total) by mouth every 6 (six) hours. 30 tablet 0   Levonorgestrel (LILETTA, 52 MG,) 19.5 MCG/DAY IUD IUD 1 each by Intrauterine route once.     Norethindrone Acetate-Ethinyl Estrad-FE (LOESTRIN 24 FE) 1-20 MG-MCG(24) tablet Take 1 tablet by mouth daily. 1 Package 11   ondansetron (ZOFRAN) 24 MG tablet Take 24 mg by mouth once.     sucralfate (CARAFATE) 1 g tablet Take 1 g by mouth 4 (four) times daily -  with meals and at bedtime.     No current facility-administered medications on file prior to visit.     BP 118/65 (BP Location: Right Arm, Patient Position: Sitting, Cuff Size: Normal)    Pulse 65    Temp 97.6 F (36.4 C) (Temporal)    Resp 18    Ht 5\' 4"  (1.626 m)    Wt 160 lb 12.8 oz (72.9 kg)    SpO2 97%    BMI 27.60 kg/m       Objective:   Physical Exam  General Mental  Status- Alert. General Appearance- Not in acute distress.   Skin General: Color- Normal Color. Moisture- Normal Moisture.  Neck Carotid Arteries- Normal color. Moisture- Normal Moisture. No carotid bruits. No JVD.  Chest and Lung Exam Auscultation: Breath Sounds:-Normal.  Cardiovascular Auscultation:Rythm- Regular. Murmurs & Other Heart Sounds:Auscultation of the heart reveals- No Murmurs.  Abdomen Inspection:-Inspeection Normal. Palpation/Percussion:Note:No mass. Palpation and Percussion of the abdomen reveal- Non Tender, Non Distended + BS, no rebound or guarding.   Neurologic Cranial Nerve exam:- CN III-XII intact(No nystagmus), symmetric smile. Drift Test:- No drift. Romberg Exam:- Negative.  Heal to Toe Gait exam:-Normal. Finger to Nose:- Normal/Intact Strength:- 5/5 equal and symmetric strength both upper and lower extremities. Lower ext- sharp and dull discrimination intac.      Assessment & Plan:  With your history of migraines, recent dizziness,fatigue,  weakness,  memory changes and neuropathy, I decided to get CBC, CMP, TSH, B1, B12 and folate.  In addition due to chronicity of your symptoms decided to go ahead and get CT of head without contrast.  Your symptoms wax and wane during the day but never constant.  If you get severe constant symptoms lasting more than 5-10  minutes then recommend ED evaluation.  We will let you know results when studies come in.  Would recommend that you investigate getting health insurance as you might need a neurology referral in the event of negative work-up done today.  Follow-up in 7 to 10 days or as needed.  Initial interview done by Hyman Hopes NP student. I reviewed note and modified note after my interview and exam. Work up and treatment plan formulated by myself.  Esperanza Richters, PA-C

## 2018-09-17 ENCOUNTER — Ambulatory Visit (HOSPITAL_BASED_OUTPATIENT_CLINIC_OR_DEPARTMENT_OTHER)
Admission: RE | Admit: 2018-09-17 | Discharge: 2018-09-17 | Disposition: A | Discharge status: Home / Self Care | Payer: Self-pay | Source: Ambulatory Visit | Attending: Medical | Admitting: Medical

## 2018-09-17 ENCOUNTER — Other Ambulatory Visit: Payer: Self-pay

## 2018-09-17 DIAGNOSIS — R51 Headache: Secondary | ICD-10-CM | POA: Insufficient documentation

## 2018-09-17 DIAGNOSIS — R4182 Altered mental status, unspecified: Secondary | ICD-10-CM | POA: Insufficient documentation

## 2018-09-17 DIAGNOSIS — H814 Vertigo of central origin: Secondary | ICD-10-CM | POA: Insufficient documentation

## 2018-09-17 DIAGNOSIS — R519 Headache, unspecified: Secondary | ICD-10-CM

## 2018-09-20 LAB — VITAMIN B1: Vitamin B1 (Thiamine): <6 nmol/L — ABNORMAL LOW (ref 8–30)

## 2018-09-30 ENCOUNTER — Other Ambulatory Visit: Payer: Self-pay

## 2018-09-30 ENCOUNTER — Ambulatory Visit (INDEPENDENT_AMBULATORY_CARE_PROVIDER_SITE_OTHER): Payer: Medicaid Other

## 2018-09-30 DIAGNOSIS — E538 Deficiency of other specified B group vitamins: Secondary | ICD-10-CM

## 2018-09-30 MED ORDER — CYANOCOBALAMIN 1000 MCG/ML IJ SOLN
1000.0000 ug | Freq: Once | INTRAMUSCULAR | Status: AC
  Administered 2018-09-30: 1000 ug via INTRAMUSCULAR

## 2018-09-30 NOTE — Progress Notes (Addendum)
Pt here for monthly B12 injection per Mackie Pai  B12 1074mcg given IM, and pt tolerated injection well.  Next B12 injection scheduled for week. Patient states she has had worsened dizziness, visual changes even had to go get glasses since she saw you. She started taking otc b12 supplement but is concerned since her symptoms have worsened.  CT normal. Please advise.    Wanted to clarify. Her next b12 is next month. You had documented next week? But do want her to make appointment in a week with me to discuss what is going on. May go ahead and refer to neurologist on next visit. Is this first b12 injection?  If any severe neurologic type symptoms before visit with me then recommend [place] [facility] or [facility] ED evaluatione

## 2018-10-07 ENCOUNTER — Ambulatory Visit: Payer: Medicaid Other

## 2019-02-07 IMAGING — US US MFM OB DETAIL+14 WK
1 series · 14 of 28 positions shown · non-contrast
Comparison: none

[Series 1: us mfm ob detail+14 wk · 14 of 82 slices shown]
[im 4/82]
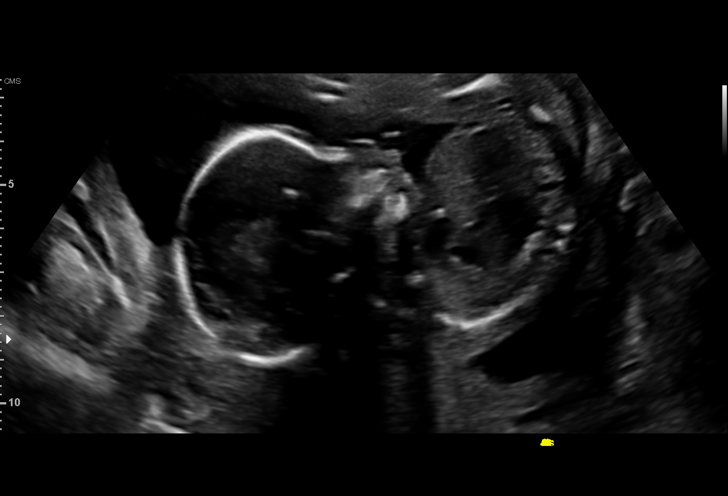
[im 10/82]
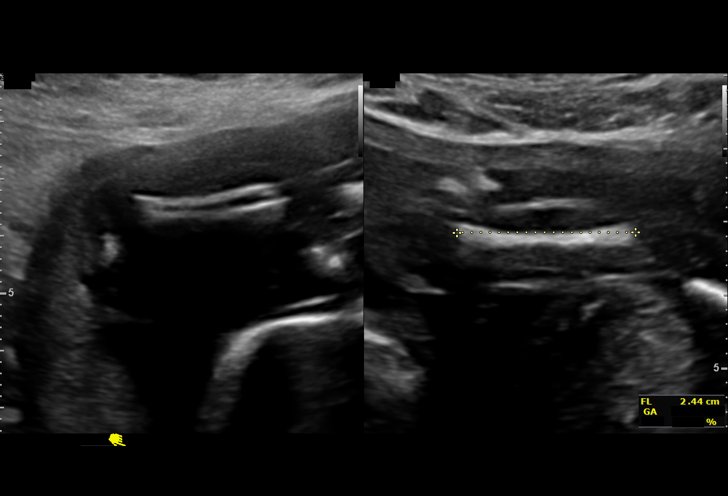
[im 16/82]
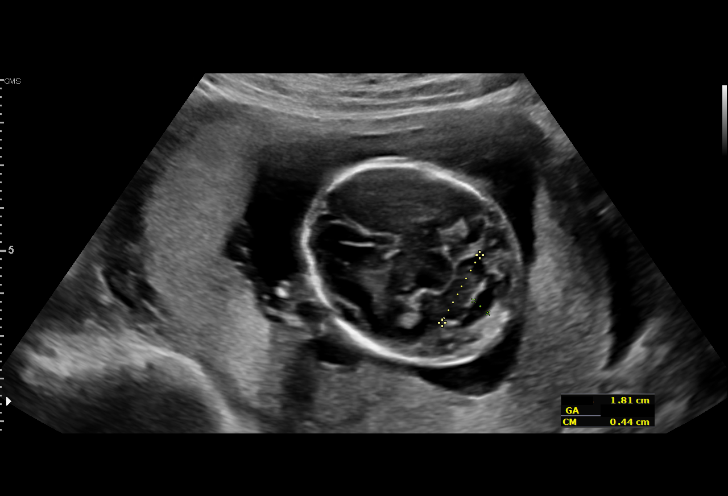
[im 22/82]
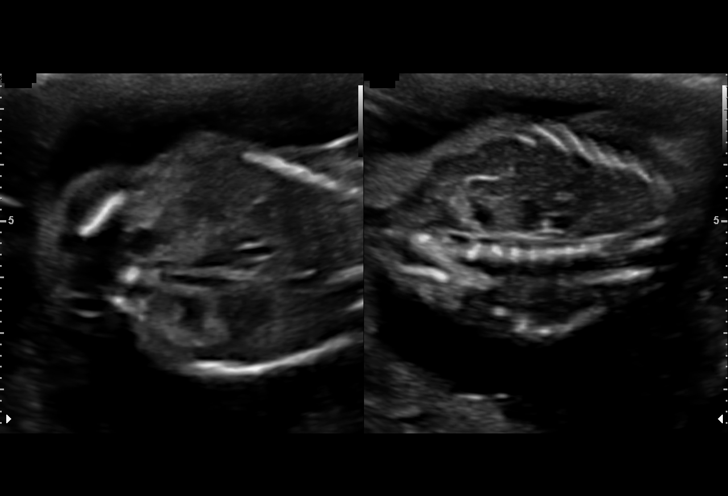
[im 28/82]
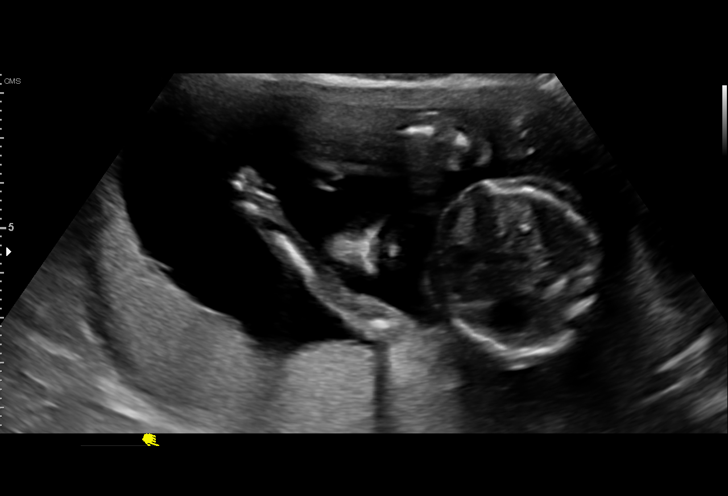
[im 34/82]
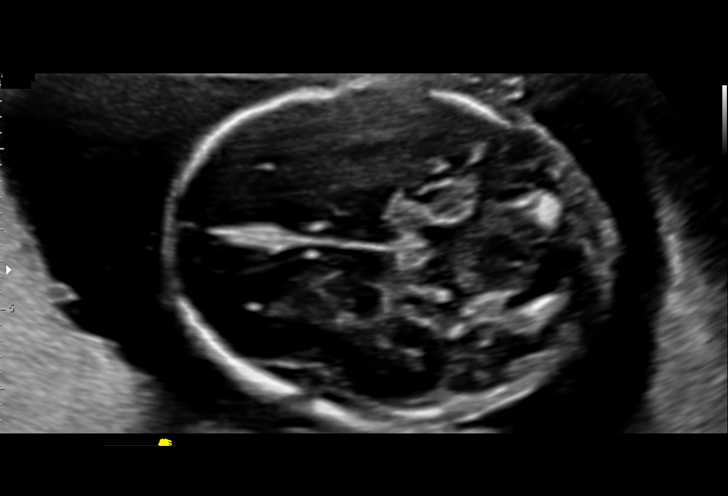
[im 40/82]
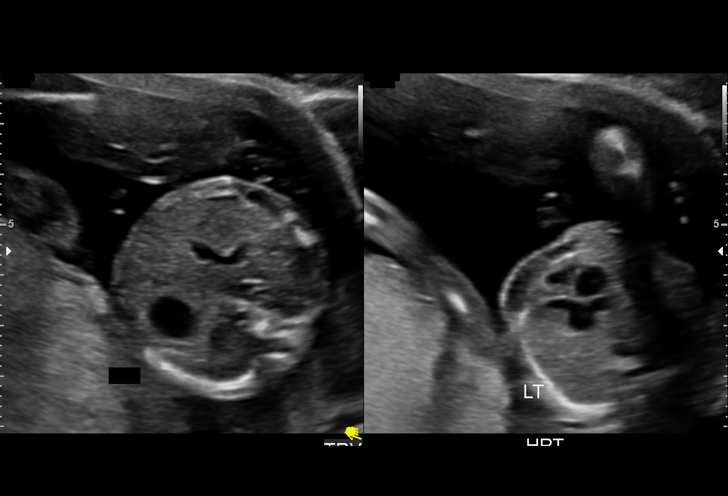
[im 46/82]
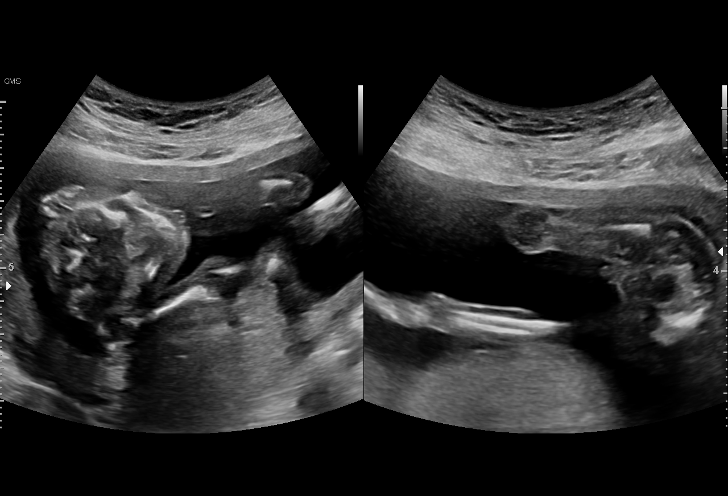
[im 52/82]
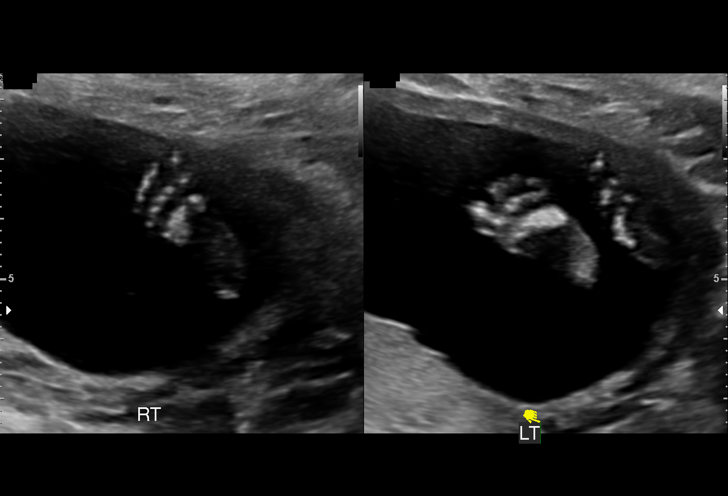
[im 58/82]
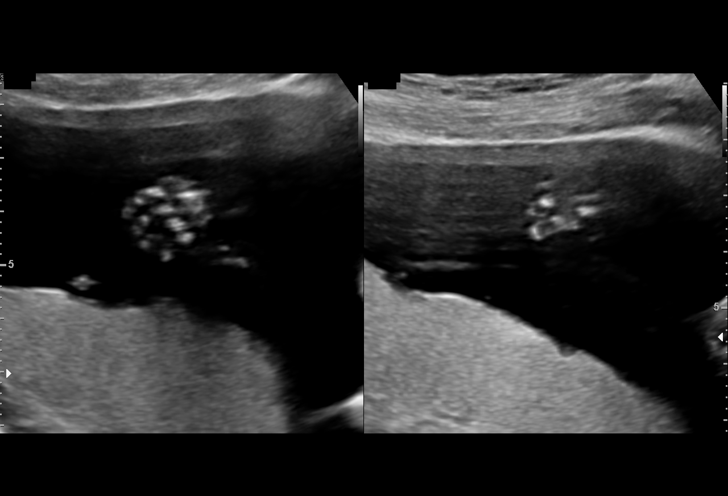
[im 64/82]
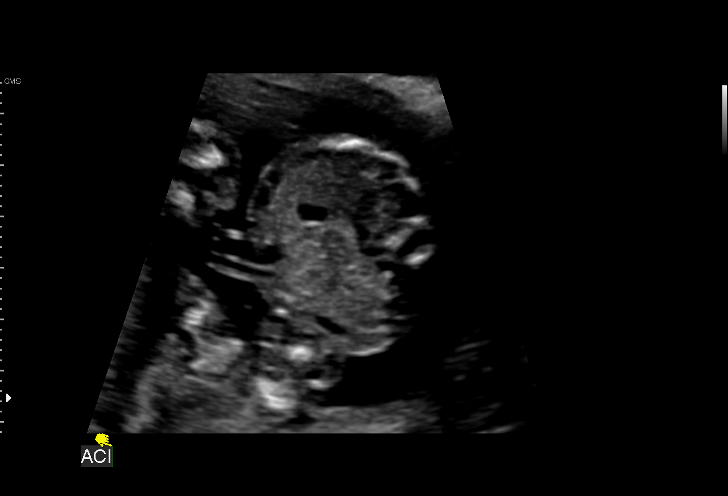
[im 70/82]
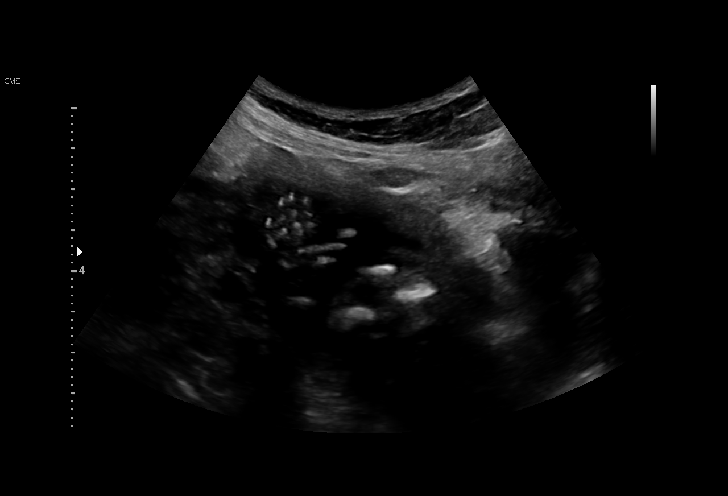
[im 76/82]
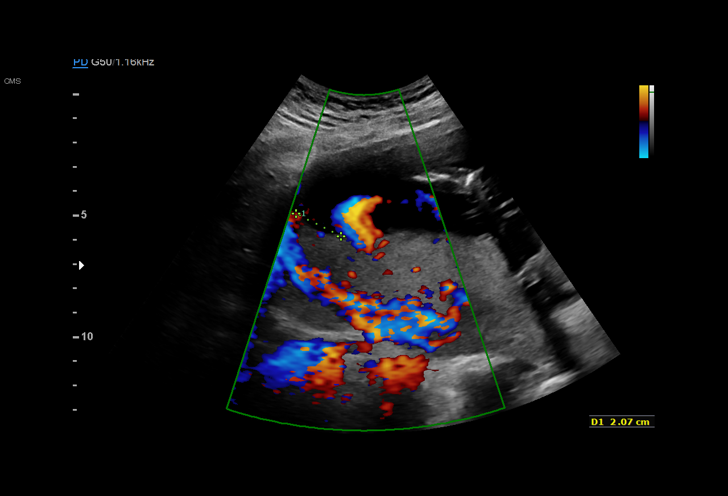
[im 82/82]
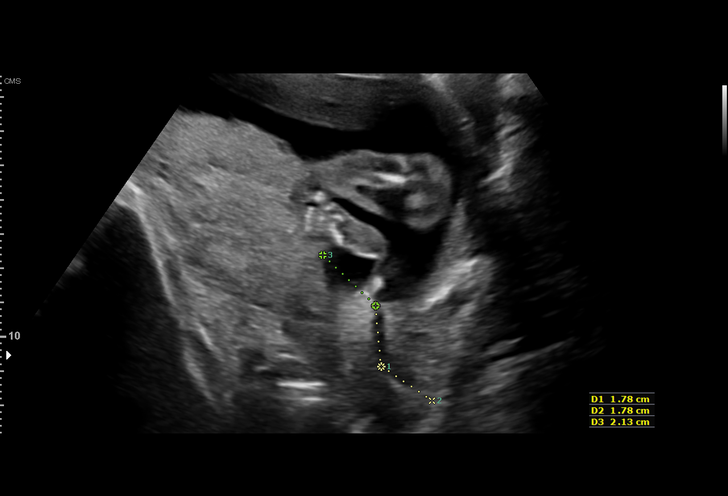

[14 of 28 positions shown; findings below may reference images not displayed]

for [REDACTED]care

1  VEDANT MEDDERS          [id number]      [id number]     [id number]
Indications

Encounter for antenatal screening for
malformations
18 weeks gestation of pregnancy
Cystic Fibrosis (CF) Carrier, second trimester
History of cesarean delivery, currently
pregnant
OB History

Gravidity:    2         Term:   1        Prem:   0        SAB:   0
TOP:          0       Ectopic:  0        Living: 1
Fetal Evaluation

Num Of Fetuses:     1
Fetal Heart         155
Rate(bpm):
Cardiac Activity:   Observed
Presentation:       Transverse, head to maternal right
Placenta:           Posterior
P. Cord Insertion:  Visualized

Amniotic Fluid
AFI FV:      Subjectively within normal limits
Biometry

BPD:      43.2  mm     G. Age:  19w 0d         77  %    CI:        71.34   %    70 - 86
FL/HC:      15.6   %    15.8 - 18
HC:      162.9  mm     G. Age:  19w 1d         73  %    HC/AC:      1.19        1.07 -
AC:      137.1  mm     G. Age:  19w 1d         70  %    FL/BPD:     58.8   %
FL:       25.4  mm     G. Age:  17w 5d         20  %    FL/AC:      18.5   %    20 - 24
HUM:      26.2  mm     G. Age:  18w 1d         49  %
CER:      18.1  mm     G. Age:  18w 0d         36  %
CM:          4  mm

Est. FW:     245  gm      0 lb 9 oz     49  %
Gestational Age

LMP:           18w 3d        Date:  04/11/17                 EDD:   01/16/18
U/S Today:     18w 5d                                        EDD:   01/14/18
Best:          18w 3d     Det. By:  LMP  (04/11/17)          EDD:   01/16/18
Anatomy

Cranium:               Appears normal         Aortic Arch:            Appears normal
Cavum:                 Appears normal         Ductal Arch:            Not well visualized
Ventricles:            Appears normal         Diaphragm:              Appears normal
Choroid Plexus:        Appears normal         Stomach:                Appears normal, left
sided
Cerebellum:            Appears normal         Abdomen:                Appears normal
Posterior Fossa:       Appears normal         Abdominal Wall:         Appears nml (cord
insert, abd wall)
Nuchal Fold:           Appears normal         Cord Vessels:           Appears normal (3
vessel cord)
Face:                  Appears normal         Kidneys:                Appear normal
(orbits and profile)
Lips:                  Appears normal         Bladder:                Appears normal
Thoracic:              Appears normal         Spine:                  Not well visualized
Heart:                 Appears normal         Upper Extremities:      Appears normal
(4CH, axis, and
situs)
RVOT:                  Not well visualized    Lower Extremities:      Appears normal
LVOT:                  Appears normal

Other:  Heels and left 5th digit/open hand visualized.
Cervix Uterus Adnexa

Cervix
Length:            3.5  cm.
Normal appearance by transabdominal scan.

Uterus
No abnormality visualized.

Cul De Sac:   No free fluid seen.

Adnexa:       No abnormality visualized.
Impression

We performed fetal anatomy scan. No makers of
aneuploidies or fetal structural defects are seen. Fetal
biometry is consistent with her previously-established dates.
Amniotic fluid is normal and good fetal activity is seen.
Following structure(s) to be evaluated on her next visit:
-RVOT, ductal arch.
-Fetal spine.
Recommendations

An appointment was made for her to return in 4 weeks for
completion of fetal anatomy.

## 2019-03-07 IMAGING — US US MFM OB FOLLOW-UP
1 series · 14 of 28 positions shown · non-contrast
Comparison: none

[Series 1: us mfm ob follow-up · 39 acquisitions, 14 frames shown]
[im 2/39]
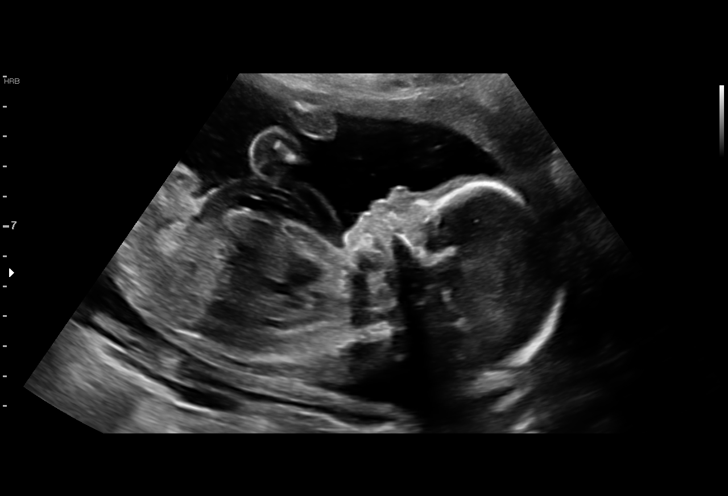
[im 5/39]
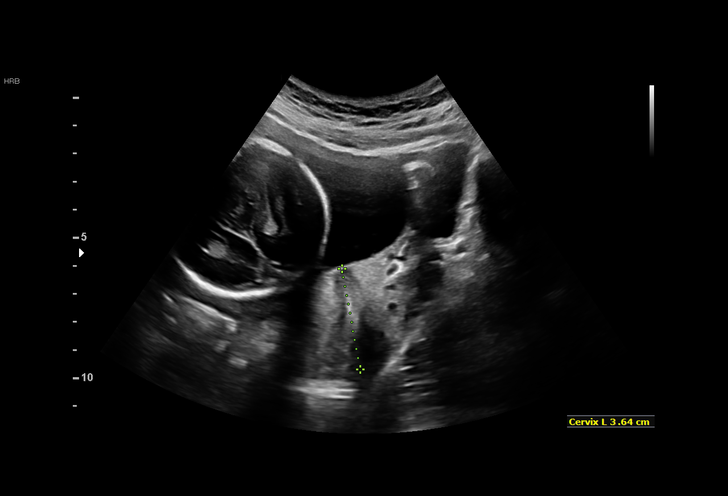
[im 8/39]
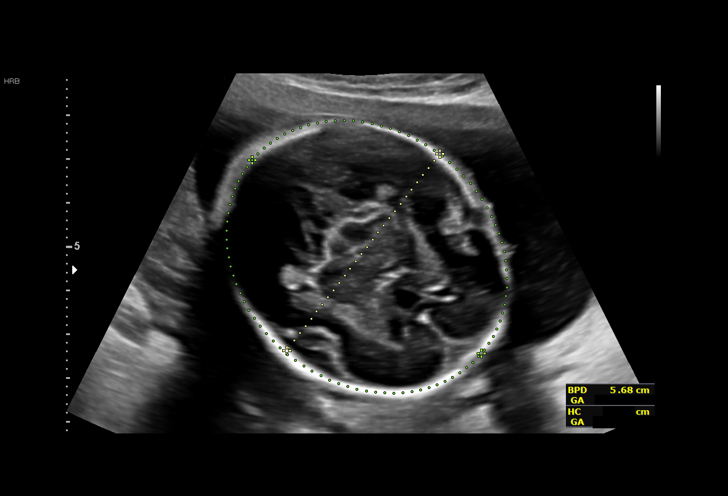
[im 10/39]
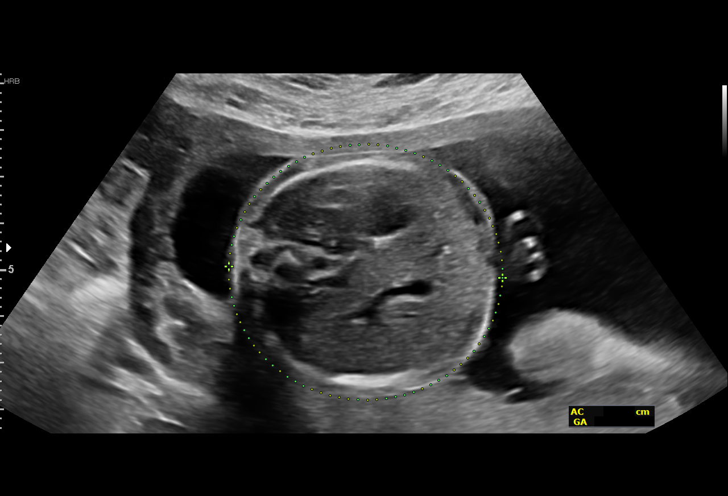
[im 13/39]
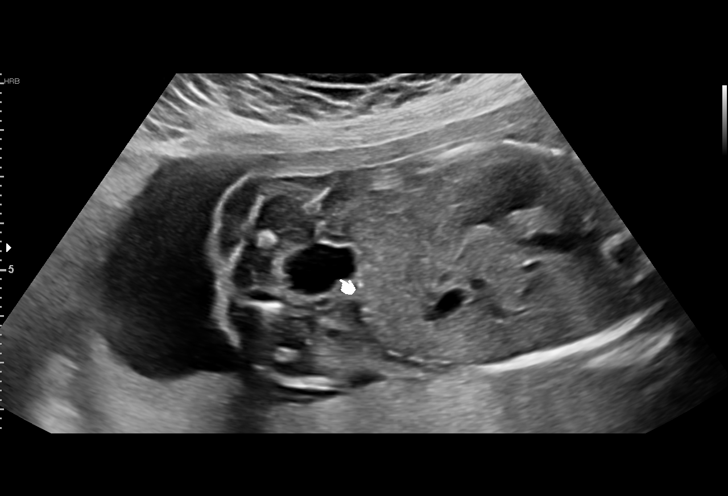
[im 16/39]
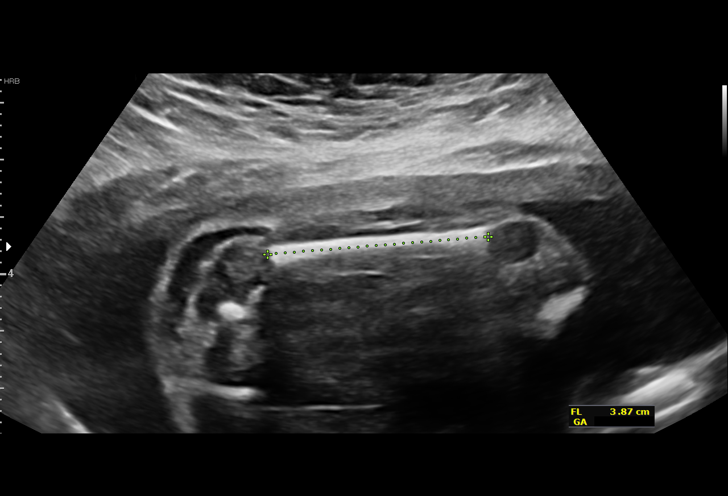
[im 19/39]
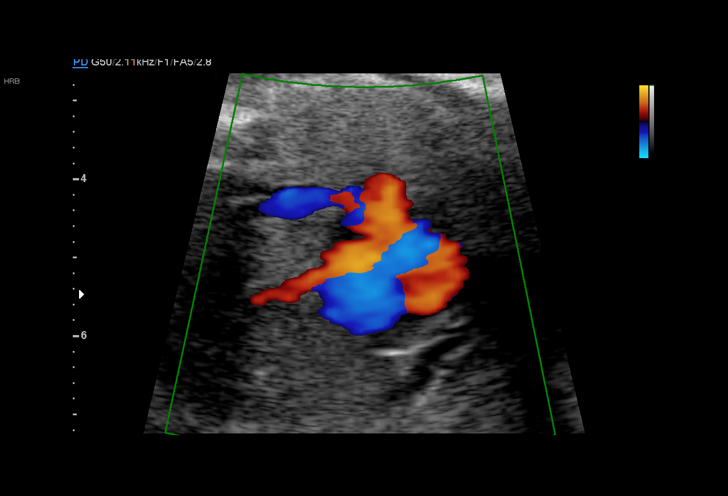
[im 22/39]
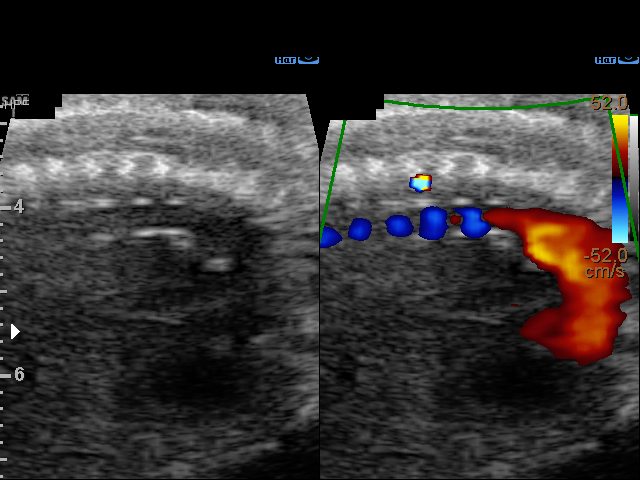
[im 24/39]
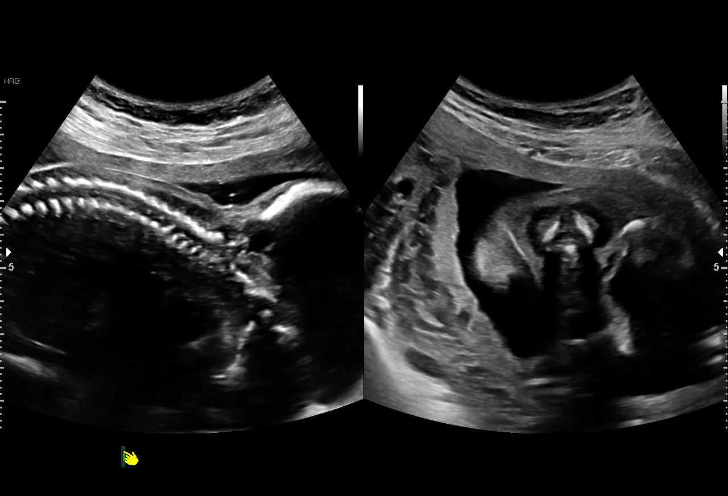
[im 27/39]
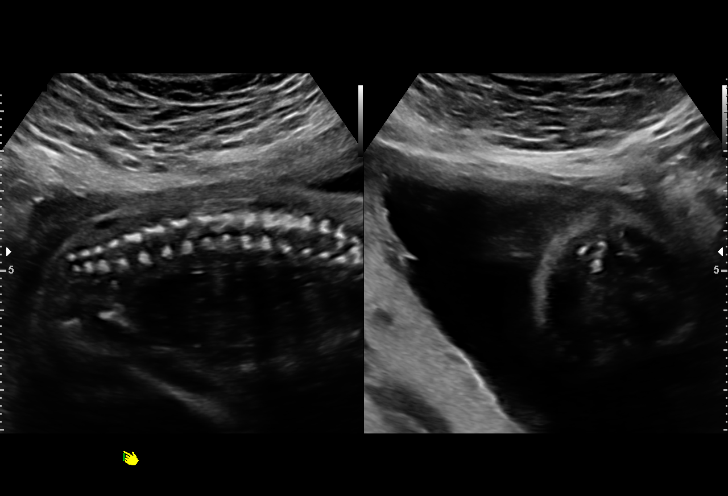
[im 30/39]
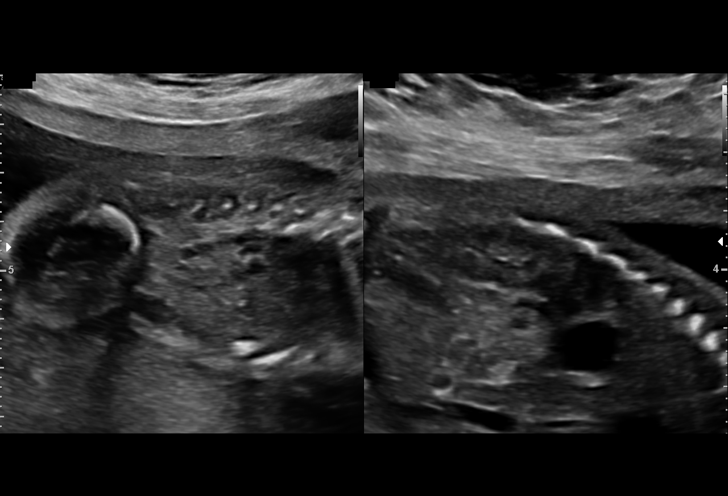
[im 33/39]
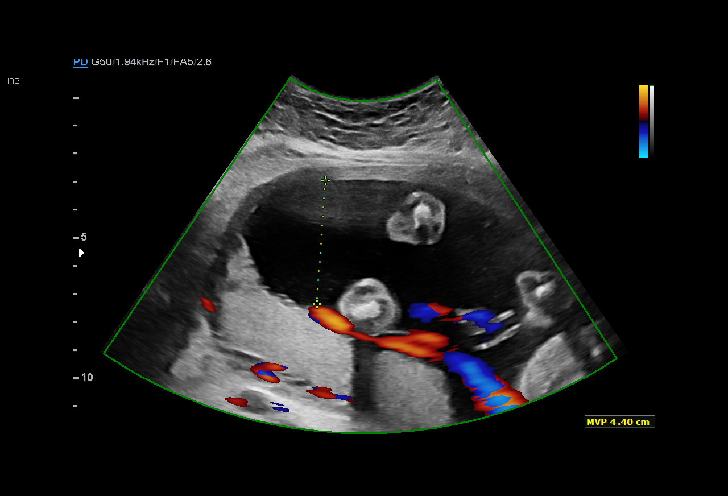
[im 36/39]
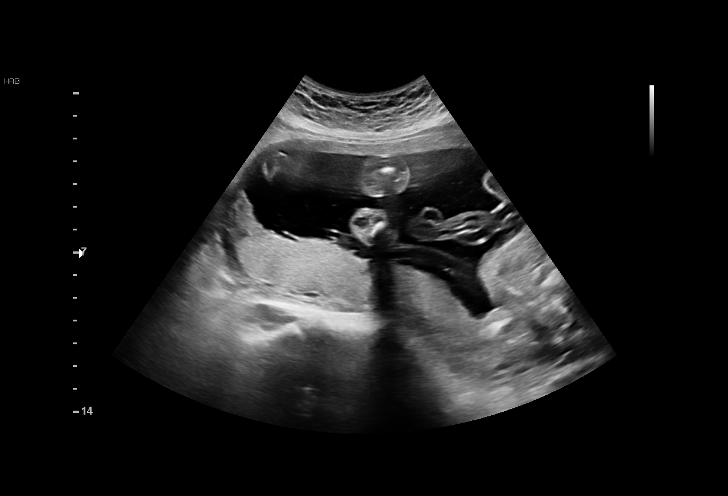
[im 39/39]
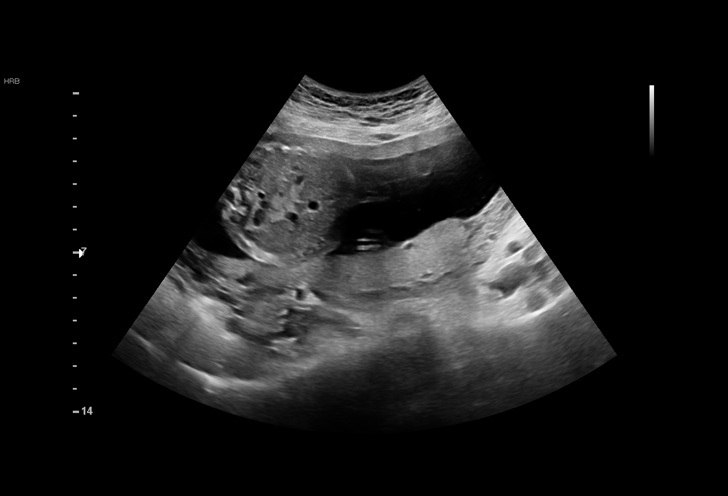

[14 of 28 positions shown; findings below may reference images not displayed]

for [REDACTED]care

Indications

Cystic Fibrosis (CF) Carrier, second trimester
22 weeks gestation of pregnancy
History of cesarean delivery, currently
pregnant
Antenatal follow-up for nonvisualized fetal
anatomy
Vital Signs

BMI:
Fetal Evaluation

Num Of Fetuses:         1
Cardiac Activity:       Observed
Presentation:           Cephalic
Placenta:               Posterior
P. Cord Insertion:      Previously Visualized

Amniotic Fluid
AFI FV:      Within normal limits

Largest Pocket(cm)
4.4
Biometry

BPD:      56.5  mm     G. Age:  23w 2d         77  %    CI:        80.94   %    70 - 86
FL/HC:      19.5   %    18.4 -
HC:      198.3  mm     G. Age:  22w 0d         22  %    HC/AC:      1.11        1.06 -
AC:      178.1  mm     G. Age:  22w 5d         51  %    FL/BPD:     68.5   %    71 - 87
FL:       38.7  mm     G. Age:  22w 3d         39  %    FL/AC:      21.7   %    20 - 24
HUM:      36.3  mm     G. Age:  22w 5d         50  %

Est. FW:     513  gm      1 lb 2 oz     52  %
OB History

Gravidity:    2         Term:   1        Prem:   0        SAB:   0
TOP:          0       Ectopic:  0        Living: 1
Gestational Age

LMP:           22w 3d        Date:  04/11/17                 EDD:   01/16/18
U/S Today:     22w 4d                                        EDD:   01/15/18
Best:          22w 3d     Det. By:  LMP  (04/11/17)          EDD:   01/16/18
Anatomy

Cranium:               Appears normal         Aortic Arch:            Previously seen
Cavum:                 Appears normal         Ductal Arch:            Appears normal
Ventricles:            Previously seen        Diaphragm:              Appears normal
Choroid Plexus:        Previously seen        Stomach:                Appears normal, left
sided
Cerebellum:            Previously seen        Abdomen:                Appears normal
Posterior Fossa:       Previously seen        Abdominal Wall:         Previously seen
Nuchal Fold:           Previously seen        Cord Vessels:           Appears normal (3
vessel cord)
Face:                  Orbits and profile     Kidneys:                Appear normal
previously seen
Lips:                  Previously seen        Bladder:                Appears normal
Thoracic:              Appears normal         Spine:                  Appears normal
Heart:                 Previously seen        Upper Extremities:      Previously seen
RVOT:                  Appears normal         Lower Extremities:      Previously seen
LVOT:                  Previously seen

Other:  Heels and 5th digit previously seen. Open hands visualized.
Cervix Uterus Adnexa

Cervix
Length:           3.64  cm.
Normal appearance by transabdominal scan.
Comments

U/S images were reviewed.  Appropriate interval growth is
noted.  No fetal abnormalities were identified
Recommendations: 1) Follow-up as clinically indicated
Recommendations

Follow-up as clinically indicated

## 2020-08-03 ENCOUNTER — Encounter: Payer: Self-pay | Admitting: Obstetrics and Gynecology

## 2020-08-03 ENCOUNTER — Ambulatory Visit (INDEPENDENT_AMBULATORY_CARE_PROVIDER_SITE_OTHER): Payer: Medicaid Other | Admitting: Obstetrics and Gynecology

## 2020-08-03 ENCOUNTER — Other Ambulatory Visit (HOSPITAL_COMMUNITY)
Admission: RE | Admit: 2020-08-03 | Discharge: 2020-08-03 | Disposition: A | Discharge status: Home / Self Care | Payer: Medicaid Other | Source: Ambulatory Visit | Attending: Obstetrics and Gynecology | Admitting: Obstetrics and Gynecology

## 2020-08-03 ENCOUNTER — Other Ambulatory Visit: Payer: Self-pay

## 2020-08-03 VITALS — BP 102/71 | HR 61 | Resp 16 | Ht 64.0 in | Wt 148.0 lb

## 2020-08-03 DIAGNOSIS — Z01419 Encounter for gynecological examination (general) (routine) without abnormal findings: Secondary | ICD-10-CM | POA: Insufficient documentation

## 2020-08-03 DIAGNOSIS — Z113 Encounter for screening for infections with a predominantly sexual mode of transmission: Secondary | ICD-10-CM | POA: Diagnosis present

## 2020-08-03 DIAGNOSIS — Z124 Encounter for screening for malignant neoplasm of cervix: Secondary | ICD-10-CM | POA: Insufficient documentation

## 2020-08-03 DIAGNOSIS — B9689 Other specified bacterial agents as the cause of diseases classified elsewhere: Secondary | ICD-10-CM | POA: Insufficient documentation

## 2020-08-03 DIAGNOSIS — N760 Acute vaginitis: Secondary | ICD-10-CM | POA: Insufficient documentation

## 2020-08-03 DIAGNOSIS — R102 Pelvic and perineal pain: Secondary | ICD-10-CM | POA: Insufficient documentation

## 2020-08-03 DIAGNOSIS — Z3009 Encounter for other general counseling and advice on contraception: Secondary | ICD-10-CM

## 2020-08-03 MED ORDER — NORETHINDRONE 0.35 MG PO TABS: 1.0000 | ORAL_TABLET | Freq: Every day | ORAL | 11 refills | Status: DC

## 2020-08-03 NOTE — Progress Notes (Addendum)
GYNECOLOGY ANNUAL PREVENTATIVE CARE ENCOUNTER NOTE  Subjective:   Sandra Harding is a 25 y.o. G22P2002 female here for a annual gynecologic exam. Current complaints: wants to restart birth control h/o partial, complex migraines with stroke-like symptoms. Also has had perineal pain since becoming sexually active with new partner last month, almost feels like a "tear" on perineum. Will feel better after sex but then gets very sore again as soon as she has intercourse again. No concerns about STI exposure. Periods normal.    Denies abnormal vaginal bleeding, discharge, pelvic pain, or other gynecologic concerns. Accepts STI screen.   Gynecologic History Patient's last menstrual period was 07/18/2020. Contraception: none currently, wants to start oral progesterone-only contraceptive Last Pap: 2019. Results: normal Last mammogram: n/a  Obstetric History OB History  Gravida Para Term Preterm AB Living  2 2 2     2   SAB IAB Ectopic Multiple Live Births        0 2    # Outcome Date GA Lbr Len/2nd Weight Sex Delivery Anes PTL Lv  2 Term 01/19/18 [redacted]w[redacted]d / 01:28 7 lb 8.8 oz (3.425 kg) M VBAC EPI  LIV  1 Term 09/27/13 [redacted]w[redacted]d  5 lb 10.7 oz (2.571 kg) F CS-LTranv Gen  LIV     Complications: Fetal Intolerance    Past Medical History:  Diagnosis Date   Headache    Medical history non-contributory     Past Surgical History:  Procedure Laterality Date   CESAREAN SECTION N/A 09/27/2013   Procedure: CESAREAN SECTION;  Surgeon: 11/27/2013, MD;  Location: [facility];  Service: Obstetrics;  Laterality: N/A;    Current Outpatient Medications on File Prior to Visit  Medication Sig Dispense Refill   Cyanocobalamin ([employer] B12 GUMMIES) 500 MCG CHEW Chew by mouth.     No current facility-administered medications on file prior to visit.    Allergies  Allergen Reactions   Sulfa Antibiotics Hives and Rash    Social History   Socioeconomic History   Marital status: Single    Spouse name: Not  on file   Number of children: 1   Years of education: Not on file   Highest education level: Not on file  Occupational History   Not on file  Tobacco Use   Smoking status: Former   Smokeless tobacco: Never  Vaping Use   Vaping Use: Never used  Substance and Sexual Activity   Alcohol use: No   Drug use: Not Currently    Types: Marijuana   Sexual activity: Yes    Birth control/protection: None  Other Topics Concern   Not on file  Social History Narrative   Not on file   Social Determinants of Health   Financial Resource Strain: Not on file  Food Insecurity: Not on file  Transportation Needs: Not on file  Physical Activity: Not on file  Stress: Not on file  Social Connections: Not on file  Intimate Partner Violence: Not on file    Family History  Problem Relation Age of Onset   Cancer Maternal Grandfather    Diabetes Paternal Aunt    Heart attack Maternal Grandmother    Alzheimer's disease Paternal Grandfather    Cancer Maternal Uncle     The following portions of the patient's history were reviewed and updated as appropriate: allergies, current medications, past family history, past medical history, past social history, past surgical history and problem list.  Review of Systems Pertinent items are noted in HPI.  Objective:  BP 102/71   Pulse 61   Resp 16   Ht 5\' 4"  (1.626 m)   Wt 148 lb (67.1 kg)   LMP 07/18/2020   BMI 25.40 kg/m  CONSTITUTIONAL: Well-developed, well-nourished female in no acute distress.  HENT:  Normocephalic, atraumatic, External right and left ear normal. Oropharynx is clear and moist EYES: Conjunctivae and EOM are normal. Pupils are equal, round, and reactive to light. No scleral icterus.  NECK: Normal range of motion, supple, no masses.  Normal thyroid.  SKIN: Skin is warm and dry. No rash noted. Not diaphoretic. No erythema. No pallor. NEUROLOGIC: Alert and oriented to person, place, and time. Normal reflexes, muscle tone  coordination. No cranial nerve deficit noted. PSYCHIATRIC: Normal mood and affect. Normal behavior. Normal judgment and thought content. CARDIOVASCULAR: Normal heart rate noted RESPIRATORY: Effort normal, no problems with respiration noted. BREASTS: Symmetric in size. No masses, skin changes, nipple drainage, or lymphadenopathy. ABDOMEN: Soft, no distention noted.  No tenderness, rebound or guarding.  PELVIC: Normal appearing external genitalia; microscopic laceration at perineum noted, in process of healing, not bleeding, normal appearing vaginal mucosa and cervix.  No abnormal discharge noted.  Pap smear obtained. Pelvic cultures obtained. Normal uterine size, no other palpable masses, no uterine or adnexal tenderness. MUSCULOSKELETAL: Normal range of motion. No tenderness.  No cyanosis, clubbing, or edema.  2+ distal pulses.  Exam done with chaperone present.  Assessment and Plan:   1. Well woman exam Healthy female exam  2. Routine screening for STI (sexually transmitted infection) - Hepatitis B surface antigen - Hepatitis C antibody - HIV Antibody (routine testing w rflx) - RPR - Cervicovaginal ancillary only  3. Perineal pain - likely 2/2 to abrasion at perineum, recommend abstain from intercourse x 1 week to let heal, use lube and change positions if pain ongoing - will check for infection - refer to PFPT if ongoing and no improvement - Cervicovaginal ancillary only  4. Cervical cancer screening - Cytology - PAP( [facility])  5. Encounter for counseling regarding contraception - Would like to restart contraception - given h/o gHTN and partial complex stroke, would not recommend OCPs - she is agreeable to progesterone only, reviewed need to take at same time daily, she verbalizes understanding - Rx sent to pharmacy  Will follow up results of pap smear/STI screen and manage accordingly. Encouraged improvement in diet and exercise.  Accepts STI screen. COVID vaccine   Mammogram n/a   Routine preventative health maintenance measures emphasized. Please refer to After Visit Summary for other counseling recommendations.    07/20/2020, MD, [facility] Attending Center for [facility])

## 2020-08-03 NOTE — Addendum Note (Signed)
Addended by: Leroy Libman on: 08/03/2020 10:46 AM   Modules accepted: Orders

## 2020-08-04 LAB — CERVICOVAGINAL ANCILLARY ONLY
Bacterial Vaginitis (gardnerella): POSITIVE — AB
Candida Glabrata: NEGATIVE
Candida Vaginitis: NEGATIVE
Chlamydia: NEGATIVE
Comment: NEGATIVE
Comment: NEGATIVE
Comment: NEGATIVE
Comment: NEGATIVE
Comment: NEGATIVE
Comment: NORMAL
Neisseria Gonorrhea: NEGATIVE
Trichomonas: NEGATIVE

## 2020-08-04 LAB — HIV ANTIBODY (ROUTINE TESTING W REFLEX): HIV 1&2 Ab, 4th Generation: NONREACTIVE

## 2020-08-04 LAB — HEPATITIS B SURFACE ANTIGEN: Hepatitis B Surface Ag: NONREACTIVE

## 2020-08-04 LAB — HEPATITIS C ANTIBODY
Hepatitis C Ab: NONREACTIVE
SIGNAL TO CUT-OFF: 0.02 (ref <1.00)

## 2020-08-04 LAB — RPR: RPR Ser Ql: NONREACTIVE

## 2020-08-08 LAB — CYTOLOGY - PAP
Diagnosis: NEGATIVE
Diagnosis: REACTIVE

## 2020-08-09 ENCOUNTER — Other Ambulatory Visit: Payer: Self-pay | Admitting: Family Medicine

## 2020-08-09 MED ORDER — METRONIDAZOLE 500 MG PO TABS: 500.0000 mg | ORAL_TABLET | Freq: Two times a day (BID) | ORAL | 0 refills | Status: DC

## 2021-10-17 ENCOUNTER — Encounter: Payer: Self-pay | Admitting: *Deleted

## 2021-10-23 ENCOUNTER — Encounter: Payer: Self-pay | Admitting: Obstetrics & Gynecology

## 2021-10-23 DIAGNOSIS — Z3490 Encounter for supervision of normal pregnancy, unspecified, unspecified trimester: Secondary | ICD-10-CM | POA: Insufficient documentation

## 2021-10-23 HISTORY — DX: Encounter for supervision of normal pregnancy, unspecified, unspecified trimester: Z34.90

## 2021-10-23 NOTE — Progress Notes (Unsigned)
Last pap 7/22

## 2021-10-24 ENCOUNTER — Other Ambulatory Visit (HOSPITAL_COMMUNITY)
Admission: RE | Admit: 2021-10-24 | Discharge: 2021-10-24 | Disposition: A | Discharge status: Home / Self Care | Payer: Medicaid Other | Source: Ambulatory Visit | Attending: Obstetrics & Gynecology | Admitting: Obstetrics & Gynecology

## 2021-10-24 ENCOUNTER — Ambulatory Visit (INDEPENDENT_AMBULATORY_CARE_PROVIDER_SITE_OTHER): Payer: Medicaid Other | Admitting: Obstetrics & Gynecology

## 2021-10-24 ENCOUNTER — Encounter: Payer: Self-pay | Admitting: Obstetrics & Gynecology

## 2021-10-24 ENCOUNTER — Encounter: Payer: Self-pay | Admitting: *Deleted

## 2021-10-24 ENCOUNTER — Ambulatory Visit: Payer: Self-pay

## 2021-10-24 VITALS — BP 130/84 | HR 64 | Wt 147.0 lb

## 2021-10-24 DIAGNOSIS — Z3490 Encounter for supervision of normal pregnancy, unspecified, unspecified trimester: Secondary | ICD-10-CM

## 2021-10-24 DIAGNOSIS — Z3A10 10 weeks gestation of pregnancy: Secondary | ICD-10-CM

## 2021-10-24 DIAGNOSIS — Z23 Encounter for immunization: Secondary | ICD-10-CM | POA: Diagnosis not present

## 2021-10-24 DIAGNOSIS — Z141 Cystic fibrosis carrier: Secondary | ICD-10-CM | POA: Diagnosis not present

## 2021-10-24 DIAGNOSIS — O09891 Supervision of other high risk pregnancies, first trimester: Secondary | ICD-10-CM

## 2021-10-24 DIAGNOSIS — O09899 Supervision of other high risk pregnancies, unspecified trimester: Secondary | ICD-10-CM

## 2021-10-24 MED ORDER — POLYETHYLENE GLYCOL 3350 17 GM/SCOOP PO POWD: 17.0000 g | Freq: Every day | ORAL | 0 refills | Status: DC

## 2021-10-24 MED ORDER — ONDANSETRON HCL 4 MG PO TABS: 4.0000 mg | ORAL_TABLET | Freq: Three times a day (TID) | ORAL | 1 refills | Status: DC | PRN

## 2021-10-24 MED ORDER — PROMETHAZINE HCL 25 MG PO TABS: 25.0000 mg | ORAL_TABLET | Freq: Four times a day (QID) | ORAL | 2 refills | Status: DC | PRN

## 2021-10-24 MED ORDER — ASPIRIN 81 MG PO TBEC: 81.0000 mg | DELAYED_RELEASE_TABLET | Freq: Every day | ORAL | 12 refills | Status: DC

## 2021-10-24 NOTE — Progress Notes (Signed)
Subjective:    Sandra Harding is a [id number] [redacted]w[redacted]d being seen today for her first obstetrical visit.  Her obstetrical history is significant for pregnancy induced hypertension. Patient does intend to breast feed. Pregnancy history fully reviewed.  Patient was told she has a patent foramen ovale.  I do not see a echo in care everywhere or in [facility].  Patient also having nausea and vomiting.  She has been taking her daughter Sandra Harding.  She is lost approximately 15 pounds on patient report.  Patient reports nausea and vomiting.  Vitals:   10/24/21 0853  BP: 130/84  Pulse: 64  Weight: 147 lb (66.7 kg)    HISTORY: OB History  Gravida Para Term Preterm AB Living  3 2 2     2   SAB IAB Ectopic Multiple Live Births        0 2    # Outcome Date GA Lbr Len/2nd Weight Sex Delivery Anes PTL Lv  3 Current           2 Term 01/19/18 [redacted]w[redacted]d / 01:28 7 lb 8.8 oz (3.425 kg) M VBAC EPI  LIV  1 Term 09/27/13 [redacted]w[redacted]d  5 lb 10.7 oz (2.571 kg) F CS-LTranv Gen  LIV     Complications: Fetal Intolerance   Past Medical History:  Diagnosis Date   Headache    Medical history non-contributory    PFO (patent foramen ovale)    Past Surgical History:  Procedure Laterality Date   CESAREAN SECTION N/A 09/27/2013   Procedure: CESAREAN SECTION;  Surgeon: Guss Bunde, MD;  Location: [facility];  Service: Obstetrics;  Laterality: N/A;   Family History  Problem Relation Age of Onset   Cancer Maternal Grandfather    Diabetes Paternal Aunt    Heart attack Maternal Grandmother    Alzheimer's disease Paternal Grandfather    Cancer Maternal Uncle      Exam    Uterus:     Pelvic Exam:    Perineum: No Hemorrhoids   Vulva: normal   Vagina:  normal mucosa, normal discharge   pH: N/a   Cervix: Glandular / mass--biopsy today   Adnexa: Non tender   Bony Pelvis: average  System: Breast:  normal appearance, no masses or tenderness   Skin: normal coloration and turgor, no rashes    Neurologic: oriented, normal mood    Extremities: normal strength, tone, and muscle mass   HEENT sclera clear, anicteric, oropharynx clear, no lesions, neck supple with midline trachea, thyroid without masses, and trachea midline   Mouth/Teeth mucous membranes moist, pharynx normal without lesions and dental hygiene good   Neck supple and no masses   Cardiovascular: regular rate and rhythm   Respiratory:  appears well, vitals normal, no respiratory distress, acyanotic, normal RR, neck free of mass or lymphadenopathy, chest clear, no wheezing, crepitations, rhonchi, normal symmetric air entry   Abdomen: soft, non-tender; bowel sounds normal; no masses,  no organomegaly   Urinary: urethral meatus normal      Assessment:    Pregnancy: [id number] Patient Active Problem List   Diagnosis Date Noted   Supervision of normal pregnancy 10/23/2021   Encounter for routine checking of intrauterine contraceptive device (IUD) 04/01/2018   History of gestational hypertension 01/19/2018   Cystic fibrosis gene carrier 07/05/2017   History of migraine 06/27/2017        Plan:  Initial labs drawn. Prenatal vitamins. Problem list reviewed and updated. Genetic Screening discussed -- panorama; pt already known to be a CF carrier;  FOB is getting tested for CF at the [facility] (new father/partner) Ultrasound discussed; fetal survey:  18-20 weeks . PFO on patient report--Pt had TIA like symptoms while driving and was seen in [facility].  Negative stroke work up; She has a cardiology and neurology appts in [place] next week for further work up.  Hx of gestational HTN--baby ASA at 13 weeks. Hx of 3rd degree laceration with VBAC--will discuss risk of 3rd/4th degree with next vaginal delivery. Hyperemesis--Sandra Harding/phenergan; miralax for constipation.  Follow up after the cardiology/neuro to coordinate care.    Sandra Harding 10/24/2021

## 2021-10-24 NOTE — Progress Notes (Signed)
Pt is carrier for CF- FOB is getting tested at the [place] and will send [place] results

## 2021-10-25 ENCOUNTER — Other Ambulatory Visit: Payer: Self-pay | Admitting: Obstetrics & Gynecology

## 2021-10-25 LAB — CERVICOVAGINAL ANCILLARY ONLY
Bacterial Vaginitis (gardnerella): POSITIVE — AB
Candida Glabrata: NEGATIVE
Candida Vaginitis: NEGATIVE
Chlamydia: NEGATIVE
Comment: NEGATIVE
Comment: NEGATIVE
Comment: NEGATIVE
Comment: NEGATIVE
Comment: NEGATIVE
Comment: NORMAL
Neisseria Gonorrhea: NEGATIVE
Trichomonas: NEGATIVE

## 2021-10-25 LAB — GC/CHLAMYDIA PROBE AMP (~~LOC~~) NOT AT ARMC
Chlamydia: NEGATIVE
Comment: NEGATIVE
Comment: NORMAL
Neisseria Gonorrhea: NEGATIVE

## 2021-10-25 MED ORDER — METRONIDAZOLE 500 MG PO TABS: 500.0000 mg | ORAL_TABLET | Freq: Two times a day (BID) | ORAL | 0 refills | Status: DC

## 2021-10-26 LAB — CULTURE, OB URINE

## 2021-10-26 LAB — URINE CULTURE, OB REFLEX

## 2021-10-26 LAB — SURGICAL PATHOLOGY

## 2021-10-30 ENCOUNTER — Encounter (HOSPITAL_COMMUNITY): Payer: Self-pay | Admitting: Obstetrics and Gynecology

## 2021-10-30 ENCOUNTER — Inpatient Hospital Stay (HOSPITAL_COMMUNITY)
Admission: AD | Admit: 2021-10-30 | Discharge: 2021-10-30 | Disposition: A | Discharge status: Home / Self Care | Payer: Medicaid Other | Attending: Obstetrics and Gynecology | Admitting: Obstetrics and Gynecology

## 2021-10-30 ENCOUNTER — Other Ambulatory Visit: Payer: Self-pay

## 2021-10-30 DIAGNOSIS — O26891 Other specified pregnancy related conditions, first trimester: Secondary | ICD-10-CM | POA: Insufficient documentation

## 2021-10-30 DIAGNOSIS — O209 Hemorrhage in early pregnancy, unspecified: Secondary | ICD-10-CM | POA: Diagnosis not present

## 2021-10-30 DIAGNOSIS — Z6710 Type A blood, Rh positive: Secondary | ICD-10-CM | POA: Diagnosis not present

## 2021-10-30 DIAGNOSIS — Z3A10 10 weeks gestation of pregnancy: Secondary | ICD-10-CM | POA: Insufficient documentation

## 2021-10-30 DIAGNOSIS — Z3490 Encounter for supervision of normal pregnancy, unspecified, unspecified trimester: Secondary | ICD-10-CM

## 2021-10-30 DIAGNOSIS — Z3491 Encounter for supervision of normal pregnancy, unspecified, first trimester: Secondary | ICD-10-CM

## 2021-10-30 DIAGNOSIS — R519 Headache, unspecified: Secondary | ICD-10-CM

## 2021-10-30 LAB — URINALYSIS, ROUTINE W REFLEX MICROSCOPIC
Bacteria, UA: NONE SEEN
Bilirubin Urine: NEGATIVE
Glucose, UA: NEGATIVE mg/dL
Ketones, ur: NEGATIVE mg/dL
Leukocytes,Ua: NEGATIVE
Nitrite: NEGATIVE
Protein, ur: NEGATIVE mg/dL
Specific Gravity, Urine: 1.027 (ref 1.005–1.030)
pH: 5 (ref 5.0–8.0)

## 2021-10-30 LAB — CBC
HCT: 32.2 % — ABNORMAL LOW (ref 36.0–46.0)
Hemoglobin: 11.5 g/dL — ABNORMAL LOW (ref 12.0–15.0)
MCH: 31.3 pg (ref 26.0–34.0)
MCHC: 35.7 g/dL (ref 30.0–36.0)
MCV: 87.5 fL (ref 80.0–100.0)
Platelets: 270 10*3/uL (ref 150–400)
RBC: 3.68 MIL/uL — ABNORMAL LOW (ref 3.87–5.11)
RDW: 11.2 % — ABNORMAL LOW (ref 11.5–15.5)
WBC: 6.8 10*3/uL (ref 4.0–10.5)
nRBC: 0 % (ref 0.0–0.2)

## 2021-10-30 MED ORDER — ACETAMINOPHEN-CAFFEINE 500-65 MG PO TABS
2.0000 | ORAL_TABLET | Freq: Once | ORAL | Status: AC
  Administered 2021-10-30: 2 via ORAL
  Filled 2021-10-30: qty 2

## 2021-10-30 NOTE — Discharge Instructions (Signed)

## 2021-10-30 NOTE — MAU Note (Signed)
Sandra Harding is a 26 y.o. at [redacted]w[redacted]d here in MAU reporting: she has having bright red VB that began today @ 1630 this afternoon.  Reports she used the restroom and bled through her thong onto her clothing.  Last intercourse 3 weeks ago. Reports also has a H/A, took Excedrin @ 1100 this morning.  Reports has a long Hx of migraine H/A's. LMP: NA Onset of complaint: today Pain score: 0 Vitals:   10/30/21 1834  BP: 113/74  Pulse: 72  Resp: 20  Temp: 98.2 F (36.8 C)  SpO2: 100%     FHT:NA Lab orders placed from triage:   UA

## 2021-10-30 NOTE — MAU Provider Note (Signed)
History     CSN: [id number]  Arrival date and time: 10/30/21 1815   Event Date/Time   First Provider Initiated Contact with Patient 10/30/21 1857      Chief Complaint  Patient presents with   Vaginal Bleeding   HPI Sandra Harding is a 26 y.o. [id number] at [redacted]w[redacted]d who presents to MAU with chief complaint of vaginal bleeding. This is a new problem, onset at 1630 hours today. Patient states "I bled through my thong".  She is uncertain about ongoing bleeding. No previous episodes of bleeding this pregnancy. She denies abdominal pain. She is remote from sexual intercourse. She is not experiencing weakness, dizziness, or syncope.   Patient also reports ongoing headache 2/2 8-year history of headaches. Pain score is 6/10. She is s/p outpatient Neurology evaluation earlier today. She has attempted management with Excedrin Migraine, last taken at 1100 hours. She states her Neurologist encouraged her to discontinue use of Excedrin Migraine due to her pregnancy.  She requests discharge from MAU once her CBC is collected and her headache medicine is given. She is not requesting ongoing management of her headache during her MAU encounter.    Patient has established care with [facility].   OB History     Gravida  3   Para  2   Term  2   Preterm      AB      Living  2      SAB      IAB      Ectopic      Multiple  0   Live Births  2           Past Medical History:  Diagnosis Date   Headache    Medical history non-contributory    PFO (patent foramen ovale)     Past Surgical History:  Procedure Laterality Date   CESAREAN SECTION N/A 09/27/2013   Procedure: CESAREAN SECTION;  Surgeon: Lesly Dukes, MD;  Location: [facility];  Service: Obstetrics;  Laterality: N/A;    Family History  Problem Relation Age of Onset   Cancer Maternal Grandfather    Diabetes Paternal Aunt    Heart attack Maternal Grandmother    Alzheimer's disease Paternal Grandfather    Cancer Maternal Uncle      Social History   Tobacco Use   Smoking status: Former   Smokeless tobacco: Never  Building services engineer Use: Never used  Substance Use Topics   Alcohol use: No   Drug use: Not Currently    Types: Marijuana    Allergies:  Allergies  Allergen Reactions   Sulfa Antibiotics Hives and Rash    Medications Prior to Admission  Medication Sig Dispense Refill Last Dose   aspirin EC 81 MG tablet Take 1 tablet (81 mg total) by mouth daily. Swallow whole. 30 tablet 12 10/29/2021   Cyanocobalamin ([employer] B12 GUMMIES) 500 MCG CHEW Chew by mouth.   10/30/2021   ondansetron (ZOFRAN) 4 MG tablet Take 1 tablet (4 mg total) by mouth every 8 (eight) hours as needed for nausea or vomiting. 30 tablet 1 10/29/2021   Prenatal 27-1 MG TABS Take 1 tablet by mouth daily.   10/30/2021   promethazine (PHENERGAN) 25 MG tablet Take 1 tablet (25 mg total) by mouth every 6 (six) hours as needed for nausea or vomiting. 30 tablet 2 10/29/2021   metroNIDAZOLE (FLAGYL) 500 MG tablet Take 1 tablet (500 mg total) by mouth 2 (two) times daily with a meal.  14 tablet 0    polyethylene glycol powder (MIRALAX) 17 GM/SCOOP powder Take 17 g by mouth daily. 255 g 0     Review of Systems  Genitourinary:  Positive for vaginal bleeding.  Neurological:  Positive for headaches.  All other systems reviewed and are negative.  Physical Exam   Blood pressure 114/75, pulse 62, temperature 98.2 F (36.8 C), temperature source Oral, resp. rate 20, height 5\' 2"  (1.575 m), weight 67.3 kg, last menstrual period 08/15/2021, SpO2 100 %.  Physical Exam Vitals and nursing note reviewed. Exam conducted with a chaperone present.  Skin:    Capillary Refill: Capillary refill takes less than 2 seconds.  Neurological:     Mental Status: She is oriented to person, place, and time. Mental status is at baseline.     Motor: Motor function is intact.     Coordination: Coordination is intact.     Gait: Gait is intact.  Psychiatric:        Mood  and Affect: Mood normal.        Behavior: Behavior normal.        Thought Content: Thought content normal.        Judgment: Judgment normal.    MAU Course  Procedures  MDM --Live IUP with very active movement visualized on bedside ultrasound. FHR 160. --Given reassuring bedside ultrasound, normal pulse, no visible bleeding on pad or bed in MAU, decision made to defer pelvic exam. Patient not currently sexually active. Advised intentional pelvic rest until cleared by OB. Reviewed bleeding precautions, indications for return to MAU. Encouraged patient to wear full coverage underwear, donn regular max pad to assist with bleeding assessment --Patient with 8-year history of headaches accompanied by paraesthesias. S/p normal Evaluated by outpatient Neurology earlier today. Note visible in Encounters tab and Care Everywhere. Visit encounter with primary diagnosis of Transient Neurological Symptoms. S/p normal MRI 10/17/2021. No indication to repeat Neuro evaluation within hours of outpatient formal Neurology appointment. Patient in agreement. --Notified by 10/19/2021, RN that patient requests discharge once CBC is collected and medication is given. --Will place discharge order once CBC is marked as "in process" by lab.   Patient Vitals for the past 24 hrs:  BP Temp Temp src Pulse Resp SpO2 Height Weight  10/30/21 1953 111/66 -- -- 66 -- -- -- --  10/30/21 1854 114/75 -- -- 62 -- -- -- --  10/30/21 1834 113/74 98.2 F (36.8 C) Oral 72 20 100 % -- --  10/30/21 1828 -- -- -- -- -- -- 5\' 2"  (1.575 m) 67.3 kg   Results for orders placed or performed during the hospital encounter of 10/30/21 (from the past 24 hour(s))  Urinalysis, Routine w reflex microscopic Urine, Clean Catch     Status: Abnormal   Collection Time: 10/30/21  6:40 PM  Result Value Ref Range   Color, Urine YELLOW YELLOW   APPearance HAZY (A) CLEAR   Specific Gravity, Urine 1.027 1.005 - 1.030   pH 5.0 5.0 - 8.0   Glucose, UA NEGATIVE  NEGATIVE mg/dL   Hgb urine dipstick MODERATE (A) NEGATIVE   Bilirubin Urine NEGATIVE NEGATIVE   Ketones, ur NEGATIVE NEGATIVE mg/dL   Protein, ur NEGATIVE NEGATIVE mg/dL   Nitrite NEGATIVE NEGATIVE   Leukocytes,Ua NEGATIVE NEGATIVE   RBC / HPF 0-5 0 - 5 RBC/hpf   WBC, UA 0-5 0 - 5 WBC/hpf   Bacteria, UA NONE SEEN NONE SEEN   Squamous Epithelial / LPF 6-10 0 - 5  Mucus PRESENT   CBC     Status: Abnormal   Collection Time: 10/30/21  7:14 PM  Result Value Ref Range   WBC 6.8 4.0 - 10.5 K/uL   RBC 3.68 (L) 3.87 - 5.11 MIL/uL   Hemoglobin 11.5 (L) 12.0 - 15.0 g/dL   HCT 32.2 (L) 36.0 - 46.0 %   MCV 87.5 80.0 - 100.0 fL   MCH 31.3 26.0 - 34.0 pg   MCHC 35.7 30.0 - 36.0 g/dL   RDW 11.2 (L) 11.5 - 15.5 %   Platelets 270 150 - 400 K/uL   nRBC 0.0 0.0 - 0.2 %    Assessment and Plan  --26 y.o. G3P2002 at [redacted]w[redacted]d  --FHT 160 via BSUS --Episode of bleeding, uncertain etiology --Hgb 11.5 --Blood type A POS --Pelvic rest advised, bleeding precautions reviewed --S/p outpatient Neuro evaluation earlier today --Discharge home in stable condition per patient request  F/U: --Patient has outpatient Cardiology evaluation tomorrow 10/31/2021 --Next OB appointment at [place] is 11/12/2021  Darlina Rumpf, [place], MSN, CNM 10/31/2021, 10:21 AM

## 2021-11-07 LAB — PANORAMA PRENATAL TEST FULL PANEL:PANORAMA TEST PLUS 5 ADDITIONAL MICRODELETIONS: FETAL FRACTION: 10

## 2021-11-12 ENCOUNTER — Ambulatory Visit (INDEPENDENT_AMBULATORY_CARE_PROVIDER_SITE_OTHER): Payer: Self-pay | Admitting: Obstetrics & Gynecology

## 2021-11-12 VITALS — BP 111/74 | HR 75 | Wt 143.0 lb

## 2021-11-12 DIAGNOSIS — Z3490 Encounter for supervision of normal pregnancy, unspecified, unspecified trimester: Secondary | ICD-10-CM

## 2021-11-12 DIAGNOSIS — Z3A12 12 weeks gestation of pregnancy: Secondary | ICD-10-CM

## 2021-11-12 DIAGNOSIS — Z8759 Personal history of other complications of pregnancy, childbirth and the puerperium: Secondary | ICD-10-CM

## 2021-11-12 DIAGNOSIS — Z3491 Encounter for supervision of normal pregnancy, unspecified, first trimester: Secondary | ICD-10-CM

## 2021-11-12 DIAGNOSIS — G43909 Migraine, unspecified, not intractable, without status migrainosus: Secondary | ICD-10-CM

## 2021-11-12 LAB — POCT URINALYSIS DIPSTICK
Bilirubin, UA: NEGATIVE
Blood, UA: NEGATIVE
Glucose, UA: NEGATIVE
Ketones, UA: NEGATIVE
Leukocytes, UA: NEGATIVE
Nitrite, UA: NEGATIVE
Protein, UA: NEGATIVE
Spec Grav, UA: 1.015 (ref 1.010–1.025)
Urobilinogen, UA: 0.2 E.U./dL
pH, UA: 5 (ref 5.0–8.0)

## 2021-11-12 MED ORDER — METRONIDAZOLE 0.75 % VA GEL: 1.0000 | Freq: Every day | VAGINAL | 1 refills | Status: DC

## 2021-11-12 MED ORDER — DOXYLAMINE-PYRIDOXINE 10-10 MG PO TBEC: 2.0000 | DELAYED_RELEASE_TABLET | Freq: Every day | ORAL | 2 refills | Status: DC

## 2021-11-12 NOTE — Progress Notes (Signed)
   PRENATAL VISIT NOTE  Subjective:  Sandra Harding is a 26 y.o. G3P2002 at [redacted]w[redacted]d being seen today for ongoing prenatal care.  She is currently monitored for the following issues for this high-risk pregnancy and has History of migraine; Cystic fibrosis gene carrier; History of gestational hypertension; and Supervision of normal pregnancy on their problem list.  Patient reports fatigue, nausea, and vomiting.  Contractions: Not present. Vag. Bleeding: None.  Movement: Absent. Denies leaking of fluid.   The following portions of the patient's history were reviewed and updated as appropriate: allergies, current medications, past family history, past medical history, past social history, past surgical history and problem list.   Objective:   Vitals:   11/12/21 1045  BP: 111/74  Pulse: 75  Weight: 143 lb (64.9 kg)    Fetal Status: Fetal Heart Rate (bpm): 156   Movement: Absent     General:  Alert, oriented and cooperative. Patient is in no acute distress.  Skin: Skin is warm and dry. No rash noted.   Cardiovascular: Normal heart rate noted  Respiratory: Normal respiratory effort, no problems with respiration noted  Abdomen: Soft, gravid, appropriate for gestational age.  Pain/Pressure: Absent     Pelvic: Cervical exam deferred        Extremities: Normal range of motion.  Edema: None  Mental Status: Normal mood and affect. Normal behavior. Normal judgment and thought content.   Assessment and Plan:  Pregnancy: G3P2002 at [redacted]w[redacted]d 1. Hyperemesis Pt taking phenergan at night--can't take during the day due to drowsiness Pt was taking zofran--had to back down due to constipation Will try diglecis and phenergan Dip UA negative for ketones and nml specific gravity.  2. History of gestational hypertension Daily 81 mg ASA  3.  TIA/PFO Saw a neuro who decided she was low risk and no other testing necessary at this time and suggested riboflavin for migraine prophylaxis. ([facility]) Saw  cards--getting bubble echo and seeing structural heart surgeon ([facility]) Will follow  4.  Genetics Panorama nml AFP next visit  Preterm labor symptoms and general obstetric precautions including but not limited to vaginal bleeding, contractions, leaking of fluid and fetal movement were reviewed in detail with the patient. Please refer to After Visit Summary for other counseling recommendations.   No follow-ups on file.  Future Appointments  Date Time Provider [place]  11/21/2021 10:30 AM Radene Gunning, MD [facility]    Silas [place], MD

## 2021-11-21 ENCOUNTER — Encounter: Payer: Self-pay | Admitting: Obstetrics and Gynecology

## 2021-12-06 NOTE — Progress Notes (Signed)
   PRENATAL VISIT NOTE  Subjective:  Sandra Harding is a 26 y.o. G3P2002 at [redacted]w[redacted]d being seen today for ongoing prenatal care.  She is currently monitored for the following issues for this low-risk pregnancy and has History of migraine; Cystic fibrosis gene carrier; History of gestational hypertension; and Supervision of normal pregnancy on their problem list.  Patient reports no complaints.  Contractions: Not present. Vag. Bleeding: None.  Movement: Absent. Denies leaking of fluid.   The following portions of the patient's history were reviewed and updated as appropriate: allergies, current medications, past family history, past medical history, past social history, past surgical history and problem list.   Objective:   Vitals:   12/10/21 1019  BP: 110/72  Pulse: 67  Weight: 148 lb (67.1 kg)    Fetal Status: Fetal Heart Rate (bpm): 151   Movement: Absent     General:  Alert, oriented and cooperative. Patient is in no acute distress.  Skin: Skin is warm and dry. No rash noted.   Cardiovascular: Normal heart rate noted  Respiratory: Normal respiratory effort, no problems with respiration noted  Abdomen: Soft, gravid, appropriate for gestational age.  Pain/Pressure: Absent     Pelvic: Cervical exam deferred        Extremities: Normal range of motion.  Edema: None  Mental Status: Normal mood and affect. Normal behavior. Normal judgment and thought content.   Assessment and Plan:  Pregnancy: G3P2002 at [redacted]w[redacted]d 1. Encounter for supervision of normal pregnancy, antepartum, unspecified gravidity Anatomy scan ordered Recommended MSAFP - pt accepts MOC: Considering vasectomy, but undecided. Discussed patch and nuvaring once no longer nursing. She has done IUD but had it embedded. She does not want Nexplanon and cannot remember the pill every day.  MOF: Plans to breastfeed/pump and then will convert to formula.  Circ plan: Desires Needs new OB labs drawn still including HIV and HCV. Can do  today with MSAFP.  She is s/p flu shot.   2. History of gestational hypertension Recommended ldASA  - pt is taking it  3. Cystic fibrosis gene carrier Partner testing - He has appt on the 30th with PCP office.   Preterm labor symptoms and general obstetric precautions including but not limited to vaginal bleeding, contractions, leaking of fluid and fetal movement were reviewed in detail with the patient. Please refer to After Visit Summary for other counseling recommendations.   Return in about 4 weeks (around 01/07/2022).  Future Appointments  Date Time Provider Department Center  01/07/2022  9:30 AM Lennart Pall, MD [facility]     Milas Hock, MD

## 2021-12-10 ENCOUNTER — Encounter: Payer: Self-pay | Admitting: Obstetrics and Gynecology

## 2021-12-10 ENCOUNTER — Ambulatory Visit (INDEPENDENT_AMBULATORY_CARE_PROVIDER_SITE_OTHER): Payer: Medicaid Other | Admitting: Obstetrics and Gynecology

## 2021-12-10 VITALS — BP 110/72 | HR 67 | Wt 148.0 lb

## 2021-12-10 DIAGNOSIS — Z3A16 16 weeks gestation of pregnancy: Secondary | ICD-10-CM

## 2021-12-10 DIAGNOSIS — Z3482 Encounter for supervision of other normal pregnancy, second trimester: Secondary | ICD-10-CM

## 2021-12-10 DIAGNOSIS — Z141 Cystic fibrosis carrier: Secondary | ICD-10-CM

## 2021-12-10 DIAGNOSIS — Z8759 Personal history of other complications of pregnancy, childbirth and the puerperium: Secondary | ICD-10-CM

## 2021-12-10 DIAGNOSIS — Z3490 Encounter for supervision of normal pregnancy, unspecified, unspecified trimester: Secondary | ICD-10-CM

## 2021-12-12 LAB — ALPHA FETOPROTEIN, MATERNAL
AFP MoM: 1.35
AFP, Serum: 50.7 ng/mL
Calc'd Gestational Age: 16.7 weeks
Maternal Wt: 149 [lb_av]
Risk for ONTD: 1
Twins-AFP: 1

## 2022-01-03 ENCOUNTER — Telehealth: Payer: Self-pay | Admitting: Obstetrics and Gynecology

## 2022-01-07 ENCOUNTER — Ambulatory Visit (INDEPENDENT_AMBULATORY_CARE_PROVIDER_SITE_OTHER): Payer: Medicaid Other | Admitting: Obstetrics and Gynecology

## 2022-01-07 VITALS — BP 117/77 | HR 65 | Wt 158.0 lb

## 2022-01-07 DIAGNOSIS — Z141 Cystic fibrosis carrier: Secondary | ICD-10-CM

## 2022-01-07 DIAGNOSIS — Z8759 Personal history of other complications of pregnancy, childbirth and the puerperium: Secondary | ICD-10-CM

## 2022-01-07 DIAGNOSIS — Z3490 Encounter for supervision of normal pregnancy, unspecified, unspecified trimester: Secondary | ICD-10-CM

## 2022-01-07 DIAGNOSIS — Z3A20 20 weeks gestation of pregnancy: Secondary | ICD-10-CM

## 2022-01-07 LAB — HEPATITIS C ANTIBODY: HCV Ab: NEGATIVE

## 2022-01-07 NOTE — Progress Notes (Signed)
   PRENATAL VISIT NOTE  Subjective:  Sandra Harding is a 26 y.o. G3P2002 at [redacted]w[redacted]d being seen today for ongoing prenatal care.  She is currently monitored for the following issues for this low-risk pregnancy and has History of migraine; Cystic fibrosis gene carrier; History of gestational hypertension; and Supervision of normal pregnancy on their problem list.  Patient reports  RLQ pain - intermittent, resolves spontaneously, not requiring pain medications, no n/v/f/c/constipation .  Contractions: Not present. Vag. Bleeding: None.  Movement: Present. Denies leaking of fluid.   The following portions of the patient's history were reviewed and updated as appropriate: allergies, current medications, past family history, past medical history, past social history, past surgical history and problem list.   Objective:   Vitals:   01/07/22 0943  BP: 117/77  Pulse: 65  Weight: 158 lb (71.7 kg)   Fetal Status: Fetal Heart Rate (bpm): 154   Movement: Present     General:  Alert, oriented and cooperative. Patient is in no acute distress.  Skin: Skin is warm and dry. No rash noted.   Cardiovascular: Normal heart rate noted  Respiratory: Normal respiratory effort, no problems with respiration noted  Abdomen: Soft, gravid, appropriate for gestational age. No RLQ tenderness, pain is not reproducible with uterine manipulation. Fundus at level of umbilicus  Pain/Pressure: Absent      Assessment and Plan:  Pregnancy: G3P2002 at [redacted]w[redacted]d 1. Encounter for supervision of normal pregnancy, antepartum, unspecified gravidity OB panel to be drawn today Anatomy US scheduled 12/20  2. History of gestational hypertension Taking ldASA  3. Cystic fibrosis gene carrier Partner testing  General obstetric precautions including but not limited to vaginal bleeding, contractions, leaking of fluid and fetal movement were reviewed in detail with the patient.  Please refer to After Visit Summary for other counseling  recommendations.   Return in about 4 weeks (around 02/04/2022).  Future Appointments  Date Time Provider Department Center  01/09/2022  1:30 PM [facility] NURSE [facility]  01/09/2022  1:45 PM [facility]5 [facility]  01/30/2022  9:30 AM Constant, Gigi Gin, MD [facility]   Lennart Pall, MD

## 2022-01-08 LAB — OBSTETRIC PANEL
Absolute Monocytes: 248 cells/uL (ref 200–950)
Antibody Screen: NOT DETECTED
Basophils Absolute: 22 cells/uL (ref 0–200)
Basophils Relative: 0.4 %
Eosinophils Absolute: 103 cells/uL (ref 15–500)
Eosinophils Relative: 1.9 %
HCT: 35.9 % (ref 35.0–45.0)
Hemoglobin: 12.1 g/dL (ref 11.7–15.5)
Hepatitis B Surface Ag: NONREACTIVE
Lymphs Abs: 1258 cells/uL (ref 850–3900)
MCH: 30.9 pg (ref 27.0–33.0)
MCHC: 33.7 g/dL (ref 32.0–36.0)
MCV: 91.6 fL (ref 80.0–100.0)
MPV: 11 fL (ref 7.5–12.5)
Monocytes Relative: 4.6 %
Neutro Abs: 3769 cells/uL (ref 1500–7800)
Neutrophils Relative %: 69.8 %
Platelets: 278 10*3/uL (ref 140–400)
RBC: 3.92 10*6/uL (ref 3.80–5.10)
RDW: 11.8 % (ref 11.0–15.0)
RPR Ser Ql: NONREACTIVE
Rubella: 5.12 Index
Total Lymphocyte: 23.3 %
WBC: 5.4 10*3/uL (ref 3.8–10.8)

## 2022-01-08 LAB — HIV ANTIBODY (ROUTINE TESTING W REFLEX): HIV 1&2 Ab, 4th Generation: NONREACTIVE

## 2022-01-08 LAB — HEPATITIS C ANTIBODY: Hepatitis C Ab: NONREACTIVE

## 2022-01-09 ENCOUNTER — Ambulatory Visit: Payer: Medicaid Other

## 2022-01-21 HISTORY — DX: Maternal care for unspecified type scar from previous cesarean delivery: O34.219

## 2022-01-21 NOTE — L&D Delivery Note (Signed)
OB/GYN Faculty Practice Delivery Note  Sandra Harding is a 27 y.o. O0H2122 s/p VBAC at [redacted]w[redacted]d. She was admitted for IOL 2/2 gHTN.   ROM: 11h 39m with clear fluid GBS Status:  NEGATIVE/-- (04/10 0953) Maximum Maternal Temperature: 99.1  Labor Progress: Initial SVE: 2/50/-3. She then progressed to complete.   Delivery Date/Time: 05/02/2022 @0336  Delivery: Called to room and patient was complete and pushing. Head delivered ROA. No nuchal cord present. Shoulder and body delivered with hands over FOB hands. Infant with spontaneous cry, placed on mother's abdomen, dried and stimulated. Cord clamped x 2 after 1-minute delay, and cut by FOB. Cord blood drawn. Placenta delivered spontaneously with gentle cord traction. Fundus firm with massage and Pitocin. Labia, perineum, vagina, and cervix inspected with hemostatic first degree perineal and periurethral laceration not requiring repair.  Baby Weight: pending  Placenta: 3 vessel, intact. Sent to L&D Complications: None Lacerations: 1st degree perineal, periurethral EBL: 78 mL Analgesia: Epidural   Infant:  APGAR (1 MIN): 9   APGAR (5 MINS): 9    Sandra Axon Autry-Lott, DO OB Fellow, Faculty [facility], [facility] [employer] 05/02/2022, 8:22 AM

## 2022-01-30 ENCOUNTER — Ambulatory Visit (INDEPENDENT_AMBULATORY_CARE_PROVIDER_SITE_OTHER): Payer: Medicaid Other | Admitting: Obstetrics and Gynecology

## 2022-01-30 ENCOUNTER — Encounter: Payer: Self-pay | Admitting: Obstetrics and Gynecology

## 2022-01-30 VITALS — BP 115/77 | HR 76 | Wt 165.0 lb

## 2022-01-30 DIAGNOSIS — Z3482 Encounter for supervision of other normal pregnancy, second trimester: Secondary | ICD-10-CM

## 2022-01-30 DIAGNOSIS — Z3A24 24 weeks gestation of pregnancy: Secondary | ICD-10-CM

## 2022-01-30 DIAGNOSIS — Z8759 Personal history of other complications of pregnancy, childbirth and the puerperium: Secondary | ICD-10-CM

## 2022-01-30 DIAGNOSIS — Z141 Cystic fibrosis carrier: Secondary | ICD-10-CM

## 2022-01-30 NOTE — Progress Notes (Signed)
   PRENATAL VISIT NOTE  Subjective:  Sandra Harding is a 27 y.o. G3P2002 at [redacted]w[redacted]d being seen today for ongoing prenatal care.  She is currently monitored for the following issues for this low-risk pregnancy and has History of migraine; Cystic fibrosis gene carrier; History of gestational hypertension; and Supervision of normal pregnancy on their problem list.  Patient reports no complaints.  Contractions: Not present. Vag. Bleeding: None.  Movement: Present. Denies leaking of fluid.   The following portions of the patient's history were reviewed and updated as appropriate: allergies, current medications, past family history, past medical history, past social history, past surgical history and problem list.   Objective:   Vitals:   01/30/22 0916  BP: 115/77  Pulse: 76  Weight: 165 lb (74.8 kg)    Fetal Status: Fetal Heart Rate (bpm): 150 Fundal Height: 24 cm Movement: Present     General:  Alert, oriented and cooperative. Patient is in no acute distress.  Skin: Skin is warm and dry. No rash noted.   Cardiovascular: Normal heart rate noted  Respiratory: Normal respiratory effort, no problems with respiration noted  Abdomen: Soft, gravid, appropriate for gestational age.  Pain/Pressure: Absent     Pelvic: Cervical exam deferred        Extremities: Normal range of motion.  Edema: None  Mental Status: Normal mood and affect. Normal behavior. Normal judgment and thought content.   Assessment and Plan:  Pregnancy: G3P2002 at [redacted]w[redacted]d 1. Encounter for supervision of other normal pregnancy in second trimester Patient is doing well without complaints Third trimester labs and glucola next visit Follow up anatomy ultrasound tomorrow  2. History of gestational hypertension Normotensive Continue ASA  3. Cystic fibrosis gene carrier Partner is [facility]  Preterm labor symptoms and general obstetric precautions including but not limited to  vaginal bleeding, contractions, leaking of fluid and fetal movement were reviewed in detail with the patient. Please refer to After Visit Summary for other counseling recommendations.   Return in about 4 weeks (around 02/27/2022) for in person, ROB, Low risk, 2 hr glucola next visit.  Future Appointments  Date Time Provider [place]  01/31/2022 12:30 PM [facility] NURSE [facility]  01/31/2022 12:45 PM [facility] US[facility]    Mora Bellman, MD

## 2022-01-31 ENCOUNTER — Ambulatory Visit: Payer: Medicaid Other | Attending: Obstetrics and Gynecology | Admitting: *Deleted

## 2022-01-31 ENCOUNTER — Ambulatory Visit (HOSPITAL_BASED_OUTPATIENT_CLINIC_OR_DEPARTMENT_OTHER): Payer: Medicaid Other

## 2022-01-31 ENCOUNTER — Other Ambulatory Visit: Payer: Self-pay | Admitting: *Deleted

## 2022-01-31 VITALS — BP 117/71 | HR 67

## 2022-01-31 DIAGNOSIS — O321XX0 Maternal care for breech presentation, not applicable or unspecified: Secondary | ICD-10-CM | POA: Diagnosis present

## 2022-01-31 DIAGNOSIS — Z363 Encounter for antenatal screening for malformations: Secondary | ICD-10-CM | POA: Insufficient documentation

## 2022-01-31 DIAGNOSIS — Z3490 Encounter for supervision of normal pregnancy, unspecified, unspecified trimester: Secondary | ICD-10-CM

## 2022-01-31 DIAGNOSIS — O99282 Endocrine, nutritional and metabolic diseases complicating pregnancy, second trimester: Secondary | ICD-10-CM | POA: Insufficient documentation

## 2022-01-31 DIAGNOSIS — O09292 Supervision of pregnancy with other poor reproductive or obstetric history, second trimester: Secondary | ICD-10-CM | POA: Insufficient documentation

## 2022-01-31 DIAGNOSIS — Z3A24 24 weeks gestation of pregnancy: Secondary | ICD-10-CM | POA: Insufficient documentation

## 2022-01-31 DIAGNOSIS — O09892 Supervision of other high risk pregnancies, second trimester: Secondary | ICD-10-CM | POA: Diagnosis not present

## 2022-01-31 DIAGNOSIS — Z8759 Personal history of other complications of pregnancy, childbirth and the puerperium: Secondary | ICD-10-CM

## 2022-01-31 DIAGNOSIS — O34219 Maternal care for unspecified type scar from previous cesarean delivery: Secondary | ICD-10-CM

## 2022-01-31 DIAGNOSIS — Z362 Encounter for other antenatal screening follow-up: Secondary | ICD-10-CM

## 2022-01-31 DIAGNOSIS — Z3482 Encounter for supervision of other normal pregnancy, second trimester: Secondary | ICD-10-CM

## 2022-01-31 DIAGNOSIS — Z141 Cystic fibrosis carrier: Secondary | ICD-10-CM

## 2022-02-26 NOTE — Progress Notes (Unsigned)
   PRENATAL VISIT NOTE  Subjective:  Sandra Harding is a 27 y.o. G3P2002 at [redacted]w[redacted]d being seen today for ongoing prenatal care.  She is currently monitored for the following issues for this low-risk pregnancy and has History of migraine; Cystic fibrosis gene carrier; History of gestational hypertension; and Supervision of normal pregnancy on their problem list.  Patient reports {[id number]}.   .  .   . Denies leaking of fluid.   The following portions of the patient's history were reviewed and updated as appropriate: allergies, current medications, past family history, past medical history, past social history, past surgical history and problem list.   Objective:  There were no vitals filed for this visit.  Fetal Status:           General:  Alert, oriented and cooperative. Patient is in no acute distress.  Skin: Skin is warm and dry. No rash noted.   Cardiovascular: Normal heart rate noted  Respiratory: Normal respiratory effort, no problems with respiration noted  Abdomen: Soft, gravid, appropriate for gestational age.        Pelvic: Cervical exam deferred        Extremities: Normal range of motion.     Mental Status: Normal mood and affect. Normal behavior. Normal judgment and thought content.   Assessment and Plan:  Pregnancy: G3P2002 at [redacted]w[redacted]d 1. Cystic fibrosis gene carrier Partner is looking into a [facility] testing ***  2. Encounter for supervision of other normal pregnancy in third trimester - Offered and recommended TDAP - pt ***  3. History of gestational hypertension Continue ldASA BP ***  Preterm labor symptoms and general obstetric precautions including but not limited to vaginal bleeding, contractions, leaking of fluid and fetal movement were reviewed in detail with the patient. Please refer to After Visit Summary for other counseling recommendations.   No follow-ups on file.  Future Appointments  Date Time Provider [place]   02/27/2022  8:50 AM Radene Gunning, MD [facility]  03/04/2022 10:45 AM [facility] NURSE [facility]  03/04/2022 11:00 AM [facility] US1 [facility]    Radene Gunning, MD

## 2022-02-27 ENCOUNTER — Ambulatory Visit (INDEPENDENT_AMBULATORY_CARE_PROVIDER_SITE_OTHER): Payer: Medicaid Other | Admitting: Obstetrics and Gynecology

## 2022-02-27 ENCOUNTER — Encounter: Payer: Self-pay | Admitting: Obstetrics and Gynecology

## 2022-02-27 VITALS — BP 117/72 | HR 67 | Wt 174.0 lb

## 2022-02-27 DIAGNOSIS — Z3482 Encounter for supervision of other normal pregnancy, second trimester: Secondary | ICD-10-CM | POA: Diagnosis not present

## 2022-02-27 DIAGNOSIS — Z23 Encounter for immunization: Secondary | ICD-10-CM | POA: Diagnosis not present

## 2022-02-27 DIAGNOSIS — Z3A28 28 weeks gestation of pregnancy: Secondary | ICD-10-CM

## 2022-02-27 DIAGNOSIS — Z141 Cystic fibrosis carrier: Secondary | ICD-10-CM

## 2022-02-27 DIAGNOSIS — Z8759 Personal history of other complications of pregnancy, childbirth and the puerperium: Secondary | ICD-10-CM

## 2022-02-28 LAB — CBC
Hematocrit: 31.9 % — ABNORMAL LOW (ref 34.0–46.6)
Hemoglobin: 10.9 g/dL — ABNORMAL LOW (ref 11.1–15.9)
MCH: 30.5 pg (ref 26.6–33.0)
MCHC: 34.2 g/dL (ref 31.5–35.7)
MCV: 89 fL (ref 79–97)
Platelets: 234 10*3/uL (ref 150–450)
RBC: 3.57 x10E6/uL — ABNORMAL LOW (ref 3.77–5.28)
RDW: 11.2 % — ABNORMAL LOW (ref 11.7–15.4)
WBC: 6.8 10*3/uL (ref 3.4–10.8)

## 2022-02-28 LAB — RPR: RPR Ser Ql: NONREACTIVE

## 2022-02-28 LAB — GLUCOSE TOLERANCE, 2 HOURS W/ 1HR
Glucose, 1 hour: 141 mg/dL (ref 70–179)
Glucose, 2 hour: 93 mg/dL (ref 70–152)
Glucose, Fasting: 69 mg/dL — ABNORMAL LOW (ref 70–91)

## 2022-02-28 LAB — HIV ANTIBODY (ROUTINE TESTING W REFLEX): HIV Screen 4th Generation wRfx: NONREACTIVE

## 2022-03-04 ENCOUNTER — Ambulatory Visit: Payer: Medicaid Other | Attending: Maternal & Fetal Medicine

## 2022-03-04 ENCOUNTER — Other Ambulatory Visit: Payer: Self-pay | Admitting: *Deleted

## 2022-03-04 ENCOUNTER — Ambulatory Visit: Payer: Medicaid Other | Admitting: *Deleted

## 2022-03-04 VITALS — BP 120/74 | HR 73

## 2022-03-04 DIAGNOSIS — O34219 Maternal care for unspecified type scar from previous cesarean delivery: Secondary | ICD-10-CM | POA: Insufficient documentation

## 2022-03-04 DIAGNOSIS — Z8759 Personal history of other complications of pregnancy, childbirth and the puerperium: Secondary | ICD-10-CM | POA: Diagnosis present

## 2022-03-04 DIAGNOSIS — Z141 Cystic fibrosis carrier: Secondary | ICD-10-CM | POA: Diagnosis present

## 2022-03-04 DIAGNOSIS — Z362 Encounter for other antenatal screening follow-up: Secondary | ICD-10-CM | POA: Insufficient documentation

## 2022-03-04 DIAGNOSIS — Z3A28 28 weeks gestation of pregnancy: Secondary | ICD-10-CM | POA: Diagnosis not present

## 2022-03-04 DIAGNOSIS — O09293 Supervision of pregnancy with other poor reproductive or obstetric history, third trimester: Secondary | ICD-10-CM

## 2022-03-04 DIAGNOSIS — O09892 Supervision of other high risk pregnancies, second trimester: Secondary | ICD-10-CM | POA: Insufficient documentation

## 2022-03-12 DIAGNOSIS — Q2112 Patent foramen ovale: Secondary | ICD-10-CM | POA: Insufficient documentation

## 2022-03-12 DIAGNOSIS — G43419 Hemiplegic migraine, intractable, without status migrainosus: Secondary | ICD-10-CM | POA: Insufficient documentation

## 2022-03-18 ENCOUNTER — Encounter: Payer: Medicaid Other | Admitting: Obstetrics & Gynecology

## 2022-03-20 ENCOUNTER — Telehealth (INDEPENDENT_AMBULATORY_CARE_PROVIDER_SITE_OTHER): Payer: Medicaid Other | Admitting: Obstetrics and Gynecology

## 2022-03-20 ENCOUNTER — Telehealth: Payer: Self-pay | Admitting: *Deleted

## 2022-03-20 ENCOUNTER — Other Ambulatory Visit: Payer: Self-pay | Admitting: *Deleted

## 2022-03-20 VITALS — BP 133/77 | HR 82 | Wt 184.0 lb

## 2022-03-20 DIAGNOSIS — Z3483 Encounter for supervision of other normal pregnancy, third trimester: Secondary | ICD-10-CM

## 2022-03-20 DIAGNOSIS — Z3A31 31 weeks gestation of pregnancy: Secondary | ICD-10-CM

## 2022-03-20 DIAGNOSIS — Z141 Cystic fibrosis carrier: Secondary | ICD-10-CM

## 2022-03-20 DIAGNOSIS — Z3482 Encounter for supervision of other normal pregnancy, second trimester: Secondary | ICD-10-CM

## 2022-03-20 DIAGNOSIS — Z8759 Personal history of other complications of pregnancy, childbirth and the puerperium: Secondary | ICD-10-CM

## 2022-03-20 NOTE — Progress Notes (Signed)
   PRENATAL VISIT NOTE  Subjective:  Sandra Harding is a 27 y.o. G3P2002 at 86w0dbeing seen today for ongoing prenatal care.  She is currently monitored for the following issues for this low-risk pregnancy and has History of migraine; Cystic fibrosis gene carrier; History of gestational hypertension; and Supervision of normal pregnancy on their problem list.  Patient reports no complaints. Tired, but feels like normal tiredness of pregnancy. Contractions: Not present. Vag. Bleeding: None.  Movement: Present. Denies leaking of fluid.   The following portions of the patient's history were reviewed and updated as appropriate: allergies, current medications, past family history, past medical history, past social history, past surgical history and problem list.   Objective:   Vitals:   03/20/22 0828  BP: 133/77  Pulse: 82  Weight: 184 lb (83.5 kg)   Fetal Status:     Movement: Present    Virtual visit General:  Alert, oriented and cooperative. Patient is in no acute distress.  Skin: Skin is warm and dry. No rash noted.   Cardiovascular: Normal heart rate noted  Respiratory: Normal respiratory effort, no problems with respiration noted  Abdomen: Soft, gravid, appropriate for gestational age.  Pain/Pressure: Present      Assessment and Plan:  Pregnancy: G3P2002 at 389w0d. Encounter for supervision of other normal pregnancy in second trimester 2. [redacted] weeks gestation of pregnancy Pt will check that her pediatrician is accepting new patients. Plans to [facility] [place] [facility] [address] [place] 3. Cystic fibrosis gene carrier [place] not heard [place] [facility] [place] [facility] Discussed potentially getting test kit here at next appointment  4. History of gestational hypertension Normotensive today  Future Appointments  Date Time Provider [place]3/11/2022  9:30 AM LeGuss BundeMD [facility]3/25/2024  9:30 AM LeGuss BundeMD [facility]/26/2024   9:15 AM [facility]  9:30 AM [facility] US3 [facility]    KyInez CatalinaMD

## 2022-03-20 NOTE — Telephone Encounter (Signed)
Left patient a message to call and schedule appointments for April or she can wait until next appointment on 04/01/2022.

## 2022-04-01 ENCOUNTER — Encounter: Payer: Self-pay | Admitting: Obstetrics & Gynecology

## 2022-04-01 ENCOUNTER — Ambulatory Visit (INDEPENDENT_AMBULATORY_CARE_PROVIDER_SITE_OTHER): Payer: Medicaid Other | Admitting: Obstetrics & Gynecology

## 2022-04-01 VITALS — BP 118/71 | HR 79 | Wt 188.0 lb

## 2022-04-01 DIAGNOSIS — Z8759 Personal history of other complications of pregnancy, childbirth and the puerperium: Secondary | ICD-10-CM

## 2022-04-01 DIAGNOSIS — Z3482 Encounter for supervision of other normal pregnancy, second trimester: Secondary | ICD-10-CM

## 2022-04-01 DIAGNOSIS — Z141 Cystic fibrosis carrier: Secondary | ICD-10-CM

## 2022-04-01 DIAGNOSIS — Z3A32 32 weeks gestation of pregnancy: Secondary | ICD-10-CM

## 2022-04-01 DIAGNOSIS — Z3480 Encounter for supervision of other normal pregnancy, unspecified trimester: Secondary | ICD-10-CM

## 2022-04-01 NOTE — Progress Notes (Signed)
   PRENATAL VISIT NOTE  Subjective:  Sandra Harding is a 27 y.o. G3P2002 at [redacted]w[redacted]d being seen today for ongoing prenatal care.  She is currently monitored for the following issues for this low-risk pregnancy and has History of migraine; Cystic fibrosis carrier; History of gestational hypertension; Supervision of normal pregnancy; Intractable hemiplegic migraine without status migrainosus; and PFO (patent foramen ovale) on their problem list.  Patient reports  hand swelling (nml BPs) .  Contractions: Not present. Vag. Bleeding: None.  Movement: Present. Denies leaking of fluid.   The following portions of the patient's history were reviewed and updated as appropriate: allergies, current medications, past family history, past medical history, past social history, past surgical history and problem list.   Objective:   Vitals:   04/01/22 0924  BP: 118/71  Pulse: 79  Weight: 188 lb (85.3 kg)    Fetal Status: Fetal Heart Rate (bpm): 149   Movement: Present     General:  Alert, oriented and cooperative. Patient is in no acute distress.  Skin: Skin is warm and dry. No rash noted.   Cardiovascular: Normal heart rate noted  Respiratory: Normal respiratory effort, no problems with respiration noted  Abdomen: Soft, gravid, appropriate for gestational age.  Pain/Pressure: Absent     Pelvic: Cervical exam deferred        Extremities: Normal range of motion.  Edema: Trace  Mental Status: Normal mood and affect. Normal behavior. Normal judgment and thought content.   Assessment and Plan:  Pregnancy: G3P2002 at [redacted]w[redacted]d 1. Cystic fibrosis carrier FOB is not going to be able to be tested--pds to be notified at birth  2. History of gestational hypertension BPs nml.  Pt encouraged to take BPs and place in Baby Rx; more than 1x a week.     Preterm labor symptoms and general obstetric precautions including but not limited to vaginal bleeding, contractions, leaking of fluid and fetal movement were  reviewed in detail with the patient. Please refer to After Visit Summary for other counseling recommendations.   Return in about 3 weeks (around 04/22/2022).  Future Appointments  Date Time Provider [place]  04/15/2022  9:30 AM Guss Bunde, MD [facility]  04/16/2022  9:15 AM [facility] NURSE [facility]  04/16/2022  9:30 AM [facility] US3 [facility]  04/29/2022  9:30 AM Janyth Pupa, DO [facility]  05/06/2022  9:50 AM Guss Bunde, MD [facility]  05/13/2022  9:50 AM Guss Bunde, MD [facility]  05/20/2022  9:30 AM Inez Catalina, MD [facility]    Silas [place], MD

## 2022-04-09 ENCOUNTER — Inpatient Hospital Stay (HOSPITAL_COMMUNITY)
Admission: AD | Admit: 2022-04-09 | Discharge: 2022-04-09 | Disposition: A | Discharge status: Home / Self Care | Payer: Medicaid Other | Attending: Obstetrics and Gynecology | Admitting: Obstetrics and Gynecology

## 2022-04-09 ENCOUNTER — Encounter (HOSPITAL_COMMUNITY): Payer: Self-pay | Admitting: Obstetrics and Gynecology

## 2022-04-09 DIAGNOSIS — O26893 Other specified pregnancy related conditions, third trimester: Secondary | ICD-10-CM | POA: Insufficient documentation

## 2022-04-09 DIAGNOSIS — R519 Headache, unspecified: Secondary | ICD-10-CM | POA: Insufficient documentation

## 2022-04-09 DIAGNOSIS — Z3A33 33 weeks gestation of pregnancy: Secondary | ICD-10-CM | POA: Diagnosis not present

## 2022-04-09 LAB — CBC
HCT: 29.5 % — ABNORMAL LOW (ref 36.0–46.0)
Hemoglobin: 10.3 g/dL — ABNORMAL LOW (ref 12.0–15.0)
MCH: 31.2 pg (ref 26.0–34.0)
MCHC: 34.9 g/dL (ref 30.0–36.0)
MCV: 89.4 fL (ref 80.0–100.0)
Platelets: 190 10*3/uL (ref 150–400)
RBC: 3.3 MIL/uL — ABNORMAL LOW (ref 3.87–5.11)
RDW: 11.8 % (ref 11.5–15.5)
WBC: 10.4 10*3/uL (ref 4.0–10.5)
nRBC: 0 % (ref 0.0–0.2)

## 2022-04-09 LAB — PROTEIN / CREATININE RATIO, URINE
Creatinine, Urine: 77 mg/dL
Total Protein, Urine: <6 mg/dL

## 2022-04-09 LAB — COMPREHENSIVE METABOLIC PANEL
ALT: 12 U/L (ref 0–44)
AST: 18 U/L (ref 15–41)
Albumin: 2.6 g/dL — ABNORMAL LOW (ref 3.5–5.0)
Alkaline Phosphatase: 119 U/L (ref 38–126)
Anion gap: 11 (ref 5–15)
BUN: 11 mg/dL (ref 6–20)
CO2: 21 mmol/L — ABNORMAL LOW (ref 22–32)
Calcium: 9.1 mg/dL (ref 8.9–10.3)
Chloride: 103 mmol/L (ref 98–111)
Creatinine, Ser: 0.8 mg/dL (ref 0.44–1.00)
GFR, Estimated: >60 mL/min (ref >60)
Glucose, Bld: 90 mg/dL (ref 70–99)
Potassium: 3.4 mmol/L — ABNORMAL LOW (ref 3.5–5.1)
Sodium: 135 mmol/L (ref 135–145)
Total Bilirubin: 0.3 mg/dL (ref 0.3–1.2)
Total Protein: 6.2 g/dL — ABNORMAL LOW (ref 6.5–8.1)

## 2022-04-09 MED ORDER — ACETAMINOPHEN-CAFFEINE 500-65 MG PO TABS
2.0000 | ORAL_TABLET | Freq: Once | ORAL | Status: AC
  Administered 2022-04-09: 2 via ORAL
  Filled 2022-04-09: qty 2

## 2022-04-09 NOTE — MAU Note (Addendum)
Pt says she checked BP at 2300- 143/94. She called nurse lin- told to come in . No BP meds.  [place]  H/A - all day 0800- has no meds-  6/10 No vision changes No epigastric  No reg UC's

## 2022-04-09 NOTE — MAU Provider Note (Signed)
History     CSN: [id number]  Arrival date and time: 04/09/22 0000   None     Chief Complaint  Patient presents with   Hypertension   HPI Sandra Harding is a 27 y.o. G3P2002 at [redacted]w[redacted]d who presents to MAU for elevated BP. Patient reports she took her BP this evening and it was 143/94. She called on call nurse and was told to come in. She reports she has had a headache all day that she currently rates 6/10, but has not taken anything to relieve it. She denies vision changes, RUQ/epigastric pain or severe swelling. She denies contractions, vaginal bleeding or ruptured membranes. She endorses active fetal movement.  Pregnancy course: uncomplicated. Has history of gHTN. Patient receives [facility] [place] [facility], next appointment is 3/25   OB History     Gravida  3   Para  2   Term  2   Preterm      AB      Living  2      SAB      IAB      Ectopic      Multiple  0   Live Births  2           Past Medical History:  Diagnosis Date   Headache    PFO (patent foramen ovale)    Vitamin B 12 deficiency     Past Surgical History:  Procedure Laterality Date   CESAREAN SECTION N/A 09/27/2013   Procedure: CESAREAN SECTION;  Surgeon: Guss Bunde, MD;  Location: [facility];  Service: Obstetrics;  Laterality: N/A;    Family History  Problem Relation Age of Onset   Cancer Maternal Grandfather    Diabetes Paternal Aunt    Heart attack Maternal Grandmother    Alzheimer's disease Paternal Grandfather    Cancer Maternal Uncle     Social History   Tobacco Use   Smoking status: Former   Smokeless tobacco: Never  Scientific laboratory technician Use: Former   Substances: Nicotine, Flavoring  Substance Use Topics   Alcohol use: No   Drug use: Not Currently    Types: Marijuana    Comment: last use seep 2023    Allergies:  Allergies  Allergen Reactions   Sulfa Antibiotics Hives and Rash    No medications prior to admission.   Review of Systems  Neurological:  Positive for  headaches.  All other systems reviewed and are negative.  Physical Exam  Patient Vitals for the past 24 hrs:  BP Temp Temp src Pulse Resp SpO2 Height Weight  04/09/22 0314 131/84 (!) 97.5 F (36.4 C) Oral 70 17 100 % -- --  04/09/22 0146 121/73 -- -- 76 -- 98 % -- --  04/09/22 0140 -- -- -- -- -- 97 % -- --  04/09/22 0136 128/79 -- -- 78 -- -- -- --  04/09/22 0123 125/75 -- -- 80 -- -- -- --  04/09/22 0057 123/89 97.9 F (36.6 C) Oral 97 16 -- 5\' 2"  (1.575 m) 86.8 kg   Physical Exam Vitals and nursing note reviewed.  Constitutional:      General: She is not in acute distress. Eyes:     Extraocular Movements: Extraocular movements intact.     Pupils: Pupils are equal, round, and reactive to light.  Cardiovascular:     Rate and Rhythm: Normal rate.  Pulmonary:     Effort: Pulmonary effort is normal. No respiratory distress.  Abdominal:  Palpations: Abdomen is soft.     Tenderness: There is no abdominal tenderness.     Comments: Gravid   Musculoskeletal:        General: Normal range of motion.     Cervical back: Normal range of motion.  Skin:    General: Skin is warm and dry.  Neurological:     General: No focal deficit present.     Mental Status: She is alert and oriented to person, place, and time.  Psychiatric:        Mood and Affect: Mood normal.        Behavior: Behavior normal.    NST FHR: 135 bpm, moderate variability, +15x15 accels, no decels Toco: quiet  Results for orders placed or performed during the hospital encounter of 04/09/22 (from the past 24 hour(s))  Protein / creatinine ratio, urine     Status: None   Collection Time: 04/09/22  1:00 AM  Result Value Ref Range   Creatinine, Urine 77 mg/dL   Total Protein, Urine <6 mg/dL   Protein Creatinine Ratio        0.00 - 0.15 mg/mg[Cre]  CBC     Status: Abnormal   Collection Time: 04/09/22  1:34 AM  Result Value Ref Range   WBC 10.4 4.0 - 10.5 K/uL   RBC 3.30 (L) 3.87 - 5.11 MIL/uL   Hemoglobin 10.3  (L) 12.0 - 15.0 g/dL   HCT 29.5 (L) 36.0 - 46.0 %   MCV 89.4 80.0 - 100.0 fL   MCH 31.2 26.0 - 34.0 pg   MCHC 34.9 30.0 - 36.0 g/dL   RDW 11.8 11.5 - 15.5 %   Platelets 190 150 - 400 K/uL   nRBC 0.0 0.0 - 0.2 %  Comprehensive metabolic panel     Status: Abnormal   Collection Time: 04/09/22  1:34 AM  Result Value Ref Range   Sodium 135 135 - 145 mmol/L   Potassium 3.4 (L) 3.5 - 5.1 mmol/L   Chloride 103 98 - 111 mmol/L   CO2 21 (L) 22 - 32 mmol/L   Glucose, Bld 90 70 - 99 mg/dL   BUN 11 6 - 20 mg/dL   Creatinine, Ser 0.80 0.44 - 1.00 mg/dL   Calcium 9.1 8.9 - 10.3 mg/dL   Total Protein 6.2 (L) 6.5 - 8.1 g/dL   Albumin 2.6 (L) 3.5 - 5.0 g/dL   AST 18 15 - 41 U/L   ALT 12 0 - 44 U/L   Alkaline Phosphatase 119 38 - 126 U/L   Total Bilirubin 0.3 0.3 - 1.2 mg/dL   GFR, Estimated >60 >60 mL/min   Anion gap 11 5 - 15   MAU Course  Procedures  MDM CBC, CMP, UPCR Serial BPs NST Excedrin tension  BP's all normotensive. Labs unremarkable. Patient given Excedrin Tension with improvement in headache. NST reactive and reassuring. Low suspicion for pre-eclampsia.  Assessment and Plan   1. [redacted] weeks gestation of pregnancy   2. Headache in pregnancy, antepartum, third trimester    - Discharge home in stable condition - Return precautions given. Return to MAU as needed for new/worsening symptoms - Keep OB appointment as scheduled  Renee Harder, CNM 04/09/2022, 3:19 AM

## 2022-04-15 ENCOUNTER — Ambulatory Visit (INDEPENDENT_AMBULATORY_CARE_PROVIDER_SITE_OTHER): Payer: Medicaid Other | Admitting: Obstetrics & Gynecology

## 2022-04-15 VITALS — BP 128/79 | HR 81 | Wt 191.0 lb

## 2022-04-15 DIAGNOSIS — O133 Gestational [pregnancy-induced] hypertension without significant proteinuria, third trimester: Secondary | ICD-10-CM

## 2022-04-15 DIAGNOSIS — L0100 Impetigo, unspecified: Secondary | ICD-10-CM

## 2022-04-15 DIAGNOSIS — Z3A34 34 weeks gestation of pregnancy: Secondary | ICD-10-CM | POA: Diagnosis not present

## 2022-04-15 DIAGNOSIS — O139 Gestational [pregnancy-induced] hypertension without significant proteinuria, unspecified trimester: Secondary | ICD-10-CM

## 2022-04-15 MED ORDER — FLUTICASONE PROPIONATE 50 MCG/ACT NA SUSP: 1.0000 | Freq: Every day | NASAL | 2 refills | Status: DC

## 2022-04-15 MED ORDER — MUPIROCIN 2 % EX OINT: 1.0000 | TOPICAL_OINTMENT | Freq: Two times a day (BID) | CUTANEOUS | 0 refills | Status: DC

## 2022-04-15 NOTE — Progress Notes (Signed)
   PRENATAL VISIT NOTE  Subjective:  Sandra Harding is a 27 y.o. G3P2002 at [redacted]w[redacted]d being seen today for ongoing prenatal care.  She is currently monitored for the following issues for this low-risk pregnancy and has History of migraine; Cystic fibrosis carrier; History of gestational hypertension; Supervision of normal pregnancy; Intractable hemiplegic migraine without status migrainosus; PFO (patent foramen ovale); and Impetigo on their problem list.  Patient reports  allergies, runny nose, impetigo .  Contractions: Not present. Vag. Bleeding: None.  Movement: Present. Denies leaking of fluid.   The following portions of the patient's history were reviewed and updated as appropriate: allergies, current medications, past family history, past medical history, past social history, past surgical history and problem list.   Objective:   Vitals:   04/15/22 0918  BP: 128/79  Pulse: 81  Weight: 86.6 kg    Fetal Status: Fetal Heart Rate (bpm): 138   Movement: Present     General:  Alert, oriented and cooperative. Patient is in no acute distress.  Skin: Skin is warm and dry. No rash noted.   Cardiovascular: Normal heart rate noted  Respiratory: Normal respiratory effort, no problems with respiration noted  Abdomen: Soft, gravid, appropriate for gestational age.  Pain/Pressure: Absent     Pelvic: Cervical exam deferred        Extremities: Normal range of motion.  Edema: Trace  Mental Status: Normal mood and affect. Normal behavior. Normal judgment and thought content.   Assessment and Plan:  Pregnancy: G3P2002 at [redacted]w[redacted]d 1. Impetigo Treated with Keflex and still has one area of right nostril; Rx Bactroban  2.  Gestational HTN 3 elevated BPs.  One on Baby Rx, one at MAU, one a grandmas. Take BP daily; start antenatal testing.  Labs nml 1 week ago  Baseline:  130 Accelerations: present Decelerations:  absent Variability:  mod Interpretation:  Reactive NST  3.  Seasonal  allergies Allegra Flonase Shower at night  Preterm labor symptoms and general obstetric precautions including but not limited to vaginal bleeding, contractions, leaking of fluid and fetal movement were reviewed in detail with the patient. Please refer to After Visit Summary for other counseling recommendations.   Future Appointments  Date Time Provider [place]  04/16/2022  9:15 AM [facility]SE [facility]  04/16/2022  9:30 AM [facility] US3 [facility]  04/29/2022  9:30 AM Janyth Pupa, DO [facility]  05/06/2022  9:50 AM Guss Bunde, MD [facility]  05/13/2022  9:50 AM Guss Bunde, MD [facility]  05/20/2022  9:30 AM Inez Catalina, MD [facility]    Silas [place], MD

## 2022-04-16 ENCOUNTER — Ambulatory Visit: Payer: Medicaid Other | Attending: Obstetrics and Gynecology

## 2022-04-16 ENCOUNTER — Ambulatory Visit: Payer: Medicaid Other | Admitting: *Deleted

## 2022-04-16 ENCOUNTER — Other Ambulatory Visit: Payer: Self-pay | Admitting: Obstetrics and Gynecology

## 2022-04-16 VITALS — BP 129/85 | HR 74

## 2022-04-16 DIAGNOSIS — O34219 Maternal care for unspecified type scar from previous cesarean delivery: Secondary | ICD-10-CM | POA: Diagnosis not present

## 2022-04-16 DIAGNOSIS — O133 Gestational [pregnancy-induced] hypertension without significant proteinuria, third trimester: Secondary | ICD-10-CM

## 2022-04-16 DIAGNOSIS — O99283 Endocrine, nutritional and metabolic diseases complicating pregnancy, third trimester: Secondary | ICD-10-CM | POA: Diagnosis not present

## 2022-04-16 DIAGNOSIS — Z362 Encounter for other antenatal screening follow-up: Secondary | ICD-10-CM | POA: Diagnosis not present

## 2022-04-16 DIAGNOSIS — O10913 Unspecified pre-existing hypertension complicating pregnancy, third trimester: Secondary | ICD-10-CM | POA: Diagnosis not present

## 2022-04-16 DIAGNOSIS — O1213 Gestational proteinuria, third trimester: Secondary | ICD-10-CM | POA: Insufficient documentation

## 2022-04-16 DIAGNOSIS — O09293 Supervision of pregnancy with other poor reproductive or obstetric history, third trimester: Secondary | ICD-10-CM

## 2022-04-16 DIAGNOSIS — Z8759 Personal history of other complications of pregnancy, childbirth and the puerperium: Secondary | ICD-10-CM | POA: Diagnosis present

## 2022-04-16 DIAGNOSIS — Z3A34 34 weeks gestation of pregnancy: Secondary | ICD-10-CM | POA: Diagnosis not present

## 2022-04-16 DIAGNOSIS — Z3480 Encounter for supervision of other normal pregnancy, unspecified trimester: Secondary | ICD-10-CM

## 2022-04-16 DIAGNOSIS — Z141 Cystic fibrosis carrier: Secondary | ICD-10-CM

## 2022-04-18 ENCOUNTER — Ambulatory Visit: Payer: Medicaid Other | Admitting: *Deleted

## 2022-04-18 DIAGNOSIS — O139 Gestational [pregnancy-induced] hypertension without significant proteinuria, unspecified trimester: Secondary | ICD-10-CM

## 2022-04-22 ENCOUNTER — Ambulatory Visit (INDEPENDENT_AMBULATORY_CARE_PROVIDER_SITE_OTHER): Payer: Medicaid Other | Admitting: *Deleted

## 2022-04-22 ENCOUNTER — Ambulatory Visit (INDEPENDENT_AMBULATORY_CARE_PROVIDER_SITE_OTHER): Payer: Medicaid Other | Admitting: Obstetrics & Gynecology

## 2022-04-22 VITALS — BP 122/71 | HR 75 | Wt 195.0 lb

## 2022-04-22 DIAGNOSIS — Z3480 Encounter for supervision of other normal pregnancy, unspecified trimester: Secondary | ICD-10-CM

## 2022-04-22 DIAGNOSIS — Z3A35 35 weeks gestation of pregnancy: Secondary | ICD-10-CM

## 2022-04-22 DIAGNOSIS — O133 Gestational [pregnancy-induced] hypertension without significant proteinuria, third trimester: Secondary | ICD-10-CM | POA: Diagnosis not present

## 2022-04-22 DIAGNOSIS — G43419 Hemiplegic migraine, intractable, without status migrainosus: Secondary | ICD-10-CM | POA: Diagnosis not present

## 2022-04-22 MED ORDER — CYCLOBENZAPRINE HCL 10 MG PO TABS: 10.0000 mg | ORAL_TABLET | Freq: Every day | ORAL | 1 refills | Status: DC

## 2022-04-22 NOTE — Progress Notes (Signed)
NST

## 2022-04-22 NOTE — Progress Notes (Signed)
   PRENATAL VISIT NOTE  Subjective:  Sandra Harding is a 27 y.o. G3P2002 at [redacted]w[redacted]d being seen today for ongoing prenatal care.  She is currently monitored for the following issues for this high-risk pregnancy and has History of migraine; Cystic fibrosis carrier; Gestational hypertension; Supervision of normal pregnancy; Intractable hemiplegic migraine without status migrainosus; PFO (patent foramen ovale); and Impetigo on their problem list.  Patient reports headache (has ben problem the whole pregnancy) Contractions: Not present. Vag. Bleeding: None.  Movement: Present. Denies leaking of fluid.   The following portions of the patient's history were reviewed and updated as appropriate: allergies, current medications, past family history, past medical history, past social history, past surgical history and problem list.   Objective:   Vitals:   04/22/22 0922  BP: 122/71  Pulse: 75  Weight: 88.5 kg    Fetal Status: Fetal Heart Rate (bpm): 145   Movement: Present     General:  Alert, oriented and cooperative. Patient is in no acute distress.  Skin: Skin is warm and dry. No rash noted.   Cardiovascular: Normal heart rate noted  Respiratory: Normal respiratory effort, no problems with respiration noted  Abdomen: Soft, gravid, appropriate for gestational age.  Pain/Pressure: Absent     Pelvic: Cervical exam deferred        Extremities: Normal range of motion.  Edema: Trace  Mental Status: Normal mood and affect. Normal behavior. Normal judgment and thought content.   Assessment and Plan:  Pregnancy: G3P2002 at [redacted]w[redacted]d 1. Supervision of other normal pregnancy, antepartum   2.  GHTN One elevated systolic over the past week Antenatal testing 2x week testing at [facility] Headaches have been present the entire pregnancy--none worse now.  Trigger point injection into right trap and under right occipital condyle with 3 cc 1 percent lidocaine.  Flexeril for muscle tension.  Patient  understands that a worsening headache could be a sign of severe preeclampsia.  Patient advised to take blood pressure daily.  If headache worsens to please call the office.  Preterm labor symptoms and general obstetric precautions including but not limited to vaginal bleeding, contractions, leaking of fluid and fetal movement were reviewed in detail with the patient. Please refer to After Visit Summary for other counseling recommendations.   No follow-ups on file.  Future Appointments  Date Time Provider [place]  04/29/2022  9:30 AM Janyth Pupa, DO [facility]  05/06/2022  9:50 AM Guss Bunde, MD [facility]  05/13/2022  9:50 AM Guss Bunde, MD [facility]  05/20/2022  9:30 AM Inez Catalina, MD [facility]    Silas [place], MD

## 2022-04-23 ENCOUNTER — Telehealth (HOSPITAL_COMMUNITY): Payer: Self-pay | Admitting: *Deleted

## 2022-04-23 ENCOUNTER — Encounter (HOSPITAL_COMMUNITY): Payer: Self-pay | Admitting: *Deleted

## 2022-04-23 NOTE — Telephone Encounter (Signed)
Preadmission screen  

## 2022-04-24 ENCOUNTER — Other Ambulatory Visit: Payer: Self-pay | Admitting: Advanced Practice Midwife

## 2022-04-25 ENCOUNTER — Other Ambulatory Visit (INDEPENDENT_AMBULATORY_CARE_PROVIDER_SITE_OTHER): Payer: Medicaid Other

## 2022-04-25 ENCOUNTER — Ambulatory Visit (INDEPENDENT_AMBULATORY_CARE_PROVIDER_SITE_OTHER): Payer: Medicaid Other | Admitting: *Deleted

## 2022-04-25 DIAGNOSIS — O133 Gestational [pregnancy-induced] hypertension without significant proteinuria, third trimester: Secondary | ICD-10-CM

## 2022-04-25 DIAGNOSIS — O139 Gestational [pregnancy-induced] hypertension without significant proteinuria, unspecified trimester: Secondary | ICD-10-CM

## 2022-04-25 DIAGNOSIS — Z3A36 36 weeks gestation of pregnancy: Secondary | ICD-10-CM

## 2022-04-25 DIAGNOSIS — Z3480 Encounter for supervision of other normal pregnancy, unspecified trimester: Secondary | ICD-10-CM

## 2022-04-25 NOTE — Progress Notes (Signed)
AFI-   NML limits and NST is reactive   Pt has a rtn appt on Monday

## 2022-04-26 ENCOUNTER — Other Ambulatory Visit: Payer: Self-pay | Admitting: Advanced Practice Midwife

## 2022-04-26 DIAGNOSIS — O133 Gestational [pregnancy-induced] hypertension without significant proteinuria, third trimester: Secondary | ICD-10-CM

## 2022-04-27 ENCOUNTER — Encounter (HOSPITAL_COMMUNITY): Payer: Self-pay | Admitting: Family Medicine

## 2022-04-27 ENCOUNTER — Inpatient Hospital Stay (HOSPITAL_COMMUNITY)
Admission: AD | Admit: 2022-04-27 | Discharge: 2022-04-27 | Disposition: A | Discharge status: Home / Self Care | Payer: Medicaid Other | Attending: Family Medicine | Admitting: Family Medicine

## 2022-04-27 ENCOUNTER — Inpatient Hospital Stay (HOSPITAL_BASED_OUTPATIENT_CLINIC_OR_DEPARTMENT_OTHER): Payer: Medicaid Other

## 2022-04-27 DIAGNOSIS — Z3A36 36 weeks gestation of pregnancy: Secondary | ICD-10-CM | POA: Diagnosis not present

## 2022-04-27 DIAGNOSIS — R220 Localized swelling, mass and lump, head: Secondary | ICD-10-CM | POA: Diagnosis not present

## 2022-04-27 DIAGNOSIS — O368130 Decreased fetal movements, third trimester, not applicable or unspecified: Secondary | ICD-10-CM | POA: Diagnosis present

## 2022-04-27 DIAGNOSIS — O113 Pre-existing hypertension with pre-eclampsia, third trimester: Secondary | ICD-10-CM | POA: Diagnosis not present

## 2022-04-27 DIAGNOSIS — O133 Gestational [pregnancy-induced] hypertension without significant proteinuria, third trimester: Secondary | ICD-10-CM | POA: Diagnosis not present

## 2022-04-27 DIAGNOSIS — O10913 Unspecified pre-existing hypertension complicating pregnancy, third trimester: Secondary | ICD-10-CM | POA: Insufficient documentation

## 2022-04-27 DIAGNOSIS — R519 Headache, unspecified: Secondary | ICD-10-CM | POA: Diagnosis not present

## 2022-04-27 DIAGNOSIS — G8929 Other chronic pain: Secondary | ICD-10-CM

## 2022-04-27 LAB — COMPREHENSIVE METABOLIC PANEL
ALT: 16 U/L (ref 0–44)
AST: 21 U/L (ref 15–41)
Albumin: 2.2 g/dL — ABNORMAL LOW (ref 3.5–5.0)
Alkaline Phosphatase: 156 U/L — ABNORMAL HIGH (ref 38–126)
Anion gap: 12 (ref 5–15)
BUN: 8 mg/dL (ref 6–20)
CO2: 19 mmol/L — ABNORMAL LOW (ref 22–32)
Calcium: 9.6 mg/dL (ref 8.9–10.3)
Chloride: 103 mmol/L (ref 98–111)
Creatinine, Ser: 0.68 mg/dL (ref 0.44–1.00)
GFR, Estimated: >60 mL/min (ref >60)
Glucose, Bld: 108 mg/dL — ABNORMAL HIGH (ref 70–99)
Potassium: 3.7 mmol/L (ref 3.5–5.1)
Sodium: 134 mmol/L — ABNORMAL LOW (ref 135–145)
Total Bilirubin: 0.5 mg/dL (ref 0.3–1.2)
Total Protein: 6.2 g/dL — ABNORMAL LOW (ref 6.5–8.1)

## 2022-04-27 LAB — CBC WITH DIFFERENTIAL/PLATELET
Abs Immature Granulocytes: 0.04 10*3/uL (ref 0.00–0.07)
Basophils Absolute: 0 10*3/uL (ref 0.0–0.1)
Basophils Relative: 0 %
Eosinophils Absolute: 0.1 10*3/uL (ref 0.0–0.5)
Eosinophils Relative: 2 %
HCT: 30.6 % — ABNORMAL LOW (ref 36.0–46.0)
Hemoglobin: 10.8 g/dL — ABNORMAL LOW (ref 12.0–15.0)
Immature Granulocytes: 1 %
Lymphocytes Relative: 21 %
Lymphs Abs: 1.6 10*3/uL (ref 0.7–4.0)
MCH: 31.3 pg (ref 26.0–34.0)
MCHC: 35.3 g/dL (ref 30.0–36.0)
MCV: 88.7 fL (ref 80.0–100.0)
Monocytes Absolute: 0.4 10*3/uL (ref 0.1–1.0)
Monocytes Relative: 6 %
Neutro Abs: 5.2 10*3/uL (ref 1.7–7.7)
Neutrophils Relative %: 70 %
Platelets: 230 10*3/uL (ref 150–400)
RBC: 3.45 MIL/uL — ABNORMAL LOW (ref 3.87–5.11)
RDW: 11.8 % (ref 11.5–15.5)
WBC: 7.4 10*3/uL (ref 4.0–10.5)
nRBC: 0 % (ref 0.0–0.2)

## 2022-04-27 LAB — PROTEIN / CREATININE RATIO, URINE
Creatinine, Urine: 75 mg/dL
Total Protein, Urine: <6 mg/dL

## 2022-04-27 NOTE — MAU Provider Note (Signed)
History     CSN: [id number]  Arrival date and time: 04/27/22 1038   Event Date/Time   First Provider Initiated Contact with Patient 04/27/22 1123      Chief Complaint  Patient presents with   Decreased Fetal Movement   Headache   Hypertension   Facial Swelling   Sandra Harding , a  27 y.o. [id number] at 3120w3d presents to MAU with complaints of elevated BPs at home. Patient states she took her BP this morning and results were 159/99 and 157/102 on the re-check. She states she took it first thing this AM. She denies epigastric pain Leaking of fluid and contractions. But endorses on-going blurred vision and a headache. She states she just got lidocaine injections for her headache last week. She states they have improved but not gone away. She currently rates pain a 2/10 and denies other methods for tempting to relieve. She also reports hand and facial swelling yesterday. She states swelling has resolved since then. She also reports decreased fetal movement. She states she does feel baby but not often and "not for as long." She states she has not been doing fetal kick counts.          OB History     Gravida  3   Para  2   Term  2   Preterm      AB      Living  2      SAB      IAB      Ectopic      Multiple  0   Live Births  2           Past Medical History:  Diagnosis Date   Headache    PFO (patent foramen ovale)    Pregnancy induced hypertension    Vitamin B 12 deficiency     Past Surgical History:  Procedure Laterality Date   CESAREAN SECTION N/A 09/27/2013   Procedure: CESAREAN SECTION;  Surgeon: Lesly DukesKelly H Leggett, MD;  Location: [facility];  Service: Obstetrics;  Laterality: N/A;    Family History  Problem Relation Age of Onset   Cancer Maternal Grandfather    Diabetes Paternal Aunt    Heart attack Maternal Grandmother    Alzheimer's disease Paternal Grandfather    Cancer Maternal Uncle     Social History   Tobacco Use   Smoking status: Former    Smokeless tobacco: Never  Building services engineerVaping Use   Vaping Use: Former  Substance Use Topics   Alcohol use: No   Drug use: Not Currently    Types: Marijuana    Comment: last use seep 2023    Allergies:  Allergies  Allergen Reactions   Sulfa Antibiotics Hives and Rash    Medications Prior to Admission  Medication Sig Dispense Refill Last Dose   aspirin EC 81 MG tablet Take 1 tablet (81 mg total) by mouth daily. Swallow whole. 30 tablet 12 04/26/2022   cyanocobalamin (VITAMIN B12) 1000 MCG tablet Take 1,000 mcg by mouth daily.   04/27/2022   mupirocin ointment (BACTROBAN) 2 % Apply 1 Application topically 2 (two) times daily. 22 g 0 Past Week   Prenatal 27-1 MG TABS Take 1 tablet by mouth daily.   04/27/2022   cyclobenzaprine (FLEXERIL) 10 MG tablet Take 1 tablet (10 mg total) by mouth at bedtime. 30 tablet 1 Unknown   fluticasone (FLONASE) 50 MCG/ACT nasal spray Place 1 spray into both nostrils daily. 11.1 mL 2 Unknown  Review of Systems  Constitutional:  Negative for chills, fatigue and fever.  Eyes:  Positive for visual disturbance. Negative for pain.  Respiratory:  Negative for apnea, shortness of breath and wheezing.   Cardiovascular:  Negative for chest pain and palpitations.  Gastrointestinal:  Negative for abdominal pain, constipation, diarrhea, nausea and vomiting.  Genitourinary:  Negative for difficulty urinating, dysuria, pelvic pain, vaginal bleeding, vaginal discharge and vaginal pain.  Musculoskeletal:  Positive for joint swelling. Negative for back pain.  Neurological:  Positive for headaches. Negative for dizziness, seizures, weakness, light-headedness and numbness.  Psychiatric/Behavioral:  Negative for suicidal ideas.    Physical Exam   Blood pressure 125/81, pulse 79, temperature 97.7 F (36.5 C), temperature source Oral, resp. rate 17, height 5\' 2"  (1.575 m), weight 90.4 kg, last menstrual period 08/15/2021, SpO2 99 %.  Physical Exam Vitals and nursing note reviewed.   Constitutional:      General: She is not in acute distress.    Appearance: Normal appearance.  HENT:     Head: Normocephalic.  Cardiovascular:     Rate and Rhythm: Normal rate.  Pulmonary:     Effort: Pulmonary effort is normal.  Musculoskeletal:     Cervical back: Normal range of motion.  Skin:    General: Skin is warm and dry.     Capillary Refill: Capillary refill takes 2 to 3 seconds.  Neurological:     Mental Status: She is alert and oriented to person, place, and time.     GCS: GCS eye subscore is 4. GCS verbal subscore is 5. GCS motor subscore is 6.  Psychiatric:        Mood and Affect: Mood normal.    FHT: 140bpm with moderate variability. 15x15 accels present. No decels.  Toco: occasional contractions (patient unaware)   MAU Course  Procedures Orders Placed This Encounter  Procedures   [place] MFM FETAL BPP WO NON STRESS   CBC with Differential/Platelet   Comprehensive metabolic panel   Protein / creatinine ratio, urine   Results for orders placed or performed during the hospital encounter of 04/27/22 (from the past 24 hour(s))  CBC with Differential/Platelet     Status: Abnormal   Collection Time: 04/27/22 11:11 AM  Result Value Ref Range   WBC 7.4 4.0 - 10.5 K/uL   RBC 3.45 (L) 3.87 - 5.11 MIL/uL   Hemoglobin 10.8 (L) 12.0 - 15.0 g/dL   HCT 26.2 (L) 03.5 - 59.7 %   MCV 88.7 80.0 - 100.0 fL   MCH 31.3 26.0 - 34.0 pg   MCHC 35.3 30.0 - 36.0 g/dL   RDW 41.6 38.4 - 53.6 %   Platelets 230 150 - 400 K/uL   nRBC 0.0 0.0 - 0.2 %   Neutrophils Relative % 70 %   Neutro Abs 5.2 1.7 - 7.7 K/uL   Lymphocytes Relative 21 %   Lymphs Abs 1.6 0.7 - 4.0 K/uL   Monocytes Relative 6 %   Monocytes Absolute 0.4 0.1 - 1.0 K/uL   Eosinophils Relative 2 %   Eosinophils Absolute 0.1 0.0 - 0.5 K/uL   Basophils Relative 0 %   Basophils Absolute 0.0 0.0 - 0.1 K/uL   Immature Granulocytes 1 %   Abs Immature Granulocytes 0.04 0.00 - 0.07 K/uL  Comprehensive metabolic panel      Status: Abnormal   Collection Time: 04/27/22 11:11 AM  Result Value Ref Range   Sodium 134 (L) 135 - 145 mmol/L   Potassium 3.7 3.5 -  5.1 mmol/L   Chloride 103 98 - 111 mmol/L   CO2 19 (L) 22 - 32 mmol/L   Glucose, Bld 108 (H) 70 - 99 mg/dL   BUN 8 6 - 20 mg/dL   Creatinine, Ser 2.02 0.44 - 1.00 mg/dL   Calcium 9.6 8.9 - 54.2 mg/dL   Total Protein 6.2 (L) 6.5 - 8.1 g/dL   Albumin 2.2 (L) 3.5 - 5.0 g/dL   AST 21 15 - 41 U/L   ALT 16 0 - 44 U/L   Alkaline Phosphatase 156 (H) 38 - 126 U/L   Total Bilirubin 0.5 0.3 - 1.2 mg/dL   GFR, Estimated >70 >62 mL/min   Anion gap 12 5 - 15  Protein / creatinine ratio, urine     Status: None   Collection Time: 04/27/22 11:35 AM  Result Value Ref Range   Creatinine, Urine 75 mg/dL   Total Protein, Urine <6 mg/dL   Protein Creatinine Ratio        0.00 - 0.15 mg/mg[Cre]   Patient Vitals for the past 24 hrs:  BP Temp Temp src Pulse Resp SpO2 Height Weight  04/27/22 1228 121/83 -- -- 65 17 -- -- --  04/27/22 1146 125/88 -- -- 82 -- 97 % -- --  04/27/22 1131 124/89 -- -- 80 17 -- -- --  04/27/22 1116 125/81 -- -- 79 17 -- -- --  04/27/22 1106 134/80 -- -- 88 17 -- -- --  04/27/22 1052 133/86 97.7 F (36.5 C) Oral 68 18 99 % 5\' 2"  (1.575 m) 90.4 kg     MDM - BPs cycling and patient provided with a fetal kick count clicker.  - Liver Enzymes normal  - CBC normal. Hgb slightly improved from 2 weeks ago. Low suspicion for HELLP  - PCR normal. Low suspicion for PReE - PReLim [place] reports: Normal AFI, movent breathing and tone visualized. BPP 8/8 with reactive NST 10/10 - Patient hit fetal clicker >15 times in MAU  - BPs normal in MAU  - Plan for discharge.   Assessment and Plan   1. Gestational hypertension, third trimester   2. [redacted] weeks gestation of pregnancy   3. Chronic nonintractable headache, unspecified headache type    - Reviewed signs and symptoms of preE.  - Worsening signs and return precautions reviewed - Patient has appointment  on Monday, recommended to keep appointment. - IOL scheduled for Wednesday for Ghtn  - FHT appropriate for gestational age.  - Routine labor precautions reviewed.  - Patient discharged home in stable condition and may return to MAU as needed.   Claudette Head, MSN CNM  04/27/2022, 11:24 AM

## 2022-04-27 NOTE — MAU Note (Signed)
Sandra Harding is a 27 y.o. at [redacted]w[redacted]d here in MAU reporting: is scheduled for induction on Wed for [facility]. Was 159/99 and 157/102 this morning. Reports HA (recev'd lidocaine injections for HA this pas wk), reports +blurred vision, denies epigastric pain. Reports increase in swelling in hands and face.  Denies bleeding or LOF, reports decreased FM, less big movements.   Onset of complaint: yesterday Pain score: 2 Vitals:   04/27/22 1052  BP: 133/86  Pulse: 68  Resp: 18  Temp: 97.7 F (36.5 C)  SpO2: 99%     FHT:156 Lab orders placed from triage:  urine collected

## 2022-04-29 ENCOUNTER — Other Ambulatory Visit (HOSPITAL_COMMUNITY)
Admission: RE | Admit: 2022-04-29 | Discharge: 2022-04-29 | Disposition: A | Discharge status: Home / Self Care | Payer: Medicaid Other | Source: Ambulatory Visit | Attending: Obstetrics & Gynecology | Admitting: Obstetrics & Gynecology

## 2022-04-29 ENCOUNTER — Encounter: Payer: Self-pay | Admitting: Obstetrics & Gynecology

## 2022-04-29 ENCOUNTER — Ambulatory Visit (INDEPENDENT_AMBULATORY_CARE_PROVIDER_SITE_OTHER): Payer: Medicaid Other | Admitting: Obstetrics & Gynecology

## 2022-04-29 VITALS — BP 130/82 | HR 76 | Wt 199.0 lb

## 2022-04-29 DIAGNOSIS — Z3A36 36 weeks gestation of pregnancy: Secondary | ICD-10-CM

## 2022-04-29 DIAGNOSIS — Z3480 Encounter for supervision of other normal pregnancy, unspecified trimester: Secondary | ICD-10-CM | POA: Diagnosis present

## 2022-04-29 DIAGNOSIS — O133 Gestational [pregnancy-induced] hypertension without significant proteinuria, third trimester: Secondary | ICD-10-CM

## 2022-04-29 NOTE — Progress Notes (Signed)
HIGH-RISK PREGNANCY VISIT Patient name: Sandra Harding MRN [id number]  Date of birth: Sep 24, 1995 Chief Complaint:   Routine Prenatal Visit  History of Present Illness:   Sandra Harding is a 27 y.o. G63P2002 female at [redacted]w[redacted]d with an Estimated Date of Delivery: 05/22/22 being seen today for ongoing management of a high-risk pregnancy complicated by:  -Gestational HTN No meds, BP stable, asymptomatic today  Today she reports no complaints.   Contractions: Not present. Vag. Bleeding: None.  Movement: Present. denies leaking of fluid.      10/24/2021    9:12 AM 05/12/2013    9:06 AM  Depression screen PHQ 2/9  Decreased Interest 0 0  Down, Depressed, Hopeless 0 0  PHQ - 2 Score 0 0  Altered sleeping 0   Tired, decreased energy 3   Change in appetite 3   Feeling bad or failure about yourself  0   Trouble concentrating 0   Moving slowly or fidgety/restless 0   Suicidal thoughts 0   PHQ-9 Score 6   Difficult doing work/chores Not difficult at all      Current Outpatient Medications  Medication Instructions   aspirin EC 81 mg, Oral, Daily, Swallow whole.   cyanocobalamin (VITAMIN B12) 1,000 mcg, Oral, Daily   cyclobenzaprine (FLEXERIL) 10 mg, Oral, Daily at bedtime   fluticasone (FLONASE) 50 MCG/ACT nasal spray 1 spray, Each Nare, Daily   mupirocin ointment (BACTROBAN) 2 % 1 Application, Topical, 2 times daily   Prenatal 27-1 MG TABS 1 tablet, Oral, Daily     Review of Systems:   Pertinent items are noted in HPI Denies abnormal vaginal discharge w/ itching/odor/irritation, headaches, visual changes, shortness of breath, chest pain, abdominal pain, severe nausea/vomiting, or problems with urination or bowel movements unless otherwise stated above. Pertinent History Reviewed:  Reviewed past medical,surgical, social, obstetrical and family history.  Reviewed problem list, medications and allergies. Physical Assessment:   Vitals:   04/29/22 0925  BP: 130/82  Pulse: 76   Weight: 199 lb (90.3 kg)  Body mass index is 36.4 kg/m.           Physical Examination:   General appearance: alert, well appearing, and in no distress  Mental status: normal mood, behavior, speech, dress, motor activity, and thought processes  Skin: warm & dry   Extremities: Edema: Trace    Cardiovascular: normal heart rate noted  Respiratory: normal respiratory effort, no distress  Abdomen: gravid, soft, non-tender  Pelvic: Cervical exam deferred - declined exam.  Vaginal swabs obtained        Fetal Status:     Movement: Present    Fetal Surveillance Testing today: doppler   Chaperone:  Mariel Aloe     No results found for this or any previous visit (from the past 24 hour(s)).   Assessment & Plan:  High-risk pregnancy: G3P2002 at [redacted]w[redacted]d with an Estimated Date of Delivery: 05/22/22   1) Gestational hypertension -Scheduled for induction on Wednesday -Reviewed precautions  2) contraceptive management Desires tubal ligation consent completed today Plan to schedule as interval postpartum  Meds: No orders of the defined types were placed in this encounter.   Labs/procedures today: GC/C, GBS  Treatment Plan: Induction scheduled follow-up in 1 week postpartum for BP check  Reviewed: Term labor symptoms and general obstetric precautions including but not limited to vaginal bleeding, contractions, leaking of fluid and fetal movement were reviewed in detail with the patient.  All questions were answered. Pt has home bp cuff. Check bp  weekly, let us know if >140/90.   Follow-up: No follow-ups on file.   Future Appointments  Date Time Provider Department Center  05/01/2022  7:15 AM [facility] SCHED ROOM [facility] None    Orders Placed This Encounter  Procedures   Culture, beta strep (group b only)    Myna Hidalgo, DO Attending Obstetrician & Gynecologist, Faculty Practice Center for [employer], [facility]

## 2022-04-30 ENCOUNTER — Other Ambulatory Visit: Payer: Self-pay | Admitting: Women's Health

## 2022-04-30 LAB — CERVICOVAGINAL ANCILLARY ONLY
Chlamydia: NEGATIVE
Comment: NEGATIVE
Comment: NORMAL
Neisseria Gonorrhea: NEGATIVE

## 2022-05-01 ENCOUNTER — Inpatient Hospital Stay (HOSPITAL_COMMUNITY): Payer: Medicaid Other

## 2022-05-01 ENCOUNTER — Inpatient Hospital Stay (HOSPITAL_COMMUNITY): Payer: Medicaid Other | Admitting: Anesthesiology

## 2022-05-01 ENCOUNTER — Inpatient Hospital Stay (HOSPITAL_COMMUNITY)
Admission: RE | Admit: 2022-05-01 | Discharge: 2022-05-03 | DRG: 807 | Disposition: A | Discharge status: Home / Self Care | Payer: Medicaid Other | Attending: Obstetrics and Gynecology | Admitting: Obstetrics and Gynecology

## 2022-05-01 ENCOUNTER — Other Ambulatory Visit: Payer: Self-pay

## 2022-05-01 DIAGNOSIS — Z87891 Personal history of nicotine dependence: Secondary | ICD-10-CM | POA: Diagnosis not present

## 2022-05-01 DIAGNOSIS — O34219 Maternal care for unspecified type scar from previous cesarean delivery: Secondary | ICD-10-CM | POA: Diagnosis present

## 2022-05-01 DIAGNOSIS — Z7982 Long term (current) use of aspirin: Secondary | ICD-10-CM | POA: Diagnosis not present

## 2022-05-01 DIAGNOSIS — O133 Gestational [pregnancy-induced] hypertension without significant proteinuria, third trimester: Secondary | ICD-10-CM

## 2022-05-01 DIAGNOSIS — O134 Gestational [pregnancy-induced] hypertension without significant proteinuria, complicating childbirth: Secondary | ICD-10-CM | POA: Diagnosis present

## 2022-05-01 DIAGNOSIS — Z141 Cystic fibrosis carrier: Secondary | ICD-10-CM | POA: Diagnosis not present

## 2022-05-01 DIAGNOSIS — Z3A37 37 weeks gestation of pregnancy: Secondary | ICD-10-CM | POA: Diagnosis not present

## 2022-05-01 DIAGNOSIS — O139 Gestational [pregnancy-induced] hypertension without significant proteinuria, unspecified trimester: Principal | ICD-10-CM | POA: Diagnosis present

## 2022-05-01 DIAGNOSIS — Z3490 Encounter for supervision of normal pregnancy, unspecified, unspecified trimester: Secondary | ICD-10-CM

## 2022-05-01 DIAGNOSIS — Z98891 History of uterine scar from previous surgery: Secondary | ICD-10-CM

## 2022-05-01 DIAGNOSIS — O34211 Maternal care for low transverse scar from previous cesarean delivery: Secondary | ICD-10-CM | POA: Diagnosis not present

## 2022-05-01 LAB — CBC
HCT: 31.1 % — ABNORMAL LOW (ref 36.0–46.0)
HCT: 31.1 % — ABNORMAL LOW (ref 36.0–46.0)
Hemoglobin: 10.7 g/dL — ABNORMAL LOW (ref 12.0–15.0)
Hemoglobin: 10.4 g/dL — ABNORMAL LOW (ref 12.0–15.0)
MCH: 30.7 pg (ref 26.0–34.0)
MCH: 30.3 pg (ref 26.0–34.0)
MCHC: 34.4 g/dL (ref 30.0–36.0)
MCHC: 33.4 g/dL (ref 30.0–36.0)
MCV: 89.1 fL (ref 80.0–100.0)
MCV: 90.7 fL (ref 80.0–100.0)
Platelets: 228 10*3/uL (ref 150–400)
Platelets: 225 10*3/uL (ref 150–400)
RBC: 3.49 MIL/uL — ABNORMAL LOW (ref 3.87–5.11)
RBC: 3.43 MIL/uL — ABNORMAL LOW (ref 3.87–5.11)
RDW: 11.8 % (ref 11.5–15.5)
RDW: 11.9 % (ref 11.5–15.5)
WBC: 7.9 10*3/uL (ref 4.0–10.5)
WBC: 8.8 10*3/uL (ref 4.0–10.5)
nRBC: 0 % (ref 0.0–0.2)
nRBC: 0 % (ref 0.0–0.2)

## 2022-05-01 LAB — COMPREHENSIVE METABOLIC PANEL
ALT: 15 U/L (ref 0–44)
AST: 23 U/L (ref 15–41)
Albumin: 2.4 g/dL — ABNORMAL LOW (ref 3.5–5.0)
Alkaline Phosphatase: 159 U/L — ABNORMAL HIGH (ref 38–126)
Anion gap: 10 (ref 5–15)
BUN: 11 mg/dL (ref 6–20)
CO2: 18 mmol/L — ABNORMAL LOW (ref 22–32)
Calcium: 9.6 mg/dL (ref 8.9–10.3)
Chloride: 104 mmol/L (ref 98–111)
Creatinine, Ser: 0.69 mg/dL (ref 0.44–1.00)
GFR, Estimated: >60 mL/min (ref >60)
Glucose, Bld: 82 mg/dL (ref 70–99)
Potassium: 3.8 mmol/L (ref 3.5–5.1)
Sodium: 132 mmol/L — ABNORMAL LOW (ref 135–145)
Total Bilirubin: 0.4 mg/dL (ref 0.3–1.2)
Total Protein: 6.5 g/dL (ref 6.5–8.1)

## 2022-05-01 LAB — TYPE AND SCREEN
ABO/RH(D): A POS
Antibody Screen: NEGATIVE

## 2022-05-01 LAB — GROUP B STREP BY PCR: Group B strep by PCR: NEGATIVE

## 2022-05-01 LAB — PROTEIN / CREATININE RATIO, URINE
Creatinine, Urine: 46 mg/dL
Protein Creatinine Ratio: 0.17 mg/mg{Cre} — ABNORMAL HIGH (ref 0.00–0.15)
Total Protein, Urine: 8 mg/dL

## 2022-05-01 LAB — RPR: RPR Ser Ql: NONREACTIVE

## 2022-05-01 MED ORDER — EPHEDRINE 5 MG/ML INJ: 10.0000 mg | INTRAVENOUS | Status: DC | PRN

## 2022-05-01 MED ORDER — ONDANSETRON HCL 4 MG/2ML IJ SOLN: 4.0000 mg | Freq: Four times a day (QID) | INTRAMUSCULAR | Status: DC | PRN

## 2022-05-01 MED ORDER — PHENYLEPHRINE 80 MCG/ML (10ML) SYRINGE FOR IV PUSH (FOR BLOOD PRESSURE SUPPORT): 80.0000 ug | PREFILLED_SYRINGE | INTRAVENOUS | Status: DC | PRN

## 2022-05-01 MED ORDER — DIPHENHYDRAMINE HCL 50 MG/ML IJ SOLN: 12.5000 mg | INTRAMUSCULAR | Status: DC | PRN

## 2022-05-01 MED ORDER — TERBUTALINE SULFATE 1 MG/ML IJ SOLN: 0.2500 mg | Freq: Once | INTRAMUSCULAR | Status: DC | PRN

## 2022-05-01 MED ORDER — OXYCODONE-ACETAMINOPHEN 5-325 MG PO TABS: 2.0000 | ORAL_TABLET | ORAL | Status: DC | PRN

## 2022-05-01 MED ORDER — OXYTOCIN-SODIUM CHLORIDE 30-0.9 UT/500ML-% IV SOLN
2.5000 [IU]/h | INTRAVENOUS | Status: DC
  Administered 2022-05-02: 2.5 [IU]/h via INTRAVENOUS

## 2022-05-01 MED ORDER — LIDOCAINE-EPINEPHRINE (PF) 2 %-1:200000 IJ SOLN
INTRAMUSCULAR | Status: DC | PRN
  Administered 2022-05-01: 5 mL via EPIDURAL

## 2022-05-01 MED ORDER — LACTATED RINGERS IV SOLN: INTRAVENOUS | Status: DC

## 2022-05-01 MED ORDER — OXYCODONE-ACETAMINOPHEN 5-325 MG PO TABS: 1.0000 | ORAL_TABLET | ORAL | Status: DC | PRN

## 2022-05-01 MED ORDER — FENTANYL-BUPIVACAINE-NACL 0.5-0.125-0.9 MG/250ML-% EP SOLN
12.0000 mL/h | EPIDURAL | Status: DC | PRN
  Administered 2022-05-01: 12 mL/h via EPIDURAL
  Filled 2022-05-01: qty 250

## 2022-05-01 MED ORDER — ACETAMINOPHEN 325 MG PO TABS: 650.0000 mg | ORAL_TABLET | ORAL | Status: DC | PRN

## 2022-05-01 MED ORDER — LIDOCAINE HCL (PF) 1 % IJ SOLN: 30.0000 mL | INTRAMUSCULAR | Status: DC | PRN

## 2022-05-01 MED ORDER — LACTATED RINGERS IV SOLN: 500.0000 mL | INTRAVENOUS | Status: DC | PRN

## 2022-05-01 MED ORDER — OXYTOCIN-SODIUM CHLORIDE 30-0.9 UT/500ML-% IV SOLN
1.0000 m[IU]/min | INTRAVENOUS | Status: DC
  Administered 2022-05-01: 2 m[IU]/min via INTRAVENOUS
  Filled 2022-05-01: qty 500

## 2022-05-01 MED ORDER — SOD CITRATE-CITRIC ACID 500-334 MG/5ML PO SOLN: 30.0000 mL | ORAL | Status: DC | PRN

## 2022-05-01 MED ORDER — OXYTOCIN BOLUS FROM INFUSION
333.0000 mL | Freq: Once | INTRAVENOUS | Status: AC
  Administered 2022-05-02: 333 mL via INTRAVENOUS

## 2022-05-01 MED ORDER — FENTANYL CITRATE (PF) 100 MCG/2ML IJ SOLN: 100.0000 ug | INTRAMUSCULAR | Status: DC | PRN

## 2022-05-01 MED ORDER — LACTATED RINGERS IV SOLN
500.0000 mL | Freq: Once | INTRAVENOUS | Status: DC
  Administered 2022-05-01: 100 mL via INTRAVENOUS

## 2022-05-01 NOTE — Progress Notes (Signed)
Labor Progress Note  Sandra Harding is a 27 y.o. G3P2002 at [redacted]w[redacted]d presented for IOL gHTN  S: pt doing well  O:  BP (!) 143/85   Pulse (!) 59   Temp 97.7 F (36.5 C) (Oral)   Resp 16   Ht 5\' 2"  (1.575 m)   Wt 92.4 kg   LMP 08/15/2021   BMI 37.26 kg/m  EFM: 140 bpm/Moderate variability/ 15x15 accels/ None decels  CVE: Dilation: 2 Effacement (%): 50 Cervical Position: Posterior Station: -3 Presentation: Vertex Exam by:: Patrcia Dolly   A&P: 27 y.o. [id number] [redacted]w[redacted]d  here for IOL as above  #Labor: Progressing well. FB placed, filled with 40cc. Cont pit at 49mu/min. Encouraged to ambulate #Pain: Nitrous oxide, Family/Friend support, and PO/IV pain meds #FWB: CAT 1 #GBS negative #gHTN: PreE labs nml. BP elevated, not severe range. CTM  Myrtie Hawk, DO FMOB Fellow, Faculty practice [facility], Center for [facility] 05/01/22  2:49 PM

## 2022-05-01 NOTE — Anesthesia Procedure Notes (Signed)
Epidural Patient location during procedure: OB Start time: 05/01/2022 8:20 PM End time: 05/01/2022 8:35 PM  Staffing Anesthesiologist: Elmer Picker, MD Performed: anesthesiologist   Preanesthetic Checklist Completed: patient identified, IV checked, risks and benefits discussed, monitors and equipment checked, pre-op evaluation and timeout performed  Epidural Patient position: sitting Prep: DuraPrep and site prepped and draped Patient monitoring: continuous pulse ox, blood pressure, heart rate and cardiac monitor Approach: midline Location: L3-L4 Injection technique: LOR air  Needle:  Needle type: Tuohy  Needle gauge: 17 G Needle length: 9 cm Needle insertion depth: 7 cm Catheter type: closed end flexible Catheter size: 19 Gauge Catheter at skin depth: 12 cm Test dose: negative  Assessment Sensory level: T8 Events: blood not aspirated, no cerebrospinal fluid, injection not painful, no injection resistance, no paresthesia and negative IV test  Additional Notes Patient identified. Risks/Benefits/Options discussed with patient including but not limited to bleeding, infection, nerve damage, paralysis, failed block, incomplete pain control, headache, blood pressure changes, nausea, vomiting, reactions to medication both or allergic, itching and postpartum back pain. Confirmed with bedside nurse the patient's most recent platelet count. Confirmed with patient that they are not currently taking any anticoagulation, have any bleeding history or any family history of bleeding disorders. Patient expressed understanding and wished to proceed. All questions were answered. Sterile technique was used throughout the entire procedure. Please see nursing notes for vital signs. Test dose was given through epidural catheter and negative prior to continuing to dose epidural or start infusion. Warning signs of high block given to the patient including shortness of breath, tingling/numbness in hands,  complete motor block, or any concerning symptoms with instructions to call for help. Patient was given instructions on fall risk and not to get out of bed. All questions and concerns addressed with instructions to call with any issues or inadequate analgesia.    2 attempts. First attempt at L4/5 with paresthesia on the left. Catheter removed. Second attempt at L4/5 with LOR at 7cm. Catheter inserted and left at 12cm at skin. No paresthesia. Pt tolerated well. VSS. Reason for block:procedure for pain

## 2022-05-01 NOTE — Progress Notes (Signed)
Labor Progress Note  Sandra Harding is a 27 y.o. G3P2002 at [redacted]w[redacted]d presented for IOL gHTN  S: No acute concerns.   O:  BP (!) 139/96   Pulse 73   Temp 98 F (36.7 C) (Oral)   Resp 20   Ht 5\' 2"  (1.575 m)   Wt 92.4 kg   LMP 08/15/2021   SpO2 99%   BMI 37.26 kg/m  EFM: 135 bpm/Moderate variability/ 15x15 accels/ None decels  CVE: Dilation: 5.5 Effacement (%): 70 Cervical Position: Posterior Station: -3 Presentation: Vertex Exam by:: Dr.Autry Lott   A&P: 27 y.o. [id number] [redacted]w[redacted]d  here for IOL as above  #Labor: Progressing slowly. IUPC placed on 10 of pit continue to titrate up adequately #Pain: Nitrous oxide, Family/Friend support, and PO/IV pain meds #FWB: CAT 1 #GBS negative #gHTN: PreE labs nml. BP elevated, not severe range. CTM  Sandra Jumbo, DO FMOB Fellow, Faculty practice [facility], Center for [facility] 05/01/22  9:32 PM

## 2022-05-01 NOTE — Progress Notes (Signed)
Labor Progress Note  Sandra Harding is a 27 y.o. G3P2002 at [redacted]w[redacted]d presented for IOL gHTN  S: FB out, pt doing well  O:  BP (!) 143/85   Pulse (!) 59   Temp 97.7 F (36.5 C) (Oral)   Resp 16   Ht 5\' 2"  (1.575 m)   Wt 92.4 kg   LMP 08/15/2021   BMI 37.26 kg/m  EFM: 140 bpm/Moderate variability/ 15x15 accels/ None decels  CVE: Dilation: 5 Effacement (%): 60 Cervical Position: Posterior Station: -3 Presentation: Vertex Exam by:: Patrcia Dolly   A&P: 27 y.o. [id number] [redacted]w[redacted]d  here for IOL as above  #Labor: Progressing well. AROM clear, continue pit #Pain: Nitrous oxide, Family/Friend support, and PO/IV pain meds #FWB: CAT 1 #GBS negative #gHTN: PreE labs nml. BP elevated, not severe range. CTM  Myrtie Hawk, DO FMOB Fellow, Faculty practice [facility], Center for [facility] 05/01/22  3:51 PM

## 2022-05-01 NOTE — Anesthesia Preprocedure Evaluation (Signed)
Anesthesia Evaluation  Patient identified by MRN, date of birth, ID band Patient awake    Reviewed: Allergy & Precautions, NPO status , Patient's Chart, lab work & pertinent test results  Airway Mallampati: II  TM Distance: >3 FB Neck ROM: Full    Dental no notable dental hx.    Pulmonary neg pulmonary ROS, former smoker   Pulmonary exam normal breath sounds clear to auscultation       Cardiovascular hypertension (gHTN), Normal cardiovascular exam Rhythm:Regular Rate:Normal     Neuro/Psych  Headaches  negative psych ROS   GI/Hepatic negative GI ROS, Neg liver ROS,,,  Endo/Other  negative endocrine ROS    Renal/GU negative Renal ROS  negative genitourinary   Musculoskeletal negative musculoskeletal ROS (+)    Abdominal   Peds  Hematology negative hematology ROS (+)   Anesthesia Other Findings IOL for gHTN  Reproductive/Obstetrics (+) Pregnancy                             Anesthesia Physical Anesthesia Plan  ASA: 3  Anesthesia Plan: Epidural   Post-op Pain Management:    Induction:   PONV Risk Score and Plan: Treatment may vary due to age or medical condition  Airway Management Planned: Natural Airway  Additional Equipment:   Intra-op Plan:   Post-operative Plan:   Informed Consent: I have reviewed the patients History and Physical, chart, labs and discussed the procedure including the risks, benefits and alternatives for the proposed anesthesia with the patient or authorized representative who has indicated his/her understanding and acceptance.       Plan Discussed with: Anesthesiologist  Anesthesia Plan Comments: (Patient identified. Risks, benefits, options discussed with patient including but not limited to bleeding, infection, nerve damage, paralysis, failed block, incomplete pain control, headache, blood pressure changes, nausea, vomiting, reactions to medication,  itching, and post partum back pain. Confirmed with bedside nurse the patient's most recent platelet count. Confirmed with the patient that they are not taking any anticoagulation, have any bleeding history or any family history of bleeding disorders. Patient expressed understanding and wishes to proceed. All questions were answered. )       Anesthesia Quick Evaluation

## 2022-05-01 NOTE — Plan of Care (Signed)

## 2022-05-01 NOTE — H&P (Signed)
OBSTETRIC ADMISSION HISTORY AND PHYSICAL  Darrelle C Demesa is a 27 y.o. female G69P2002 with IUP at [redacted]w[redacted]d by LMP presenting for IOL gHTN. She reports +FMs, No LOF, no VB, no blurry vision, headaches or peripheral edema, and RUQ pain.  She plans on Breast/bottle feeding. She request interval BTL for birth control. 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   History of migraine 06/27/2017     Nursing Staff Provider  Office Location [place] Dating  05/22/2022, by Last Menstrual Period  [facility] Model [x ] Traditional [ ]  Centering [ ]  Mom-Baby Dyad Anatomy US  Complete & normal  Language  English    Flu Vaccine  10/24/21 Genetic/Carrier Screen  NIPS:  Low risk female AFP:   Negative Horizon:  TDaP Vaccine   02/27/22 Hgb A1C or  GTT Early  Third trimester normal  COVID Vaccine    LAB RESULTS   Rhogam  N/a Blood Type A/RH(D) POSITIVE/-- (12/18 1036) A pos  Baby Feeding Plan Breast then formula after Antibody NO ANTIBODIES DETECTED (12/18 1036)  Contraception Undecided (see 11/20 note)- BTL Rubella 5.12 (12/18 1036)  Circumcision Yes RPR Non Reactive (02/07 0922)   Pediatrician  [facility]  HBsAg NON-REACTIVE (12/18 1036)   Support Person Travis HCVAb  Non-reactive  Prenatal Classes  HIV Non Reactive (02/07 0922)     BTL Consent interval GBS   (For PCN allergy, check sensitivities)   VBAC Consent  Pap Diagnosis  Date Value Ref Range Status  08/03/2020   Final   - Negative for Intraepithelial Lesions or Malignancy  (NILM)  08/03/2020 - Benign reactive/reparative changes  Final         DME Rx [x ] BP cuff [ ]  Weight Scale Waterbirth  [ ]  Class [ ]  Consent [ ]  CNM visit  PHQ9 & GAD7 [x  ] new OB [  ] 28 weeks  [  ] 36 weeks Induction  [ ]  Orders Entered [ ] Foley Y/N   Past Medical History: Past Medical History:  Diagnosis Date   Headache    PFO (patent foramen ovale)    Pregnancy induced hypertension    Vitamin B 12 deficiency     Past Surgical History: Past Surgical History:  Procedure Laterality Date   CESAREAN SECTION N/A 09/27/2013   Procedure: CESAREAN SECTION;  Surgeon: Lesly Dukes, MD;  Location: [facility];  Service: Obstetrics;  Laterality: N/A;    Obstetrical History: OB History     Gravida  3   Para  2   Term  2   Preterm      AB      Living  2      SAB      IAB      Ectopic      Multiple  0   Live Births  2           Social History Social History   Socioeconomic History   Marital status: Single    Spouse name: Not on file   Number of children:  1   Years of education: Not on file   Highest education level: Not on file  Occupational History   Not on file  Tobacco Use   Smoking status: Former   Smokeless tobacco: Never  Vaping Use   Vaping Use: Former  Substance and Sexual Activity   Alcohol use: No   Drug use: Not Currently    Types: Marijuana    Comment: last use seep 2023   Sexual activity: Not Currently    Birth control/protection: None  Other Topics Concern   Not on file  Social History Narrative   Not on file   Social Determinants of Health   Financial Resource Strain: Not on file  Food Insecurity: No Food Insecurity (05/01/2022)   Hunger Vital Sign    Worried About Running Out of Food in the Last Year: Never true    Ran Out of Food in the Last Year: Never true  Transportation Needs: No Transportation Needs (05/01/2022)   PRAPARE - Administrator, Civil Service (Medical): No    Lack of Transportation  (Non-Medical): No  Physical Activity: Not on file  Stress: Not on file  Social Connections: Not on file    Family History: Family History  Problem Relation Age of Onset   Cancer Maternal Grandfather    Diabetes Paternal Aunt    Heart attack Maternal Grandmother    Alzheimer's disease Paternal Grandfather    Cancer Maternal Uncle     Allergies: Allergies  Allergen Reactions   Sulfa Antibiotics Hives and Rash    Medications Prior to Admission  Medication Sig Dispense Refill Last Dose   aspirin EC 81 MG tablet Take 1 tablet (81 mg total) by mouth daily. Swallow whole. 30 tablet 12 Past Week   cyanocobalamin (VITAMIN B12) 1000 MCG tablet Take 1,000 mcg by mouth daily.   05/01/2022   Prenatal 27-1 MG TABS Take 1 tablet by mouth daily.   05/01/2022   cyclobenzaprine (FLEXERIL) 10 MG tablet Take 1 tablet (10 mg total) by mouth at bedtime. (Patient not taking: Reported on 04/29/2022) 30 tablet 1    fluticasone (FLONASE) 50 MCG/ACT nasal spray Place 1 spray into both nostrils daily. (Patient not taking: Reported on 04/29/2022) 11.1 mL 2    mupirocin ointment (BACTROBAN) 2 % Apply 1 Application topically 2 (two) times daily. 22 g 0      Review of Systems   All systems reviewed and negative except as stated in HPI  Blood pressure (!) 149/94, pulse 66, temperature 97.7 F (36.5 C), temperature source Oral, resp. rate 16, height 5\' 2"  (1.575 m), weight 92.4 kg, last menstrual period 08/15/2021. General appearance: alert, cooperative, appears stated age, and no distress Lungs: clear to auscultation bilaterally Heart: regular rate and rhythm Abdomen: soft, non-tender; bowel sounds normal Pelvic: no lesions Extremities: Homans sign is negative, no sign of DVT Presentation: cephalic Fetal monitoringBaseline: 130 bpm, Variability: Good {> 6 bpm), Accelerations: Reactive, and Decelerations: Absent Uterine activity: irregular     Prenatal labs: ABO, Rh: --/--/PENDING (04/10 5974) Antibody:  PENDING (04/10 0803) Rubella: 5.12 (12/18 1036) RPR: Non Reactive (02/07 0922)  HBsAg: NON-REACTIVE (12/18 1036)  HIV: Non Reactive (02/07 0922)  GBS:    1 hr Glucola nml Genetic screening  LR Anatomy US nml  Prenatal Transfer Tool  Maternal Diabetes: No Genetic Screening: Normal Maternal Ultrasounds/Referrals: Normal Fetal Ultrasounds or other Referrals:  None Maternal Substance Abuse:  No Significant Maternal Medications:  None Significant Maternal Lab Results:  Other:  GBS pending Number of Prenatal Visits:greater than 3 verified prenatal visits Other Comments:  None  Results for orders placed or performed during the hospital encounter of 05/01/22 (from the past 24 hour(s))  Type and screen   Collection Time: 05/01/22  8:03 AM  Result Value Ref Range   ABO/RH(D) PENDING    Antibody Screen PENDING    Sample Expiration      05/04/2022,2359 Performed at [facility], 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 G3P2002 at 6429w0d here for IOL gHTN, TOLAC  #Labor:TOLAC consent signed before IOL today. Start pitocin 2x2 until 866mu/min, will attempt FB at next SVE #Pain: Per pt request #FWB: CAT 1 #ID:  GBS pending, no ppx at this time since pt is not high risk #MOF: Breast and bottle #MOC: interval BTL #Circ:  Yes #gHTN: Pre E labs nml, P/C pending. BP elevated, not severe range. CTM  Myrtie HawkJessica Q Mercado-Ortiz, DO  05/01/2022, 8:46 AM

## 2022-05-02 ENCOUNTER — Telehealth (HOSPITAL_COMMUNITY): Payer: Self-pay | Admitting: *Deleted

## 2022-05-02 DIAGNOSIS — O34219 Maternal care for unspecified type scar from previous cesarean delivery: Secondary | ICD-10-CM

## 2022-05-02 DIAGNOSIS — Z3A37 37 weeks gestation of pregnancy: Secondary | ICD-10-CM

## 2022-05-02 DIAGNOSIS — O34211 Maternal care for low transverse scar from previous cesarean delivery: Secondary | ICD-10-CM

## 2022-05-02 DIAGNOSIS — O134 Gestational [pregnancy-induced] hypertension without significant proteinuria, complicating childbirth: Secondary | ICD-10-CM

## 2022-05-02 MED ORDER — ONDANSETRON HCL 4 MG PO TABS: 4.0000 mg | ORAL_TABLET | ORAL | Status: DC | PRN

## 2022-05-02 MED ORDER — IBUPROFEN 600 MG PO TABS
600.0000 mg | ORAL_TABLET | Freq: Four times a day (QID) | ORAL | Status: DC
  Administered 2022-05-02 – 2022-05-03 (×4): 600 mg via ORAL
  Filled 2022-05-02 (×5): qty 1

## 2022-05-02 MED ORDER — COCONUT OIL OIL: 1.0000 | TOPICAL_OIL | Status: DC | PRN

## 2022-05-02 MED ORDER — DIPHENHYDRAMINE HCL 25 MG PO CAPS: 25.0000 mg | ORAL_CAPSULE | Freq: Four times a day (QID) | ORAL | Status: DC | PRN

## 2022-05-02 MED ORDER — ONDANSETRON HCL 4 MG/2ML IJ SOLN: 4.0000 mg | INTRAMUSCULAR | Status: DC | PRN

## 2022-05-02 MED ORDER — ZOLPIDEM TARTRATE 5 MG PO TABS: 5.0000 mg | ORAL_TABLET | Freq: Every evening | ORAL | Status: DC | PRN

## 2022-05-02 MED ORDER — WITCH HAZEL-GLYCERIN EX PADS: 1.0000 | MEDICATED_PAD | CUTANEOUS | Status: DC | PRN

## 2022-05-02 MED ORDER — PRENATAL MULTIVITAMIN CH
1.0000 | ORAL_TABLET | Freq: Every day | ORAL | Status: DC
  Administered 2022-05-02 – 2022-05-03 (×2): 1 via ORAL
  Filled 2022-05-02 (×2): qty 1

## 2022-05-02 MED ORDER — SIMETHICONE 80 MG PO CHEW: 80.0000 mg | CHEWABLE_TABLET | ORAL | Status: DC | PRN

## 2022-05-02 MED ORDER — FUROSEMIDE 20 MG PO TABS
20.0000 mg | ORAL_TABLET | Freq: Every day | ORAL | Status: DC
  Administered 2022-05-02 – 2022-05-03 (×2): 20 mg via ORAL
  Filled 2022-05-02 (×2): qty 1

## 2022-05-02 MED ORDER — DIBUCAINE (PERIANAL) 1 % EX OINT: 1.0000 | TOPICAL_OINTMENT | CUTANEOUS | Status: DC | PRN

## 2022-05-02 MED ORDER — ACETAMINOPHEN 325 MG PO TABS: 650.0000 mg | ORAL_TABLET | ORAL | Status: DC | PRN

## 2022-05-02 MED ORDER — SENNOSIDES-DOCUSATE SODIUM 8.6-50 MG PO TABS
2.0000 | ORAL_TABLET | Freq: Every day | ORAL | Status: DC
  Administered 2022-05-03: 2 via ORAL
  Filled 2022-05-02: qty 2

## 2022-05-02 MED ORDER — BENZOCAINE-MENTHOL 20-0.5 % EX AERO: 1.0000 | INHALATION_SPRAY | CUTANEOUS | Status: DC | PRN

## 2022-05-02 MED ORDER — TETANUS-DIPHTH-ACELL PERTUSSIS 5-2.5-18.5 LF-MCG/0.5 IM SUSY: 0.5000 mL | PREFILLED_SYRINGE | Freq: Once | INTRAMUSCULAR | Status: DC

## 2022-05-02 NOTE — Discharge Summary (Signed)
Postpartum Discharge Summary  Date of Service updated     Patient Name: Sandra Harding DOB: 1995-04-11 MRN: [id number]  Date of admission: 05/01/2022 Delivery date:05/02/2022  Delivering provider: Lavonda Jumbo  Date of discharge: 05/03/2022  Admitting diagnosis: Gestational hypertension [O13.9] Intrauterine pregnancy: [redacted]w[redacted]d     Secondary diagnosis:  Principal Problem:   Gestational hypertension Active Problems:   Hx of vaginal birth after cesarean   Supervision of normal pregnancy   VBAC (vaginal birth after Cesarean)  Additional problems: None    Discharge diagnosis: Gestational Hypertension                                              Post partum procedures: Interval BTL (not PP) Augmentation: AROM, Pitocin, and IP Foley Complications: None  Hospital course: Induction of Labor With Vaginal Delivery   27 y.o. yo G3P2002 at [redacted]w[redacted]d was admitted to the hospital 05/01/2022 for induction of labor.  Indication for induction: Gestational hypertension.  Patient had an labor course that was uncomplicated. Membrane Rupture Time/Date: 3:48 PM ,05/01/2022   Delivery Method:Vaginal, Spontaneous  Episiotomy: None  Lacerations:  1st degree;Periurethral  Details of delivery can be found in separate delivery note.  Patient had a postpartum course complicated bynothing. Patient is discharged home 05/03/22.  Newborn Data: Birth date:05/02/2022  Birth time:3:36 AM  Gender:Female  Living status:Living  Apgars:9 ,9  Weight:3005 g   Magnesium Sulfate received: No BMZ received: No Rhophylac:N/A MMR:N/A T-DaP:Given prenatally Flu: N/A Transfusion:No  Physical exam  Vitals:   05/02/22 1429 05/02/22 1716 05/02/22 2020 05/03/22 0406  BP: 118/66 122/83 129/71 129/76  Pulse: 68 74 82 87  Resp: 16 16 18 16   Temp: 97.7 F (36.5 C) 98 F (36.7 C) 98 F (36.7 C) 98.1 F (36.7 C)  TempSrc: Oral Oral Oral Oral  SpO2:      Weight:      Height:       General: alert, cooperative, and  no distress Lochia: appropriate Uterine Fundus: firm Incision: N/A DVT Evaluation: No evidence of DVT seen on physical exam. Labs: Lab Results  Component Value Date   WBC 8.8 05/01/2022   HGB 10.4 (L) 05/01/2022   HCT 31.1 (L) 05/01/2022   MCV 90.7 05/01/2022   PLT 225 05/01/2022      Latest Ref Rng & Units 05/01/2022    8:04 AM  CMP  Glucose 70 - 99 mg/dL 82   BUN 6 - 20 mg/dL 11   Creatinine 8.67 - 1.00 mg/dL 6.19   Sodium 509 - 326 mmol/L 132   Potassium 3.5 - 5.1 mmol/L 3.8   Chloride 98 - 111 mmol/L 104   CO2 22 - 32 mmol/L 18   Calcium 8.9 - 10.3 mg/dL 9.6   Total Protein 6.5 - 8.1 g/dL 6.5   Total Bilirubin 0.3 - 1.2 mg/dL 0.4   Alkaline Phos 38 - 126 U/L 159   AST 15 - 41 U/L 23   ALT 0 - 44 U/L 15    Edinburgh Score:    05/02/2022    4:16 AM  Edinburgh Postnatal Depression Scale Screening Tool  I have been able to laugh and see the funny side of things. 0  I have looked forward with enjoyment to things. 0  I have blamed myself unnecessarily when things went wrong. 1  I have been anxious or  worried for no good reason. 2  I have felt scared or panicky for no good reason. 0  Things have been getting on top of me. 0  I have been so unhappy that I have had difficulty sleeping. 0  I have felt sad or miserable. 0  I have been so unhappy that I have been crying. 0  The thought of harming myself has occurred to me. 0  Edinburgh Postnatal Depression Scale Total 3     After visit meds:  Allergies as of 05/03/2022       Reactions   Sulfa Antibiotics Hives, Rash        Medication List     STOP taking these medications    aspirin EC 81 MG tablet   cyclobenzaprine 10 MG tablet Commonly known as: FLEXERIL   fluticasone 50 MCG/ACT nasal spray Commonly known as: FLONASE       TAKE these medications    cyanocobalamin 1000 MCG tablet Commonly known as: VITAMIN B12 Take 1,000 mcg by mouth daily.   furosemide 20 MG tablet Commonly known as:  LASIX Take 1 tablet (20 mg total) by mouth daily for 5 days.   ibuprofen 600 MG tablet Commonly known as: ADVIL Take 1 tablet (600 mg total) by mouth every 6 (six) hours.   mupirocin ointment 2 % Commonly known as: BACTROBAN Apply 1 Application topically 2 (two) times daily.   Prenatal 27-1 MG Tabs Take 1 tablet by mouth daily.         Discharge home in stable condition Infant Feeding: Breast Infant Disposition:home with mother Discharge instruction: per After Visit Summary and Postpartum booklet. Activity: Advance as tolerated. Pelvic rest for 6 weeks.  Diet: routine diet Future Appointments: Future Appointments  Date Time Provider Department Center  05/09/2022  9:00 AM [facility] NURSE [facility]  06/11/2022  1:10 PM Sue LushBrown, Savannah L, FNP [facility]   Follow up Visit:  Follow-up Information     [facility]. Schedule an appointment as soon as possible for a visit in 1 week(s).   Specialty: Obstetrics and Gynecology Why: For BP check Contact information: [address], [address] [place] [id number] [phone]                Message sent to FT by Autry-Lott on 05/03/2022  Please schedule this patient for a In person postpartum visit in 6 weeks with the following provider: MD and APP. Additional Postpartum F/U:BP check 1 week  High risk pregnancy complicated by:  gHTN, VBAC Delivery mode:  Vaginal, Spontaneous  Anticipated Birth Control:  Plans Interval BTL   05/03/2022 Wynelle BourgeoisMarie Genever Hentges, CNM

## 2022-05-02 NOTE — Anesthesia Postprocedure Evaluation (Signed)
Anesthesia Post Note  Patient: Sandra Harding  Procedure(s) Performed: AN AD HOC LABOR EPIDURAL     Patient location during evaluation: Mother Baby Anesthesia Type: Epidural Level of consciousness: awake Pain management: satisfactory to patient Vital Signs Assessment: post-procedure vital signs reviewed and stable Respiratory status: spontaneous breathing Cardiovascular status: stable Anesthetic complications: no  No notable events documented.  Last Vitals:  Vitals:   05/02/22 0545 05/02/22 0643  BP: 129/73 120/74  Pulse: 74 77  Resp: 16 17  Temp: 36.8 C 36.7 C  SpO2: 98%     Last Pain:  Vitals:   05/02/22 0643  TempSrc: Oral  PainSc:    Pain Goal: Patients Stated Pain Goal: 3 (05/01/22 2100)                 Cephus Shelling

## 2022-05-02 NOTE — Progress Notes (Addendum)
Labor Progress Note  Sandra Harding is a 27 y.o. G3P2002 at [redacted]w[redacted]d presented for IOL gHTN  S: No acute concerns.   O:  BP 134/89   Pulse 95   Temp 97.7 F (36.5 C) (Oral)   Resp 20   Ht 5\' 2"  (1.575 m)   Wt 92.4 kg   LMP 08/15/2021   SpO2 99%   BMI 37.26 kg/m  EFM: 150bpm/Moderate variability/ 15x15 accels/ Early and occasional variable decel  CVE: Dilation: 6.5 Effacement (%): 80 Cervical Position: Posterior Station: -2 Presentation: Vertex Exam by:: Thomes Cake RN   A&P: 27 y.o. [id number] [redacted]w[redacted]d  here for IOL as above  #Labor: Progressing. Not yet adequate on 20 of pitocin #Pain: Nitrous oxide, Family/Friend support, and PO/IV pain meds #FWB: CAT 1 with periods of Cat II #GBS negative #gHTN: PreE labs nml. BP elevated, not severe range. CTM  Lavonda Jumbo, DO FMOB Fellow, Faculty practice [facility], Center for [facility] 05/02/22  12:37 AM

## 2022-05-02 NOTE — Plan of Care (Signed)
  Problem: Education: Goal: Knowledge of Childbirth will improve 05/02/2022 0432 by Joretta Bachelor, RN Outcome: Completed/Met 05/01/2022 1945 by Joretta Bachelor, RN Outcome: Progressing Goal: Ability to make informed decisions regarding treatment and plan of care will improve 05/02/2022 0432 by Joretta Bachelor, RN Outcome: Completed/Met 05/01/2022 1945 by Joretta Bachelor, RN Outcome: Progressing Goal: Ability to state and carry out methods to decrease the pain will improve 05/02/2022 0432 by Joretta Bachelor, RN Outcome: Completed/Met 05/01/2022 1945 by Joretta Bachelor, RN Outcome: Progressing Goal: Individualized Educational Video(s) 05/02/2022 0432 by Joretta Bachelor, RN Outcome: Completed/Met 05/01/2022 1945 by Joretta Bachelor, RN Outcome: Progressing   Problem: Coping: Goal: Ability to verbalize concerns and feelings about labor and delivery will improve 05/02/2022 0432 by Joretta Bachelor, RN Outcome: Completed/Met 05/01/2022 1945 by Joretta Bachelor, RN Outcome: Progressing   Problem: Life Cycle: Goal: Ability to make normal progression through stages of labor will improve 05/02/2022 0432 by Joretta Bachelor, RN Outcome: Completed/Met 05/01/2022 1945 by Joretta Bachelor, RN Outcome: Progressing Goal: Ability to effectively push during vaginal delivery will improve 05/02/2022 0432 by Joretta Bachelor, RN Outcome: Completed/Met 05/01/2022 1945 by Joretta Bachelor, RN Outcome: Progressing   Problem: Role Relationship: Goal: Will demonstrate positive interactions with the child 05/02/2022 0432 by Joretta Bachelor, RN Outcome: Completed/Met 05/01/2022 1945 by Joretta Bachelor, RN Outcome: Progressing   Problem: Safety: Goal: Risk of complications during labor and delivery will decrease 05/02/2022 0432 by Joretta Bachelor, RN Outcome: Completed/Met 05/01/2022 1945 by Joretta Bachelor, RN Outcome: Progressing   Problem: Pain Management: Goal: Relief or control of pain  from uterine contractions will improve 05/02/2022 0432 by Joretta Bachelor, RN Outcome: Completed/Met 05/01/2022 1945 by Joretta Bachelor, RN Outcome: Progressing

## 2022-05-02 NOTE — Lactation Note (Signed)
This note was copied from a baby's chart. Lactation Consultation Note  Patient Name: Sandra Harding Today's Date: 05/02/2022 Age:29 hours Reason for consult: Initial assessment;Breastfeeding assistance;Early term 37-38.6wks (See Birth Parent- MR hx of GHTN)  Per Birth Parent, infant has been sleeping all day and went 7+ hours without eating . LC discussed with  Birth Parent ETI  behaviors and not go past 31/2 hours without feeding infant. If need help to wake infant  for a feeding to call RN/LC for assistance. LC discussed BF infant 8+ times within 24 hours, STS. LC ask Birth Parent to remove clothing and un-swaddle infant prior to latch, Birth Parent expressed colostrum in spoon and afterwards infant was cuing to breastfeed. Birth Parent latched infant on her left breast using the football hold, infant actively BF for 15 minutes. Due Birth Parent past hx of oversupply which LC briefly discussed with Birth Parent.  Birth Parent was given a hand pump with 24 mm breast flange, she pumped 10 mls and infant was given 9 mls after latching at the breast using a purple and clear bottle nipple. LC discussed infant's input and output and infant had a void and stool while LC was in the room, infant had total of 4 stools and 3 voids since birth. LC discussed importance of maternal rest, diet and hydration. Infant was given the following handouts " Storage and Preparation of Breast milk" and " Breastfeeding Supplementation Sheet". Birth Parent was  made aware of O/P services, breastfeeding support groups, community resources, and our phone # for post-discharge questions.    Birth Parent informed LC she has he own Personal LC at [facility].   Birth Parent understand that infant's feeding plan may change based on infant's feeding behaviors.  Current feeding plan: 1- BF infant by cues, on demand 8+ times within 24 hours, skin to skin. 2-After latching infant at the breast, Birth Parent will  supplement infant with her own EBM  using a hand pump her choice Day 1 offer ( 5-7 mls) or more if infant wants it. 3- Birth Parent knows to call RN/LC if their are any BF questions, concerns or if further latch assistance is needed.    Maternal Data Has patient been taught Hand Expression?: Yes Does the patient have breastfeeding experience prior to this delivery?: Yes How long did the patient breastfeed?: Per Birth Parent, she had past hx of oversupply, BF 1st child for 6 weeks and 2nd child for 3 days. Seen by [facility] multiple times due to oversupply.   Feeding Mother's Current Feeding Choice: Breast Milk  LATCH Score Latch: Grasps breast easily, tongue down, lips flanged, rhythmical sucking.  Audible Swallowing: A few with stimulation  Type of Nipple: Everted at rest and after stimulation  Comfort (Breast/Nipple): Soft / non-tender  Hold (Positioning): Assistance needed to correctly position infant at breast and maintain latch.  LATCH Score: 8   Lactation Tools Discussed/Used Tools: Pump Breast pump type: Manual Pump Education: Setup, frequency, and cleaning;Milk Storage Reason for Pumping: Birth Parent will supplement infant with her own EBM after latching infant at the breast. Pumping frequency: Pump after latching infant at breast with hand pump Pumped volume: 10 mL  Interventions Interventions: Breast feeding basics reviewed;Adjust position;Support pillows;Assisted with latch;Skin to skin;Position options;Education;Breast compression;Hand express;Hand pump;LC Services brochure  Discharge Pump: DEBP  Consult Status Consult Status: Follow-up Date: 05/03/22 Follow-up type: In-patient    Frederico Hamman 05/02/2022, 4:12 PM

## 2022-05-03 ENCOUNTER — Encounter (HOSPITAL_COMMUNITY): Payer: Self-pay | Admitting: Obstetrics and Gynecology

## 2022-05-03 ENCOUNTER — Other Ambulatory Visit (HOSPITAL_COMMUNITY): Payer: Self-pay

## 2022-05-03 LAB — CULTURE, BETA STREP (GROUP B ONLY): Strep Gp B Culture: NEGATIVE

## 2022-05-03 MED ORDER — IBUPROFEN 600 MG PO TABS
600.0000 mg | ORAL_TABLET | Freq: Four times a day (QID) | ORAL | 0 refills | Status: DC
  Filled 2022-05-03: qty 30, 8d supply, fill #0

## 2022-05-03 MED ORDER — FUROSEMIDE 20 MG PO TABS
20.0000 mg | ORAL_TABLET | Freq: Every day | ORAL | 0 refills | Status: DC
  Filled 2022-05-03: qty 5, 5d supply, fill #0

## 2022-05-03 NOTE — Progress Notes (Signed)
Attending Circumcision Counseling Progress Note  Patient desires circumcision for her female infant.  Circumcision procedure details discussed, risks and benefits of procedure were also discussed.  These include but are not limited to: Benefits of circumcision in men include reduction in the rates of urinary tract infection (UTI), penile cancer, some sexually transmitted infections, penile inflammatory and retractile disorders, as well as easier hygiene.  Risks include bleeding , infection, injury of glans which may lead to penile deformity or urinary tract issues, unsatisfactory cosmetic appearance and other potential complications related to the procedure.  It was emphasized that this is an elective procedure.  Patient wants to proceed with circumcision; written informed consent obtained.  Will do circumcision soon, routine circumcision and post circumcision care ordered for the infant.  Jaynie Collins, MD 05/03/2022 11:15 AM

## 2022-05-03 NOTE — Lactation Note (Signed)
This note was copied from a baby's chart. Lactation Consultation Note  Patient Name: Sandra Harding [id number] Date: 05/03/2022 Age:29 hours Reason for consult: Follow-up assessment;Early term 37-38.6wks;Breastfeeding assistance;Infant weight loss (4.83% WL)  LC entered the room and the infant was in the car seat preparing to leave.  The birth parent said that the infant is doing well at the breast.  LC educated the birth parent on engorgement, breast care, warning signs, infant I/O, and outpatient services.  The birth parent stated that she will be going to see the lactation consultant at the pediatrician's office on Monday.  The birth parent had no questions or concerns.   Infant Feeding Plan:  Breastfeed 8+ times in 24 hours according to feeding cues.  Watch infant output and call the pediatrician with questions or concerns.  Call the outpatient lactation consultant for assistance with breastfeeding.    Maternal Data    Feeding Mother's Current Feeding Choice: Breast Milk  LATCH Score                    Lactation Tools Discussed/Used    Interventions Interventions: Education  Discharge Discharge Education: Engorgement and breast care;Warning signs for feeding baby;Outpatient recommendation  Consult Status Consult Status: Complete Date: 05/03/22 Follow-up type: Call as needed    Delene Loll 05/03/2022, 3:27 PM

## 2022-05-06 ENCOUNTER — Inpatient Hospital Stay (HOSPITAL_COMMUNITY): Payer: Medicaid Other

## 2022-05-06 ENCOUNTER — Other Ambulatory Visit: Payer: Self-pay

## 2022-05-06 ENCOUNTER — Inpatient Hospital Stay (HOSPITAL_COMMUNITY)
Admission: AD | Admit: 2022-05-06 | Discharge: 2022-05-07 | Disposition: A | Discharge status: Home / Self Care | Payer: Medicaid Other | Attending: Obstetrics & Gynecology | Admitting: Obstetrics & Gynecology

## 2022-05-06 ENCOUNTER — Encounter: Payer: Medicaid Other | Admitting: Obstetrics & Gynecology

## 2022-05-06 ENCOUNTER — Encounter (HOSPITAL_COMMUNITY): Payer: Self-pay

## 2022-05-06 DIAGNOSIS — Z6790 Unspecified blood type, Rh positive: Secondary | ICD-10-CM

## 2022-05-06 DIAGNOSIS — D509 Iron deficiency anemia, unspecified: Secondary | ICD-10-CM | POA: Diagnosis not present

## 2022-05-06 DIAGNOSIS — N939 Abnormal uterine and vaginal bleeding, unspecified: Secondary | ICD-10-CM | POA: Diagnosis present

## 2022-05-06 LAB — CBC WITH DIFFERENTIAL/PLATELET
Abs Immature Granulocytes: 0.14 10*3/uL — ABNORMAL HIGH (ref 0.00–0.07)
Basophils Absolute: 0 10*3/uL (ref 0.0–0.1)
Basophils Relative: 0 %
Eosinophils Absolute: 0.2 10*3/uL (ref 0.0–0.5)
Eosinophils Relative: 1 %
HCT: 27.9 % — ABNORMAL LOW (ref 36.0–46.0)
Hemoglobin: 9.4 g/dL — ABNORMAL LOW (ref 12.0–15.0)
Immature Granulocytes: 1 %
Lymphocytes Relative: 18 %
Lymphs Abs: 2.1 10*3/uL (ref 0.7–4.0)
MCH: 30.9 pg (ref 26.0–34.0)
MCHC: 33.7 g/dL (ref 30.0–36.0)
MCV: 91.8 fL (ref 80.0–100.0)
Monocytes Absolute: 1.2 10*3/uL — ABNORMAL HIGH (ref 0.1–1.0)
Monocytes Relative: 10 %
Neutro Abs: 8.3 10*3/uL — ABNORMAL HIGH (ref 1.7–7.7)
Neutrophils Relative %: 70 %
Platelets: 257 10*3/uL (ref 150–400)
RBC: 3.04 MIL/uL — ABNORMAL LOW (ref 3.87–5.11)
RDW: 12.2 % (ref 11.5–15.5)
WBC: 12 10*3/uL — ABNORMAL HIGH (ref 4.0–10.5)
nRBC: 0 % (ref 0.0–0.2)

## 2022-05-06 NOTE — ED Triage Notes (Signed)
Pt is 4 days post partum and had been having dizziness, headaches, cramping over whole abdomen, vaginal bleeding. Bleeding is on and off but there are more clots than usual. Pt is changing pads twice within thirty minutes.

## 2022-05-06 NOTE — MAU Note (Signed)
Pt says she del on Thurs 4-11- vag She called on call nurse bc pt passed large blood clot- size of sand dollar  Pad on in Triage- reddish - brown - small amt  Feels cramping -under rib and  middle  left abd

## 2022-05-06 NOTE — ED Provider Triage Note (Signed)
Emergency Medicine Provider Triage Evaluation Note  Sandra Harding , a 27 y.o. female  was evaluated in triage.  Pt complains of dizziness and lightheaded after worsening of vaginal bleeding this afternoon. Pt G3P3 4 days postpartum and states that overall she was doing well when she delivered her baby vaginally 4 days ago with only minimal abdominal cramping.  Abdominal cramping is continued and she was initially having mild bleeding but a few hours ago she began to have heavy bleeding with passage of blood clots.  States that over the last 2 to 3 hours she has been saturating 2-3 pads per hour and is now feeling lightheaded and dizzy.  She reports that during her pregnancy she had chronic hypertension but no other complications.  No history of significant anemia requiring blood transfusion.  Review of Systems  Positive: See HPI Negative: See HPI  Physical Exam  BP 135/86 (BP Location: Right Arm)   Pulse 93   Temp 98 F (36.7 C)   Resp 17   Ht 5\' 2"  (1.575 m)   Wt 92.4 kg   SpO2 98%   BMI 37.26 kg/m  Gen:   Awake, no distress   Resp:  Normal effort lungs clear to auscultation MSK:   Moves extremities without difficulty  Other:  Abdomen soft and nontender, patient neurologically intact  Medical Decision Making  Medically screening exam initiated at 9:37 PM.  Appropriate orders placed.  Anthony C Tremont was informed that the remainder of the evaluation will be completed by another provider, this initial triage assessment does not replace that evaluation, and the importance of remaining in the ED until their evaluation is complete.  MAU APP contacted and they will accept patient over at MAU for evaluation.   Richardson Dopp 05/06/22 2138

## 2022-05-07 DIAGNOSIS — D509 Iron deficiency anemia, unspecified: Secondary | ICD-10-CM

## 2022-05-07 MED ORDER — DOCUSATE SODIUM 100 MG PO CAPS: 100.0000 mg | ORAL_CAPSULE | Freq: Two times a day (BID) | ORAL | 2 refills | Status: DC | PRN

## 2022-05-07 MED ORDER — POLYSACCHARIDE IRON COMPLEX 150 MG PO CAPS: 150.0000 mg | ORAL_CAPSULE | ORAL | 0 refills | Status: DC

## 2022-05-07 NOTE — MAU Provider Note (Signed)
History     CSN: [id number]  Arrival date and time: 05/06/22 2103   Event Date/Time   First Provider Initiated Contact with Patient 05/07/22 0007      Chief Complaint  Patient presents with   Vaginal Bleeding   HPI Sandra Harding is a 27 y.o. [id number] patient. She is s/p SVD/VBAC with 1st degree perineal and periurethral lacerations on 05/02/2022. She presents to MAU from [facility] with chief complaint of vaginal bleeding. This is a new problem, onset this evening. Patient states she passed a clot "the size of a sand dollar" shortly before her arrival in MAU. She denies ongoing vaginal bleeding. She is not experiencing weakness, dizziness or syncope.  Patient also reports lower abdominal cramping. She is also feeling cramping under her rib cage. She denies chest pain, palpitations, and activity intolerance.   Patient receives care with [facility].  OB History     Gravida  3   Para  2   Term  2   Preterm      AB      Living  2      SAB      IAB      Ectopic      Multiple  0   Live Births  2           Past Medical History:  Diagnosis Date   Headache    PFO (patent foramen ovale)    Pregnancy induced hypertension    Vitamin B 12 deficiency     Past Surgical History:  Procedure Laterality Date   CESAREAN SECTION N/A 09/27/2013   Procedure: CESAREAN SECTION;  Surgeon: Lesly Dukes, MD;  Location: [facility];  Service: Obstetrics;  Laterality: N/A;    Family History  Problem Relation Age of Onset   Cancer Maternal Grandfather    Diabetes Paternal Aunt    Heart attack Maternal Grandmother    Alzheimer's disease Paternal Grandfather    Cancer Maternal Uncle     Social History   Tobacco Use   Smoking status: Former   Smokeless tobacco: Never  Building services engineer Use: Former  Substance Use Topics   Alcohol use: No   Drug use: Not Currently    Types: Marijuana    Comment: last use seep 2023    Allergies:  Allergies  Allergen Reactions   Sulfa  Antibiotics Hives and Rash    Medications Prior to Admission  Medication Sig Dispense Refill Last Dose   cyanocobalamin (VITAMIN B12) 1000 MCG tablet Take 1,000 mcg by mouth daily.   05/06/2022   furosemide (LASIX) 20 MG tablet Take 1 tablet (20 mg total) by mouth daily for 5 days. 5 tablet 0 05/06/2022   ibuprofen (ADVIL) 600 MG tablet Take 1 tablet (600 mg total) by mouth every 6 (six) hours. 30 tablet 0 05/06/2022   Prenatal 27-1 MG TABS Take 1 tablet by mouth daily.   05/06/2022   mupirocin ointment (BACTROBAN) 2 % Apply 1 Application topically 2 (two) times daily. 22 g 0     Review of Systems  Gastrointestinal:  Positive for abdominal pain.  Genitourinary:  Positive for vaginal bleeding.  All other systems reviewed and are negative.  Physical Exam   Blood pressure 126/73, pulse (!) 103, temperature 99.2 F (37.3 C), temperature source Oral, resp. rate 18, height  (1.575 m), weight 87.2 kg, SpO2 98 %, unknown if currently breastfeeding.  Physical Exam Vitals and nursing note reviewed. Exam conducted with a chaperone  present.  Constitutional:      Appearance: Normal appearance.  Cardiovascular:     Rate and Rhythm: Normal rate and regular rhythm.     Pulses: Normal pulses.     Heart sounds: Normal heart sounds.  Pulmonary:     Effort: Pulmonary effort is normal.     Breath sounds: Normal breath sounds.  Abdominal:     General: Abdomen is flat.  Genitourinary:    Comments: Pelvic exam: External genitalia normal, vaginal walls pink and well rugated, cervix visually closed, no lesions noted. Small amount of dark red blood.   Skin:    Capillary Refill: Capillary refill takes less than 2 seconds.  Neurological:     Mental Status: She is alert and oriented to person, place, and time.  Psychiatric:        Mood and Affect: Mood normal.        Behavior: Behavior normal.        Thought Content: Thought content normal.        Judgment: Judgment normal.     MAU Course   Procedures  MDM Orders Placed This Encounter  Procedures   US PELVIS (TRANSABDOMINAL ONLY)   CBC with Differential/Platelet   Discharge patient   Patient Vitals for the past 24 hrs:  BP Temp Temp src Pulse Resp SpO2 Height Weight  05/07/22 0015 131/71 -- -- 88 -- -- -- --  05/06/22 2213 126/73 99.2 F (37.3 C) Oral (!) 103 18 -- 5\' 2"  (1.575 m) 87.2 kg  05/06/22 2124 -- -- -- -- -- -- 5\' 2"  (1.575 m) 92.4 kg  05/06/22 2114 -- 98 F (36.7 C) -- -- -- -- -- --  05/06/22 2113 135/86 -- -- 93 17 98 % -- --   Results for orders placed or performed during the hospital encounter of 05/06/22 (from the past 24 hour(s))  CBC with Differential/Platelet     Status: Abnormal   Collection Time: 05/06/22 11:13 PM  Result Value Ref Range   WBC 12.0 (H) 4.0 - 10.5 K/uL   RBC 3.04 (L) 3.87 - 5.11 MIL/uL   Hemoglobin 9.4 (L) 12.0 - 15.0 g/dL   HCT 29.5 (L) 62.1 - 30.8 %   MCV 91.8 80.0 - 100.0 fL   MCH 30.9 26.0 - 34.0 pg   MCHC 33.7 30.0 - 36.0 g/dL   RDW 65.7 84.6 - 96.2 %   Platelets 257 150 - 400 K/uL   nRBC 0.0 0.0 - 0.2 %   Neutrophils Relative % 70 %   Neutro Abs 8.3 (H) 1.7 - 7.7 K/uL   Lymphocytes Relative 18 %   Lymphs Abs 2.1 0.7 - 4.0 K/uL   Monocytes Relative 10 %   Monocytes Absolute 1.2 (H) 0.1 - 1.0 K/uL   Eosinophils Relative 1 %   Eosinophils Absolute 0.2 0.0 - 0.5 K/uL   Basophils Relative 0 %   Basophils Absolute 0.0 0.0 - 0.1 K/uL   Immature Granulocytes 1 %   Abs Immature Granulocytes 0.14 (H) 0.00 - 0.07 K/uL   US PELVIS (TRANSABDOMINAL ONLY)  Result Date: 05/06/2022 CLINICAL DATA:  Past large clot today. Heavy vaginal bleeding. Recent vaginal delivery on 04/11 EXAM: TRANSABDOMINAL ULTRASOUND OF PELVIS TECHNIQUE: Transabdominal ultrasound examination of the pelvis was performed including evaluation of the uterus, ovaries, adnexal regions, and pelvic cul-de-sac. COMPARISON:  Ultrasound 04/27/2022 FINDINGS: Uterus Measurements: 15.4 x 8.2 x 8.9 cm = volume: 591 mL.  No fibroids or other mass visualized. Endometrium Thickness: 43 mm. Homogeneous  thickened endometrium with minimal internal vascularity. Right ovary Measurements: 2.5 x 1.9 x 1.8 cm = volume: 4.3 mL. Normal appearance/no adnexal mass. Left ovary Measurements: 2.8 x 1.3 x 2.4 cm = volume: 4.7 mL. Normal appearance/no adnexal mass. Other findings:  No abnormal free fluid. IMPRESSION: Homogeneous thickened endometrium compatible with recent postpartum state. No evidence of retained products of conception. Electronically Signed   By: Minerva Fester M.D.   On: 05/06/2022 23:57    Meds ordered this encounter  Medications   iron polysaccharides (NIFEREX) 150 MG capsule    Sig: Take 1 capsule (150 mg total) by mouth every other day.    Dispense:  15 capsule    Refill:  0    Order Specific Question:   Supervising Provider    Answer:   Despina Hidden, LUTHER H [[id number]]   docusate sodium (COLACE) 100 MG capsule    Sig: Take 1 capsule (100 mg total) by mouth 2 (two) times daily as needed.    Dispense:  30 capsule    Refill:  2    Order Specific Question:   Supervising Provider    Answer:   Lazaro Arms [[id number]]   Assessment and Plan  --27 y.o. [id number] s/p VBAC 05/02/2022 --No acute findings on labs or imaging, recovering appropriately --Hgb 9.4, asymptomatic in MAU --Initiate PO iron every other day --Discharge home in stable condition  Calvert Cantor, MSA, MSN, CNM 05/07/2022, 2:11 AM

## 2022-05-09 ENCOUNTER — Encounter: Payer: Self-pay | Admitting: Obstetrics & Gynecology

## 2022-05-09 ENCOUNTER — Ambulatory Visit (INDEPENDENT_AMBULATORY_CARE_PROVIDER_SITE_OTHER): Payer: Medicaid Other | Admitting: Obstetrics & Gynecology

## 2022-05-09 DIAGNOSIS — Z8759 Personal history of other complications of pregnancy, childbirth and the puerperium: Secondary | ICD-10-CM

## 2022-05-09 NOTE — Progress Notes (Signed)
POSTPARTUM PROBLEM OFFICE VISIT NOTE  History:   Sandra Harding is a 27 y.o. [id number] PPD#7 s/p VBAC here today for BP check, given history of GHTN. Had more than expected bleeding, was seen in MAU on 05/07/22 and had negative evaluation including Hgb of 9.4, and ultrasound without evidence of retained POCs.  Was seen again at [facility] ED on 05/08/22, had negative evaluation, Hgb 10.4.  Today, not much bleeding but she is concerned. She denies any dizziness, lightheadedness, excessive bleeding/clots now, pelvic pain or other concerns.    Past Medical History:  Diagnosis Date   Headache    PFO (patent foramen ovale)    Pregnancy induced hypertension    Vitamin B 12 deficiency     Past Surgical History:  Procedure Laterality Date   CESAREAN SECTION N/A 09/27/2013   Procedure: CESAREAN SECTION;  Surgeon: Lesly Dukes, MD;  Location: [facility];  Service: Obstetrics;  Laterality: N/A;    The following portions of the patient's history were reviewed and updated as appropriate: allergies, current medications, past family history, past medical history, past social history, past surgical history and problem list.   Review of Systems:  Pertinent items noted in HPI and remainder of comprehensive ROS otherwise negative.  Physical Exam:  BP 127/69   Pulse 89  CONSTITUTIONAL: Well-developed, well-nourished female in no acute distress.  HEENT:  Normocephalic, atraumatic. External right and left ear normal. No scleral icterus.  NECK: Normal range of motion, supple, no masses noted on observation SKIN: No rash noted. Not diaphoretic. No erythema. No pallor. MUSCULOSKELETAL: Normal range of motion. No edema noted. NEUROLOGIC: Alert and oriented to person, place, and time. Normal muscle tone coordination. No cranial nerve deficit noted. PSYCHIATRIC: Normal mood and affect. Normal behavior. Normal judgment and thought content. CARDIOVASCULAR: Normal heart rate noted RESPIRATORY: Effort and breath  sounds normal, no problems with respiration noted ABDOMEN: No masses noted. No other overt distention noted.   PELVIC: Deferred  Labs and Imaging Results for orders placed or performed during the hospital encounter of 05/06/22 (from the past 168 hour(s))  CBC with Differential/Platelet   Collection Time: 05/06/22 11:13 PM  Result Value Ref Range   WBC 12.0 (H) 4.0 - 10.5 K/uL   RBC 3.04 (L) 3.87 - 5.11 MIL/uL   Hemoglobin 9.4 (L) 12.0 - 15.0 g/dL   HCT 45.4 (L) 09.8 - 11.9 %   MCV 91.8 80.0 - 100.0 fL   MCH 30.9 26.0 - 34.0 pg   MCHC 33.7 30.0 - 36.0 g/dL   RDW 14.7 82.9 - 56.2 %   Platelets 257 150 - 400 K/uL   nRBC 0.0 0.0 - 0.2 %   Neutrophils Relative % 70 %   Neutro Abs 8.3 (H) 1.7 - 7.7 K/uL   Lymphocytes Relative 18 %   Lymphs Abs 2.1 0.7 - 4.0 K/uL   Monocytes Relative 10 %   Monocytes Absolute 1.2 (H) 0.1 - 1.0 K/uL   Eosinophils Relative 1 %   Eosinophils Absolute 0.2 0.0 - 0.5 K/uL   Basophils Relative 0 %   Basophils Absolute 0.0 0.0 - 0.1 K/uL   Immature Granulocytes 1 %   Abs Immature Granulocytes 0.14 (H) 0.00 - 0.07 K/uL   US PELVIS (TRANSABDOMINAL ONLY)  Result Date: 05/06/2022 CLINICAL DATA:  Past large clot today. Heavy vaginal bleeding. Recent vaginal delivery on 04/11 EXAM: TRANSABDOMINAL ULTRASOUND OF PELVIS TECHNIQUE: Transabdominal ultrasound examination of the pelvis was performed including evaluation of the uterus, ovaries, adnexal regions,  and pelvic cul-de-sac. COMPARISON:  Ultrasound 04/27/2022 FINDINGS: Uterus Measurements: 15.4 x 8.2 x 8.9 cm = volume: 591 mL. No fibroids or other mass visualized. Endometrium Thickness: 43 mm. Homogeneous thickened endometrium with minimal internal vascularity. Right ovary Measurements: 2.5 x 1.9 x 1.8 cm = volume: 4.3 mL. Normal appearance/no adnexal mass. Left ovary Measurements: 2.8 x 1.3 x 2.4 cm = volume: 4.7 mL. Normal appearance/no adnexal mass. Other findings:  No abnormal free fluid. IMPRESSION: Homogeneous  thickened endometrium compatible with recent postpartum state. No evidence of retained products of conception. Electronically Signed   By: Minerva Fester M.D.   On: 05/06/2022 23:57       Assessment and Plan:    1. Abnormal uterine bleeding, postpartum Patient reassured about negative evaluation. All concerns addressed. Bleeding precautions reviewed.  2. History of gestational hypertension Stable BP, not on any medications. Continue BP checks at home as instructed.  Routine preventative health maintenance measures emphasized. Please refer to After Visit Summary for other counseling recommendations.   Return for Postpartum visit as scheduled.    I spent 25 minutes dedicated to the care of this patient including pre-visit review of records, face to face time with the patient discussing her conditions and treatments and post visit orders.    Jaynie Collins, MD, FACOG Obstetrician & Gynecologist, [facility] for [employer], [facility]

## 2022-05-13 ENCOUNTER — Encounter: Payer: Medicaid Other | Admitting: Obstetrics & Gynecology

## 2022-05-20 ENCOUNTER — Encounter: Payer: Medicaid Other | Admitting: Obstetrics and Gynecology

## 2022-06-10 NOTE — Progress Notes (Unsigned)
Post Partum Visit Note  Sandra Harding is a 27 y.o. G68P2002 female who presents for a postpartum visit. She is 5 weeks postpartum following a normal spontaneous vaginal delivery.  I have fully reviewed the prenatal and intrapartum course. The delivery was at 37.1 gestational weeks.  Anesthesia: epidural. Postpartum course has been unremarkable. Baby is doing well. Baby is feeding by bottle - Similac Advance. Bleeding no bleeding. Bowel function is normal. Bladder function is normal. Patient is sexually active. Contraception method is condoms until tubal ligation-needs to schedule. Postpartum depression screening: negative.  Sandra Harding shares that she has noticed some "bruising" around prior cesarean incision within 7 days of her recent delivery. Has not noticed them increasing in size or changing color. Denies any pain or changes since she first noticed them. Patient also states new vaginal odor that presents clear but leaves a light beige tint on her undergarments. Has been wearing pantiliners and changing them 3x/day. States this is similar to when she has had BV in the past.  The pregnancy intention screening data noted above was reviewed. Potential methods of contraception were discussed. The patient elected to proceed with No data recorded.   Edinburgh Postnatal Depression Scale - 06/11/22 1318       Edinburgh Postnatal Depression Scale:  In the Past 7 Days   I have been able to laugh and see the funny side of things. 0    I have looked forward with enjoyment to things. 0    I have been anxious or worried for no good reason. 2    I have felt scared or panicky for no good reason. 0    Things have been getting on top of me. 0    I have been so unhappy that I have had difficulty sleeping. 0    I have felt sad or miserable. 0    I have been so unhappy that I have been crying. 0    The thought of harming myself has occurred to me. 0             Health Maintenance Due  Topic Date Due    COVID-19 Vaccine (1) Never done    The following portions of the patient's history were reviewed and updated as appropriate: allergies, current medications, past family history, past medical history, past social history, past surgical history, and problem list.  Review of Systems Genitourinary:positive for vaginal discharge  Objective:  BP 117/78   Pulse 72   Ht 5\' 2"  (1.575 m)   Wt 189 lb (85.7 kg)   Breastfeeding No   BMI 34.57 kg/m    General:  alert, cooperative, and appears stated age   Breasts:  normal  Lungs: clear to auscultation bilaterally  Heart:  regular rate and rhythm, S1, S2 normal, no murmur, click, rub or gallop  Abdomen: normal findings: soft, non-tender, umbilicus normal, and striae present    Wound N/a  GU exam:  not indicated       Assessment:    There are no diagnoses linked to this encounter.  Normal postpartum exam.   Plan:   Essential components of care per ACOG recommendations:  1.  Mood and well being: Patient with negative depression screening today. Reviewed local resources for support.  - Patient tobacco use? No.   - hx of drug use? No.    2. Infant care and feeding:  -Patient currently breastmilk feeding? No.  -Social determinants of health (SDOH) reviewed in EPIC. No concerns  3. Sexuality, contraception and birth spacing - Patient does not want a pregnancy in the next year.  Desired family size is 3 children.  - Reviewed reproductive life planning. Reviewed contraceptive methods based on pt preferences and effectiveness.  Patient desired Female Sterilization today.  Plan to use condoms or abstain from sexual intercourse until time of surgery. Consent signed at prior visit. - Discussed birth spacing of 18 months  4. Sleep and fatigue -Encouraged family/partner/community support of 4 hrs of uninterrupted sleep to help with mood and fatigue  5. Physical Recovery  - Discussed patients delivery and complications. She describes her  labor as good. - Patient had a Vaginal, no problems at delivery. Patient had a 1st degree laceration. Perineal healing reviewed. Patient expressed understanding - Patient has urinary incontinence? No. - Patient is safe to resume physical and sexual activity - Current discharge consistent with BV. Discussed recommendations for decreasing reoccurrence. Patient elected to self-swab today. Rx placed due to reported symptoms and patient history of BV.  - Discussed normal presentation of striae PP. Patient can consider vitamin E oil if marks are bothersome. Patient is accepting of normal changes post pregnancy.     6.  Health Maintenance - HM due items addressed Yes - discussed PAP in 1 year - Last pap smear  Diagnosis  Date Value Ref Range Status  08/03/2020   Final   - Negative for Intraepithelial Lesions or Malignancy (NILM)  08/03/2020 - Benign reactive/reparative changes  Final   Pap smear not done at today's visit.  -Breast Cancer screening indicated? No.   7. Chronic Disease/Pregnancy Condition follow up: None , BP stabilized postpartum  - PCP follow up  Corlis Hove, NP Center for [employer], [facility]

## 2022-06-11 ENCOUNTER — Ambulatory Visit (INDEPENDENT_AMBULATORY_CARE_PROVIDER_SITE_OTHER): Payer: Medicaid Other | Admitting: Student

## 2022-06-11 ENCOUNTER — Encounter: Payer: Self-pay | Admitting: Student

## 2022-06-11 ENCOUNTER — Other Ambulatory Visit (HOSPITAL_COMMUNITY)
Admission: RE | Admit: 2022-06-11 | Discharge: 2022-06-11 | Disposition: A | Discharge status: Home / Self Care | Payer: Medicaid Other | Source: Ambulatory Visit | Attending: Student | Admitting: Student

## 2022-06-11 DIAGNOSIS — N898 Other specified noninflammatory disorders of vagina: Secondary | ICD-10-CM | POA: Diagnosis present

## 2022-06-11 MED ORDER — METRONIDAZOLE 0.75 % VA GEL
1.0000 | Freq: Every day | VAGINAL | 1 refills | Status: AC
Start: 2022-06-11 — End: 2022-06-18

## 2022-06-12 LAB — CERVICOVAGINAL ANCILLARY ONLY
Bacterial Vaginitis (gardnerella): POSITIVE — AB
Candida Glabrata: NEGATIVE
Candida Vaginitis: NEGATIVE
Comment: NEGATIVE
Comment: NEGATIVE
Comment: NEGATIVE

## 2022-06-19 ENCOUNTER — Telehealth: Payer: Self-pay | Admitting: *Deleted

## 2022-06-19 NOTE — Telephone Encounter (Signed)
-----   Message from Corlis Hove, NP sent at 06/14/2022  6:04 PM EDT ----- Hello- would you be able to help Ms. Sickman get scheduled with a surgeon for her "BTL surgery consult" please? Thank you so much! - Corlis Hove, NP

## 2022-06-19 NOTE — Telephone Encounter (Signed)
Left patient a message to call and schedule. 

## 2022-07-15 ENCOUNTER — Encounter: Payer: Self-pay | Admitting: Obstetrics and Gynecology

## 2022-07-15 ENCOUNTER — Ambulatory Visit: Payer: Medicaid Other | Admitting: Obstetrics and Gynecology

## 2022-07-15 VITALS — BP 135/80 | HR 50 | Resp 16 | Ht 62.0 in | Wt 190.0 lb

## 2022-07-15 DIAGNOSIS — Z3009 Encounter for other general counseling and advice on contraception: Secondary | ICD-10-CM

## 2022-07-15 NOTE — Progress Notes (Signed)
   RETURN GYNECOLOGY VISIT  Subjective:  Sandra Harding is a 27 y.o. G3P3 who is ~2 months postpartum presenting for discussion of permanent sterilization.   She is certain she does not want more children. Has been on OCPs and IUD in the past. Had complications with her last IUD. Does not want to be on birth control. Her partner is considering vasectomy.   History notable for prior CS x1.   Objective:   Vitals:   07/15/22 1026  BP: 135/80  Pulse: (!) 50  Resp: 16  Weight: 190 lb (86.2 kg)  Height: 5\' 2"  (1.575 m)   General:  Alert, oriented and cooperative. Patient is in no acute distress.  Skin: Skin is warm and dry. No rash noted.   Cardiovascular: Normal heart rate noted  Respiratory: Normal respiratory effort, no problems with respiration noted  Abdomen: Soft, non-tender, non-distended    Assessment and Plan:  Sandra Harding is a 27 y.o. presenting for sterilization consult  Sterilization consult - She desires permanent sterilization. Discussed alternatives including LARC options and vasectomy. She declines LARC. Her partner is going to meet with his doctor discussion vasectomy. We discussed lower risks & shorter recovery for vasectomy compared with bilateral salpingectomy.  - Discussed surgery of laparoscopic bilateral salpingectomy vs tubal ligation. Recommend salpingectomy due to theoretical lower risk of contraceptive failure and potential to decrease risk of ovarian cancer. She is in agreement. - Risks of surgery include but are not limited to: bleeding, infection, injury to surrounding organs/tissues (i.e. bowel/bladder/ureters), need for additional procedures, wound complications, hospital re-admission, and conversion to open surgery, VTE. We reviewed risk of contraceptive failure and risk of regret.  - Reviewed restrictions and recovery following surgery - After discussion, pt would like to schedule laparoscopic bilateral salpingectomy for September. Counseled to  call us ASAP to cancel surgery if she changes her mind or her partner opts for vasectomy so that we can utilize OR time appropriately  - She is currently using condoms for contraception. Declines any other form of contraception at this time.  Message routed to surgery scheduling.  Lennart Pall, MD

## 2022-08-19 ENCOUNTER — Telehealth: Payer: Self-pay

## 2022-08-19 NOTE — Telephone Encounter (Signed)
Called patient to see if she was available for surgery on 10/08/22 w/Dr. Berton Lan? Patient states her boyfriend is open to having a vasectomy so she'd like to hold off on scheduling the procedure. States she will notify the provider if she decides to proceed w/ procedure.

## 2022-11-25 ENCOUNTER — Telehealth: Payer: Self-pay | Admitting: *Deleted

## 2022-11-25 ENCOUNTER — Ambulatory Visit: Payer: Medicaid Other | Admitting: *Deleted

## 2022-11-25 DIAGNOSIS — Z3201 Encounter for pregnancy test, result positive: Secondary | ICD-10-CM | POA: Diagnosis not present

## 2022-11-25 DIAGNOSIS — Z3200 Encounter for pregnancy test, result unknown: Secondary | ICD-10-CM

## 2022-11-25 LAB — POCT URINE PREGNANCY: Preg Test, Ur: POSITIVE — AB

## 2022-11-25 NOTE — Progress Notes (Signed)
LMP 10/26/22  UPT today is positive.  Pt to schedule NOB in about 6 weeks

## 2022-11-25 NOTE — Telephone Encounter (Signed)
Left patient a message that the office will call her back on 11/26/2022 to schedule a New OB appt.

## 2023-01-06 ENCOUNTER — Encounter: Payer: Self-pay | Admitting: *Deleted

## 2023-01-06 DIAGNOSIS — Z3480 Encounter for supervision of other normal pregnancy, unspecified trimester: Secondary | ICD-10-CM | POA: Insufficient documentation

## 2023-01-08 ENCOUNTER — Encounter: Payer: Self-pay | Admitting: Obstetrics and Gynecology

## 2023-01-08 ENCOUNTER — Other Ambulatory Visit (INDEPENDENT_AMBULATORY_CARE_PROVIDER_SITE_OTHER): Payer: Medicaid Other

## 2023-01-08 ENCOUNTER — Ambulatory Visit (INDEPENDENT_AMBULATORY_CARE_PROVIDER_SITE_OTHER): Payer: Medicaid Other | Admitting: Obstetrics and Gynecology

## 2023-01-08 VITALS — BP 124/78 | HR 75 | Wt 181.0 lb

## 2023-01-08 DIAGNOSIS — Z3481 Encounter for supervision of other normal pregnancy, first trimester: Secondary | ICD-10-CM

## 2023-01-08 DIAGNOSIS — Z98891 History of uterine scar from previous surgery: Secondary | ICD-10-CM

## 2023-01-08 DIAGNOSIS — Q2112 Patent foramen ovale: Secondary | ICD-10-CM | POA: Diagnosis not present

## 2023-01-08 DIAGNOSIS — Z302 Encounter for sterilization: Secondary | ICD-10-CM

## 2023-01-08 DIAGNOSIS — Z1339 Encounter for screening examination for other mental health and behavioral disorders: Secondary | ICD-10-CM

## 2023-01-08 DIAGNOSIS — Z3480 Encounter for supervision of other normal pregnancy, unspecified trimester: Secondary | ICD-10-CM

## 2023-01-08 DIAGNOSIS — Z8759 Personal history of other complications of pregnancy, childbirth and the puerperium: Secondary | ICD-10-CM

## 2023-01-08 DIAGNOSIS — Z3A10 10 weeks gestation of pregnancy: Secondary | ICD-10-CM

## 2023-01-08 DIAGNOSIS — O34211 Maternal care for low transverse scar from previous cesarean delivery: Secondary | ICD-10-CM

## 2023-01-08 DIAGNOSIS — G43419 Hemiplegic migraine, intractable, without status migrainosus: Secondary | ICD-10-CM | POA: Diagnosis not present

## 2023-01-08 DIAGNOSIS — Z141 Cystic fibrosis carrier: Secondary | ICD-10-CM

## 2023-01-08 DIAGNOSIS — N6489 Other specified disorders of breast: Secondary | ICD-10-CM | POA: Diagnosis not present

## 2023-01-08 DIAGNOSIS — Z3401 Encounter for supervision of normal first pregnancy, first trimester: Secondary | ICD-10-CM

## 2023-01-08 MED ORDER — ASPIRIN 81 MG PO TBEC: 162.0000 mg | DELAYED_RELEASE_TABLET | Freq: Every evening | ORAL | 3 refills | Status: DC

## 2023-01-08 NOTE — Patient Instructions (Signed)
Congratulations! See you again at 20 weeks.   Please start taking 2 baby aspirin at night at 12 weeks

## 2023-01-08 NOTE — Progress Notes (Signed)
Subjective:   Sandra Harding is a 27 y.o. [phone] at [redacted]w[redacted]d by LMP c/w today's [place] being seen today for her first obstetrical visit.  Her obstetrical history is significant for  prior CS followed by VBAC x 2, gHTN, CF carrier, and PFO . Patient does intend to breast feed. Pregnancy history fully reviewed.  Patient reports  has bump in her right axilla and notes that her right breast is newly significantly larger than her left breast .  HISTORY: OB History  Gravida Para Term Preterm AB Living  4 3 3  0 0 3  SAB IAB Ectopic Multiple Live Births  0 0 0 0 3    # Outcome Date GA Lbr Len/2nd Weight Sex Type Anes PTL Lv  4 Current           3 Term 05/02/22 [redacted]w[redacted]d  6 lb 10 oz (3.005 kg)  VBAC   LIV  2 Term 01/19/18 [redacted]w[redacted]d / 01:28 7 lb 8.8 oz (3.425 kg) M VBAC EPI  LIV     Name: Gropp,BOY Tru     Apgar1: 9  Apgar5: 9  1 Term 09/27/13 [redacted]w[redacted]d  5 lb 10.7 oz (2.571 kg) F CS-LTranv Gen  LIV     Complications: Fetal Intolerance     Name: Kimmey,GIRL Tesha     Apgar1: 8  Apgar5: 9     Last pap smear: Lab Results  Component Value Date   DIAGPAP  08/03/2020    - Negative for Intraepithelial Lesions or Malignancy (NILM)   DIAGPAP - Benign reactive/reparative changes 08/03/2020    Past Medical History:  Diagnosis Date   Headache    PFO (patent foramen ovale)    Pregnancy induced hypertension    Vitamin B 12 deficiency    Past Surgical History:  Procedure Laterality Date   CESAREAN SECTION N/A 09/27/2013   Procedure: CESAREAN SECTION;  Surgeon: Lesly Dukes, MD;  Location: [facility];  Service: Obstetrics;  Laterality: N/A;   Family History  Problem Relation Age of Onset   Cancer Maternal Grandfather    Diabetes Paternal Aunt    Heart attack Maternal Grandmother    Alzheimer's disease Paternal Grandfather    Cancer Maternal Uncle    Social History   Tobacco Use   Smoking status: Former   Smokeless tobacco: Never  Advertising account planner   Vaping status: Former  Substance Use Topics    Alcohol use: No   Drug use: Not Currently    Types: Marijuana    Comment: last use seep 2023   Allergies  Allergen Reactions   Sulfa Antibiotics Hives and Rash   Current Outpatient Medications on File Prior to Visit  Medication Sig Dispense Refill   Prenatal Vit-Fe Fumarate-FA (PRENATAL VITAMINS PO) Take by mouth.     No current facility-administered medications on file prior to visit.    Exam   Vitals:   01/08/23 1005  BP: 124/78  Pulse: 75  Weight: 181 lb (82.1 kg)   Fetal Heart Rate (bpm): 168  General:  Alert, oriented and cooperative. Patient is in no acute distress.  Breast: R > L with some axillary fullness. No skin changes or masses noted.  Cardiovascular: Normal heart rate noted  Respiratory: Normal respiratory effort, no problems with respiration noted  Abdomen: Soft, gravid, appropriate for gestational age.  Pain/Pressure: Present        Extremities: Normal range of motion.  Edema: None  Mental Status: Normal mood and affect. Normal behavior. Normal judgment  and thought content.    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 Supervision of other normal pregnancy, antepartum (Primary) Discussed babyscripts optimized schedule, pt agreeable Initial labs drawn. Continue prenatal vitamins. Genetic Screening discussed: NIPS, carrier screening and AFP, ordered. Ultrasound discussed; fetal anatomic survey: ordered. Problem list reviewed and updated. The nature of [facility] with multiple MDs and other Advanced  Practice Providers was explained to patient; also emphasized that residents, students are part of our team. Routine obstetric precautions reviewed. - Pregnancy, Initial Screen - Hemoglobin A1c - US OB Limited; Future - Babyscripts Schedule Optimization - [place] MFM OB DETAIL +14 WK; Future - PANORAMA PRENATAL TEST  3. History of cesarean section 9. Sterilization request G1 NRFHT 2571g > VBAC x 2 of 3425g and 3005g infant For [facility] Partner plans vasectomy. If she gets a CS she would like salpingectomy, too Will sign tubal papers at 28w  4. History of gestational hypertension Normotensive today Discussed ldASA (162mg ) at bedtime for preeclampsia prevention. She is agreeable, rx sent Pt left before CMP, PCR ordered, will obtain next visit  5. Breast asymmetry - [place] LIMITED ULTRASOUND INCLUDING AXILLA RIGHT BREAST; Future  6. Intractable hemiplegic migraine without status migrainosus 7. PFO (patent foramen ovale) Discussed indications to present to ED  8. Cystic fibrosis carrier Will give partner test kit, has not been tested in prior pregnancies  Return in about 8 weeks (around 03/05/2023) for return OB at 20 weeks.  Harvie Bridge, MD Obstetrician & Gynecologist, [facility] for [employer], [facility]

## 2023-01-11 LAB — PREGNANCY, INITIAL SCREEN
Antibody Screen: NEGATIVE
Basophils Absolute: 0 10*3/uL (ref 0.0–0.2)
Basos: 0 %
Bilirubin, UA: NEGATIVE
Chlamydia trachomatis, NAA: NEGATIVE
EOS (ABSOLUTE): 0.1 10*3/uL (ref 0.0–0.4)
Eos: 2 %
Glucose, UA: NEGATIVE
HCV Ab: NONREACTIVE
HIV Screen 4th Generation wRfx: NONREACTIVE
Hematocrit: 33.5 % — ABNORMAL LOW (ref 34.0–46.6)
Hemoglobin: 11.5 g/dL (ref 11.1–15.9)
Hepatitis B Surface Ag: NEGATIVE
Immature Grans (Abs): 0 10*3/uL (ref 0.0–0.1)
Immature Granulocytes: 0 %
Ketones, UA: NEGATIVE
Leukocytes,UA: NEGATIVE
Lymphocytes Absolute: 1.8 10*3/uL (ref 0.7–3.1)
Lymphs: 23 %
MCH: 30.3 pg (ref 26.6–33.0)
MCHC: 34.3 g/dL (ref 31.5–35.7)
MCV: 88 fL (ref 79–97)
Monocytes Absolute: 0.5 10*3/uL (ref 0.1–0.9)
Monocytes: 6 %
Neisseria Gonorrhoeae by PCR: NEGATIVE
Neutrophils Absolute: 5.4 10*3/uL (ref 1.4–7.0)
Neutrophils: 69 %
Nitrite, UA: NEGATIVE
Platelets: 298 10*3/uL (ref 150–450)
RBC, UA: NEGATIVE
RBC: 3.8 x10E6/uL (ref 3.77–5.28)
RDW: 11.8 % (ref 11.7–15.4)
RPR Ser Ql: NONREACTIVE
Rh Factor: POSITIVE
Rubella Antibodies, IGG: 4.99 {index} (ref >0.99)
Specific Gravity, UA: >=1.03 — AB (ref 1.005–1.030)
Urobilinogen, Ur: 0.2 mg/dL (ref 0.2–1.0)
WBC: 7.8 10*3/uL (ref 3.4–10.8)
pH, UA: 5.5 (ref 5.0–7.5)

## 2023-01-11 LAB — HEMOGLOBIN A1C
Est. average glucose Bld gHb Est-mCnc: 111 mg/dL
Hgb A1c MFr Bld: 5.5 % (ref 4.8–5.6)

## 2023-01-11 LAB — MICROSCOPIC EXAMINATION
Bacteria, UA: NONE SEEN
Casts: NONE SEEN /[LPF]
RBC, Urine: NONE SEEN /[HPF] (ref 0–2)
WBC, UA: NONE SEEN /[HPF] (ref 0–5)

## 2023-01-11 LAB — URINE CULTURE, OB REFLEX

## 2023-01-11 LAB — HCV INTERPRETATION

## 2023-01-17 LAB — PANORAMA PRENATAL TEST FULL PANEL:PANORAMA TEST PLUS 5 ADDITIONAL MICRODELETIONS: FETAL FRACTION: 10.2

## 2023-01-22 NOTE — L&D Delivery Note (Signed)
 Delivery Note At 2:13 AM a viable and healthy female was delivered via VBAC, Spontaneous (Presentation: Left Occiput Anterior).  APGAR: 9, 9; weight 7 lb 11.5 oz (3500 g).   Placenta status: Spontaneous, Intact.  Cord: 3 vessels with the following complications: None.    Anesthesia: Epidural Episiotomy: None Lacerations: 2nd degree;Perineal Suture Repair: 3-0 vicryl rapide Est. Blood Loss (mL): 197  Mom to postpartum.  Baby to Couplet care / Skin to Skin.  Earnie Pouch 07/30/2023, 5:30 AM

## 2023-03-11 ENCOUNTER — Encounter: Payer: Self-pay | Admitting: *Deleted

## 2023-03-11 ENCOUNTER — Ambulatory Visit: Payer: Medicaid Other | Admitting: *Deleted

## 2023-03-11 ENCOUNTER — Ambulatory Visit (HOSPITAL_BASED_OUTPATIENT_CLINIC_OR_DEPARTMENT_OTHER): Payer: Medicaid Other | Admitting: Maternal & Fetal Medicine

## 2023-03-11 ENCOUNTER — Other Ambulatory Visit: Payer: Self-pay | Admitting: *Deleted

## 2023-03-11 ENCOUNTER — Ambulatory Visit: Payer: Medicaid Other | Attending: Obstetrics and Gynecology

## 2023-03-11 VITALS — BP 127/63 | HR 74

## 2023-03-11 DIAGNOSIS — Z3480 Encounter for supervision of other normal pregnancy, unspecified trimester: Secondary | ICD-10-CM | POA: Diagnosis not present

## 2023-03-11 DIAGNOSIS — O09299 Supervision of pregnancy with other poor reproductive or obstetric history, unspecified trimester: Secondary | ICD-10-CM | POA: Diagnosis not present

## 2023-03-11 DIAGNOSIS — Z141 Cystic fibrosis carrier: Secondary | ICD-10-CM | POA: Insufficient documentation

## 2023-03-11 DIAGNOSIS — Z363 Encounter for antenatal screening for malformations: Secondary | ICD-10-CM | POA: Insufficient documentation

## 2023-03-11 DIAGNOSIS — O09292 Supervision of pregnancy with other poor reproductive or obstetric history, second trimester: Secondary | ICD-10-CM | POA: Insufficient documentation

## 2023-03-11 DIAGNOSIS — Z8759 Personal history of other complications of pregnancy, childbirth and the puerperium: Secondary | ICD-10-CM | POA: Insufficient documentation

## 2023-03-11 DIAGNOSIS — Z3A19 19 weeks gestation of pregnancy: Secondary | ICD-10-CM

## 2023-03-11 DIAGNOSIS — O34219 Maternal care for unspecified type scar from previous cesarean delivery: Secondary | ICD-10-CM

## 2023-03-11 DIAGNOSIS — O99210 Obesity complicating pregnancy, unspecified trimester: Secondary | ICD-10-CM

## 2023-03-11 DIAGNOSIS — O99212 Obesity complicating pregnancy, second trimester: Secondary | ICD-10-CM | POA: Diagnosis not present

## 2023-03-11 DIAGNOSIS — Q2112 Patent foramen ovale: Secondary | ICD-10-CM

## 2023-03-11 DIAGNOSIS — Z148 Genetic carrier of other disease: Secondary | ICD-10-CM | POA: Insufficient documentation

## 2023-03-11 NOTE — Progress Notes (Signed)
 Patient information  Patient Name: Sandra Harding  Patient MRN:   [id number]  Referring practice: MFM Referring Provider: Dewy Rose - [place]  MFM CONSULT  BARBARAANN AVANS is a 28 y.o. [phone] at [redacted]w[redacted]d here for ultrasound and consultation. 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    RE history of strokelike symptoms and a patent foramen ovale: The patient reports that since age 70 during her first pregnancy she had strokelike symptoms.  A full workup revealed a patent foramen ovale.  She reports that she never had a confirmed stroke and was told that these are likely TIAs.  She later saw neurology and was diagnosed with intractable hemiplegic migraine.  She was told to take some medication but declined.  She reports similar symptoms during this pregnancy but not to the same frequency as before.  She reports that she is able to monitor her symptoms and feels very educated on what to report to her neurologist.  She was also never told she needed anticoagulation due to her PFO.  RE cystic fibrosis carrier: FOB status is unknown.  She plans on having him tested through her OB providers office.  RE history of gestational hypertension: We discussed the risk of recurrence and the need for aspirin, 81 mg to reduce the risk of preeclampsia.  Patient reports that she was also instructed to take this but is yet to start.  I emphasized the importance of taking this to reduce the risk of preeclampsia.  Sonographic findings Single intrauterine pregnancy at 19w 3d  Fetal cardiac activity:   Observed and appears normal. Presentation: Cephalic. The anatomic structures that were well seen appear normal without evidence of soft markers. Due to poor acoustic windows some structures remain suboptimally visualized. Fetal biometry shows the estimated fetal weight at the 31 percentile.  Amniotic fluid: Within normal limits.  MVP: 3.04 cm. Placenta: Right lateral. Adnexa: No abnormality visualized. Cervical length: 3.5 cm.  There are limitations of prenatal ultrasound such as the inability to detect certain abnormalities due to poor visualization. Various factors such as fetal position, gestational age and maternal body habitus may increase the difficulty in visualizing the fetal anatomy.    Recommendations -EDD should be 08/02/2023 based on  LMP  (10/26/22). -Follow up ultrasound in 4-6 weeks to attempt visualization of the anatomy not seen and reassess the fetal growth -Baseline preeclampsia labs: CMP, CBC and urine protein/creatinine ratio if not previously completed.  -Continue Aspirin 81 mg for preeclampsia prophylaxis -Serial growth ultrasounds starting around 28 weeks to monitor for fetal growth restriction -Delivery timing pending clinical course but likely around EDD or sooner if indicated -Continue routine prenatal care with referring OB provider  Review of Systems: A review of systems was performed and was negative except per HPI   Vitals and Physical Exam    03/11/2023   10:02 AM 01/08/2023   10:05 AM 07/15/2022   10:26 AM  Vitals with BMI  Height   5\' 2"   Weight  181 lbs 190 lbs  BMI   34.74  Systolic 127 124 147  Diastolic 63 78 80  Pulse 74  75 50    Sitting comfortably on the sonogram table Nonlabored breathing Normal rate and rhythm Abdomen is nontender  Past pregnancies OB History  Gravida Para Term Preterm AB Living  4 3 3   3   SAB IAB Ectopic Multiple Live Births     0 3    # Outcome Date GA Lbr Len/2nd Weight Sex Type Anes PTL Lv  4 Current            3 Term 05/02/22 [redacted]w[redacted]d  6 lb 10 oz (3.005 kg)  VBAC   LIV  2 Term 01/19/18 [redacted]w[redacted]d / 01:28 7 lb 8.8 oz (3.425 kg) M VBAC EPI  LIV  1 Term 09/27/13 [redacted]w[redacted]d  5 lb 10.7 oz (2.571 kg) F CS-LTranv Gen  LIV     Complications: Fetal Intolerance    I spent 30 minutes reviewing the patients chart, including labs and images as well as counseling the patient about her medical conditions. Greater than 50% of the time was spent in direct face-to-face patient counseling.  Braxton Feathers  MFM, [facility]   03/11/2023  11:27 AM

## 2023-03-17 ENCOUNTER — Ambulatory Visit (INDEPENDENT_AMBULATORY_CARE_PROVIDER_SITE_OTHER): Payer: Medicaid Other | Admitting: Obstetrics & Gynecology

## 2023-03-17 VITALS — BP 110/76 | HR 76 | Wt 180.0 lb

## 2023-03-17 DIAGNOSIS — Z3A20 20 weeks gestation of pregnancy: Secondary | ICD-10-CM

## 2023-03-17 DIAGNOSIS — Z8759 Personal history of other complications of pregnancy, childbirth and the puerperium: Secondary | ICD-10-CM | POA: Diagnosis not present

## 2023-03-17 DIAGNOSIS — Z3480 Encounter for supervision of other normal pregnancy, unspecified trimester: Secondary | ICD-10-CM

## 2023-03-17 DIAGNOSIS — Z3482 Encounter for supervision of other normal pregnancy, second trimester: Secondary | ICD-10-CM | POA: Diagnosis not present

## 2023-03-17 DIAGNOSIS — Z141 Cystic fibrosis carrier: Secondary | ICD-10-CM | POA: Diagnosis not present

## 2023-03-17 NOTE — Progress Notes (Cosign Needed Addendum)
   PRENATAL VISIT NOTE  Subjective:  Sandra Harding is a 28 y.o. [phone] at [redacted]w[redacted]d being seen today for ongoing prenatal care.  She is currently monitored for the following issues for this low-risk pregnancy and has Cystic fibrosis carrier; Intractable hemiplegic migraine without status migrainosus; PFO (patent foramen ovale); Impetigo; Supervision of other normal pregnancy, antepartum; History of gestational hypertension; and History of cesarean section on their problem list.  Patient reports no complaints.  Contractions: Not present. Vag. Bleeding: None.  Movement: Present. Denies leaking of fluid.   The following portions of the patient's history were reviewed and updated as appropriate: allergies, current medications, past family history, past medical history, past social history, past surgical history and problem list.   Objective:   Vitals:   03/17/23 0928  BP: 110/76  Pulse: 76  Weight: 81.6 kg    Fetal Status: Fetal Heart Rate (bpm): 148   Movement: Present     General:  Alert, oriented and cooperative. Patient is in no acute distress.  Skin: Skin is warm and dry. No rash noted.   Cardiovascular: Normal heart rate noted  Respiratory: Normal respiratory effort, no problems with respiration noted  Abdomen: Soft, gravid, appropriate for gestational age.  Pain/Pressure: Absent     Pelvic: Cervical exam deferred        Extremities: Normal range of motion.  Edema: None  Mental Status: Normal mood and affect. Normal behavior. Normal judgment and thought content.   Assessment and Plan:  Pregnancy: G4P3003 at [redacted]w[redacted]d  1. History of gestational hypertension (Primary) -started taking ASA 2 weeks ago -baseline labs today  2. Supervision of other normal pregnancy, antepartum -starting to feel FM -AFP today -discussed 28 wk labs and GTT next visit -discussed babyscripts & visit scheduling; she's checking BP every 2 weeks but forgets to put in the app  3. Cystic fibrosis  carrier -FOB not yet tested, will facilitate    Preterm labor symptoms and general obstetric precautions including but not limited to vaginal bleeding, contractions, leaking of fluid and fetal movement were reviewed in detail with the patient. Please refer to After Visit Summary for other counseling recommendations.   No follow-ups on file.  Future Appointments  Date Time Provider Department Center  04/15/2023  9:30 AM [facility] US3 [facility]    Marylynn Pearson, RN   Attestation of Supervision of Student:  I confirm that I have verified the information documented in the nurse midwife student's note and that I have also personally reperformed the history, physical exam and all medical decision making activities.  I have verified that all services and findings are accurately documented in this student's note; and I agree with management and plan as outlined in the documentation. I have also made any necessary editorial changes.

## 2023-03-17 NOTE — Progress Notes (Signed)
 ROB: denies any concerns

## 2023-03-18 LAB — PROTEIN / CREATININE RATIO, URINE
Creatinine, Urine: 276.3 mg/dL
Protein, Ur: 20.9 mg/dL
Protein/Creat Ratio: 76 mg/g{creat} (ref 0–200)

## 2023-03-18 LAB — CBC
Hematocrit: 33.7 % — ABNORMAL LOW (ref 34.0–46.6)
Hemoglobin: 11.5 g/dL (ref 11.1–15.9)
MCH: 30.4 pg (ref 26.6–33.0)
MCHC: 34.1 g/dL (ref 31.5–35.7)
MCV: 89 fL (ref 79–97)
Platelets: 261 10*3/uL (ref 150–450)
RBC: 3.78 x10E6/uL (ref 3.77–5.28)
RDW: 12.2 % (ref 11.7–15.4)
WBC: 8.9 10*3/uL (ref 3.4–10.8)

## 2023-03-18 LAB — COMPREHENSIVE METABOLIC PANEL
ALT: 12 IU/L (ref 0–32)
AST: 15 IU/L (ref 0–40)
Albumin: 3.9 g/dL — ABNORMAL LOW (ref 4.0–5.0)
Alkaline Phosphatase: 70 IU/L (ref 44–121)
BUN/Creatinine Ratio: 11 (ref 9–23)
BUN: 6 mg/dL (ref 6–20)
Bilirubin Total: <0.2 mg/dL (ref 0.0–1.2)
CO2: 18 mmol/L — ABNORMAL LOW (ref 20–29)
Calcium: 9.5 mg/dL (ref 8.7–10.2)
Chloride: 103 mmol/L (ref 96–106)
Creatinine, Ser: 0.54 mg/dL — ABNORMAL LOW (ref 0.57–1.00)
Globulin, Total: 2.9 g/dL (ref 1.5–4.5)
Glucose: 71 mg/dL (ref 70–99)
Potassium: 4.1 mmol/L (ref 3.5–5.2)
Sodium: 139 mmol/L (ref 134–144)
Total Protein: 6.8 g/dL (ref 6.0–8.5)
eGFR: 128 mL/min/{1.73_m2} (ref >59)

## 2023-03-24 ENCOUNTER — Encounter: Payer: Self-pay | Admitting: Obstetrics & Gynecology

## 2023-04-15 ENCOUNTER — Ambulatory Visit: Payer: Medicaid Other | Attending: Internal Medicine

## 2023-04-15 DIAGNOSIS — E669 Obesity, unspecified: Secondary | ICD-10-CM | POA: Diagnosis not present

## 2023-04-15 DIAGNOSIS — Z148 Genetic carrier of other disease: Secondary | ICD-10-CM | POA: Diagnosis not present

## 2023-04-15 DIAGNOSIS — O09292 Supervision of pregnancy with other poor reproductive or obstetric history, second trimester: Secondary | ICD-10-CM | POA: Diagnosis not present

## 2023-04-15 DIAGNOSIS — Z8759 Personal history of other complications of pregnancy, childbirth and the puerperium: Secondary | ICD-10-CM | POA: Diagnosis present

## 2023-04-15 DIAGNOSIS — O99212 Obesity complicating pregnancy, second trimester: Secondary | ICD-10-CM | POA: Diagnosis not present

## 2023-04-15 DIAGNOSIS — O34219 Maternal care for unspecified type scar from previous cesarean delivery: Secondary | ICD-10-CM

## 2023-04-15 DIAGNOSIS — O99210 Obesity complicating pregnancy, unspecified trimester: Secondary | ICD-10-CM

## 2023-04-15 DIAGNOSIS — Z3A24 24 weeks gestation of pregnancy: Secondary | ICD-10-CM

## 2023-05-12 ENCOUNTER — Encounter: Payer: Medicaid Other | Admitting: Obstetrics & Gynecology

## 2023-05-26 ENCOUNTER — Ambulatory Visit (INDEPENDENT_AMBULATORY_CARE_PROVIDER_SITE_OTHER): Admitting: Obstetrics & Gynecology

## 2023-05-26 ENCOUNTER — Other Ambulatory Visit: Payer: Self-pay | Admitting: Obstetrics & Gynecology

## 2023-05-26 VITALS — BP 116/76 | HR 82 | Wt 200.0 lb

## 2023-05-26 DIAGNOSIS — Z3A30 30 weeks gestation of pregnancy: Secondary | ICD-10-CM

## 2023-05-26 DIAGNOSIS — O26843 Uterine size-date discrepancy, third trimester: Secondary | ICD-10-CM | POA: Diagnosis not present

## 2023-05-26 DIAGNOSIS — Z8759 Personal history of other complications of pregnancy, childbirth and the puerperium: Secondary | ICD-10-CM | POA: Diagnosis not present

## 2023-05-26 DIAGNOSIS — Z3480 Encounter for supervision of other normal pregnancy, unspecified trimester: Secondary | ICD-10-CM | POA: Diagnosis not present

## 2023-05-26 DIAGNOSIS — Z23 Encounter for immunization: Secondary | ICD-10-CM | POA: Diagnosis not present

## 2023-05-26 DIAGNOSIS — Z3483 Encounter for supervision of other normal pregnancy, third trimester: Secondary | ICD-10-CM | POA: Diagnosis not present

## 2023-05-26 NOTE — Assessment & Plan Note (Signed)
 Baseline labs nml

## 2023-05-26 NOTE — Progress Notes (Signed)
   PRENATAL VISIT NOTE  Subjective:  Sandra Harding is a 28 y.o. [phone] at [redacted]w[redacted]d being seen today for ongoing prenatal care.  She is currently monitored for the following issues for this low-risk pregnancy and has Cystic fibrosis carrier; Intractable hemiplegic migraine without status migrainosus; PFO (patent foramen ovale); Impetigo; Supervision of other normal pregnancy, antepartum; History of gestational hypertension; and History of cesarean section on their problem list.  Patient reports no complaints.  Contractions: Not present. Vag. Bleeding: None.  Movement: Present. Denies leaking of fluid.   The following portions of the patient's history were reviewed and updated as appropriate: allergies, current medications, past family history, past medical history, past social history, past surgical history and problem list.   Objective:   Vitals:   05/26/23 0820  BP: 116/76  Pulse: 82  Weight: 200 lb (90.7 kg)    Fetal Status: Fetal Heart Rate (bpm): 144   Movement: Present     General:  Alert, oriented and cooperative. Patient is in no acute distress.  Skin: Skin is warm and dry. No rash noted.   Cardiovascular: Normal heart rate noted  Respiratory: Normal respiratory effort, no problems with respiration noted  Abdomen: Soft, gravid, appropriate for gestational age.  Pain/Pressure: Present     Pelvic: Cervical exam deferred        Extremities: Normal range of motion.  Edema: Trace  Mental Status: Normal mood and affect. Normal behavior. Normal judgment and thought content.   Assessment and Plan:  Pregnancy: G4P3003 at [redacted]w[redacted]d   BTL papers today. S>D; US  for growth. Pt states she feels larger than prior pregnancies.  28 week labs today.  Baby Rx optimized schedule--BPs normal and crossing Hx Gest HTN--continue takeing BP and daily 81 mg asa CRF carrier--did not receive home testing kit--RN will investigate and give home kit.   Preterm labor symptoms and general obstetric  precautions including but not limited to vaginal bleeding, contractions, leaking of fluid and fetal movement were reviewed in detail with the patient. Please refer to After Visit Summary for other counseling recommendations.     Glorietta Lark, MD

## 2023-05-27 LAB — GLUCOSE TOLERANCE, 2 HOURS W/ 1HR
Glucose, 1 hour: 164 mg/dL (ref 70–179)
Glucose, 2 hour: 118 mg/dL (ref 70–152)
Glucose, Fasting: 83 mg/dL (ref 70–91)

## 2023-05-27 LAB — HIV ANTIBODY (ROUTINE TESTING W REFLEX): HIV Screen 4th Generation wRfx: NONREACTIVE

## 2023-05-27 LAB — CBC
Hematocrit: 32.3 % — ABNORMAL LOW (ref 34.0–46.6)
Hemoglobin: 11 g/dL — ABNORMAL LOW (ref 11.1–15.9)
MCH: 30.1 pg (ref 26.6–33.0)
MCHC: 34.1 g/dL (ref 31.5–35.7)
MCV: 89 fL (ref 79–97)
Platelets: 228 10*3/uL (ref 150–450)
RBC: 3.65 x10E6/uL — ABNORMAL LOW (ref 3.77–5.28)
RDW: 10.7 % — ABNORMAL LOW (ref 11.7–15.4)
WBC: 7.6 10*3/uL (ref 3.4–10.8)

## 2023-05-27 LAB — RPR: RPR Ser Ql: NONREACTIVE

## 2023-05-28 ENCOUNTER — Telehealth: Payer: Self-pay

## 2023-05-28 NOTE — Telephone Encounter (Signed)
 Called [facility] to add Ferritin to her labs. Per Belinda at [facility], the specimen will be pulled and Ferritin will be added.  Rachana Malesky l Amir Glaus, CMA

## 2023-05-28 NOTE — Telephone Encounter (Signed)
-----   Message from Glorietta Lark sent at 05/28/2023  1:36 PM EDT ----- Please add on a ferritin to her labs.    Thanks,  [facility]

## 2023-05-29 LAB — SPECIMEN STATUS REPORT

## 2023-05-29 LAB — FERRITIN: Ferritin: 10 ng/mL — ABNORMAL LOW (ref 15–150)

## 2023-05-30 ENCOUNTER — Encounter: Payer: Self-pay | Admitting: Obstetrics & Gynecology

## 2023-05-30 DIAGNOSIS — O99019 Anemia complicating pregnancy, unspecified trimester: Secondary | ICD-10-CM | POA: Insufficient documentation

## 2023-06-10 ENCOUNTER — Ambulatory Visit: Admitting: Obstetrics and Gynecology

## 2023-06-10 VITALS — BP 129/79 | HR 68 | Wt 204.1 lb

## 2023-06-10 DIAGNOSIS — Z3480 Encounter for supervision of other normal pregnancy, unspecified trimester: Secondary | ICD-10-CM | POA: Diagnosis not present

## 2023-06-10 NOTE — Progress Notes (Signed)
   PRENATAL VISIT NOTE  Subjective:  Sandra Harding is a 28 y.o. [phone] at [redacted]w[redacted]d being seen today for ongoing prenatal care.  She is currently monitored for the following issues for this low-risk pregnancy and has Cystic fibrosis carrier; Intractable hemiplegic migraine without status migrainosus; PFO (patent foramen ovale); Impetigo; Supervision of other normal pregnancy, antepartum; History of gestational hypertension; History of cesarean section; and Anemia of mother in pregnancy on their problem list.  Patient reports no complaints.  Contractions: Not present. Vag. Bleeding: None.  Movement: Present. Denies leaking of fluid.   The following portions of the patient's history were reviewed and updated as appropriate: allergies, current medications, past family history, past medical history, past social history, past surgical history and problem list.   Objective:    Vitals:   06/10/23 1055  BP: 129/79  Pulse: 68  Weight: 204 lb 1.9 oz (92.6 kg)    Fetal Status:  Fetal Heart Rate (bpm): 142   Movement: Present    General: Alert, oriented and cooperative. Patient is in no acute distress.  Skin: Skin is warm and dry. No rash noted.   Cardiovascular: Normal heart rate noted  Respiratory: Normal respiratory effort, no problems with respiration noted  Abdomen: Soft, gravid, appropriate for gestational age.  Pain/Pressure: Present     Pelvic: Cervical exam deferred        Extremities: Normal range of motion.  Edema: Trace  Mental Status: Normal mood and affect. Normal behavior. Normal judgment and thought content.   Assessment and Plan:  Pregnancy: G4P3003 at [redacted]w[redacted]d   1. Supervision of other normal pregnancy, antepartum (Primary)  Tubal papers signed  BP normal - taking BASA  Growth US  scheduled for S>D Fundal height appropriate today @ 34 cm   Preterm labor symptoms and general obstetric precautions including but not limited to vaginal bleeding, contractions, leaking of fluid  and fetal movement were reviewed in detail with the patient. Please refer to After Visit Summary for other counseling recommendations.   No follow-ups on file.  Future Appointments  Date Time Provider Department Center  06/30/2023  8:00 AM [facility] PROVIDER 1 [facility]  06/30/2023  8:30 AM [facility] US2 [facility]  07/07/2023  8:10 AM Rik Chasten, MD [facility]  07/21/2023  9:30 AM Rik Chasten, MD [facility]    Almond Jaffe, NP

## 2023-06-24 ENCOUNTER — Encounter: Admitting: Obstetrics and Gynecology

## 2023-06-30 ENCOUNTER — Ambulatory Visit

## 2023-06-30 ENCOUNTER — Ambulatory Visit: Attending: Obstetrics and Gynecology | Admitting: Obstetrics and Gynecology

## 2023-06-30 VITALS — BP 131/85 | HR 73

## 2023-06-30 DIAGNOSIS — O34219 Maternal care for unspecified type scar from previous cesarean delivery: Secondary | ICD-10-CM

## 2023-06-30 DIAGNOSIS — Z364 Encounter for antenatal screening for fetal growth retardation: Secondary | ICD-10-CM | POA: Diagnosis not present

## 2023-06-30 DIAGNOSIS — Z3A35 35 weeks gestation of pregnancy: Secondary | ICD-10-CM

## 2023-06-30 DIAGNOSIS — Z98891 History of uterine scar from previous surgery: Secondary | ICD-10-CM

## 2023-06-30 DIAGNOSIS — E669 Obesity, unspecified: Secondary | ICD-10-CM | POA: Diagnosis not present

## 2023-06-30 DIAGNOSIS — O26843 Uterine size-date discrepancy, third trimester: Secondary | ICD-10-CM

## 2023-06-30 DIAGNOSIS — O99013 Anemia complicating pregnancy, third trimester: Secondary | ICD-10-CM

## 2023-06-30 DIAGNOSIS — O99213 Obesity complicating pregnancy, third trimester: Secondary | ICD-10-CM | POA: Diagnosis not present

## 2023-06-30 DIAGNOSIS — Z8759 Personal history of other complications of pregnancy, childbirth and the puerperium: Secondary | ICD-10-CM | POA: Diagnosis not present

## 2023-06-30 DIAGNOSIS — Z3480 Encounter for supervision of other normal pregnancy, unspecified trimester: Secondary | ICD-10-CM

## 2023-06-30 NOTE — Progress Notes (Signed)
 Maternal-Fetal Medicine Consultation Name: Boots Mcglown MRN: [id number]  G4 P3003 at 35w 2d gestation.  Patient is here for fetal growth assessment because of uterine size date discrepancy at your office examination. She does not have gestational diabetes.  Blood pressure today at our office is 131/85 mmHg.  Obstetrical history is significant for a term cesarean delivery followed by 2 vaginal births.  Ultrasound Fetal growth is appropriate for gestational age.  Amniotic fluid is normal good fetal activity seen.  Cephalic presentation. Placenta is anterior and there is no evidence of previa or placenta accreta spectrum.  I reassured the patient of normal fetal growth assessment.  Given that she had 2 vaginal births after cesarean delivery, she has a very high likelihood of VBAC. Patient reports her husband is planning to get vasectomy done for permanent sterilization.  She reported that she will have bilateral tubal sterilization if she happens to have repeat cesarean delivery. I discussed the option of postpartum tubal ligation that can be performed after vaginal delivery without difficulty.  I explained the procedure.   Recommendations - No follow-up appointments were made.  Consultation including face-to-face (more than 50%) counseling 10 minutes.

## 2023-07-06 ENCOUNTER — Other Ambulatory Visit: Payer: Self-pay

## 2023-07-06 ENCOUNTER — Inpatient Hospital Stay (HOSPITAL_COMMUNITY)
Admission: AD | Admit: 2023-07-06 | Discharge: 2023-07-06 | Disposition: A | Discharge status: Home / Self Care | Attending: Obstetrics & Gynecology | Admitting: Obstetrics & Gynecology

## 2023-07-06 ENCOUNTER — Encounter (HOSPITAL_COMMUNITY): Payer: Self-pay | Admitting: Obstetrics & Gynecology

## 2023-07-06 DIAGNOSIS — Z3689 Encounter for other specified antenatal screening: Secondary | ICD-10-CM

## 2023-07-06 DIAGNOSIS — O26893 Other specified pregnancy related conditions, third trimester: Secondary | ICD-10-CM | POA: Insufficient documentation

## 2023-07-06 DIAGNOSIS — O09293 Supervision of pregnancy with other poor reproductive or obstetric history, third trimester: Secondary | ICD-10-CM | POA: Insufficient documentation

## 2023-07-06 DIAGNOSIS — Z0130 Encounter for examination of blood pressure without abnormal findings: Secondary | ICD-10-CM

## 2023-07-06 DIAGNOSIS — O368130 Decreased fetal movements, third trimester, not applicable or unspecified: Secondary | ICD-10-CM | POA: Diagnosis not present

## 2023-07-06 DIAGNOSIS — R600 Localized edema: Secondary | ICD-10-CM | POA: Diagnosis present

## 2023-07-06 DIAGNOSIS — Z3A36 36 weeks gestation of pregnancy: Secondary | ICD-10-CM

## 2023-07-06 DIAGNOSIS — R519 Headache, unspecified: Secondary | ICD-10-CM | POA: Diagnosis present

## 2023-07-06 DIAGNOSIS — R030 Elevated blood-pressure reading, without diagnosis of hypertension: Secondary | ICD-10-CM | POA: Insufficient documentation

## 2023-07-06 LAB — COMPREHENSIVE METABOLIC PANEL WITH GFR
ALT: 17 U/L (ref 0–44)
AST: 19 U/L (ref 15–41)
Albumin: 2.4 g/dL — ABNORMAL LOW (ref 3.5–5.0)
Alkaline Phosphatase: 118 U/L (ref 38–126)
Anion gap: 8 (ref 5–15)
BUN: 8 mg/dL (ref 6–20)
CO2: 20 mmol/L — ABNORMAL LOW (ref 22–32)
Calcium: 9.3 mg/dL (ref 8.9–10.3)
Chloride: 107 mmol/L (ref 98–111)
Creatinine, Ser: 0.6 mg/dL (ref 0.44–1.00)
GFR, Estimated: >60 mL/min (ref >60)
Glucose, Bld: 97 mg/dL (ref 70–99)
Potassium: 3.9 mmol/L (ref 3.5–5.1)
Sodium: 135 mmol/L (ref 135–145)
Total Bilirubin: 0.4 mg/dL (ref 0.0–1.2)
Total Protein: 6.1 g/dL — ABNORMAL LOW (ref 6.5–8.1)

## 2023-07-06 LAB — URINALYSIS, ROUTINE W REFLEX MICROSCOPIC
Bilirubin Urine: NEGATIVE
Glucose, UA: NEGATIVE mg/dL
Hgb urine dipstick: NEGATIVE
Ketones, ur: NEGATIVE mg/dL
Leukocytes,Ua: NEGATIVE
Nitrite: NEGATIVE
Protein, ur: NEGATIVE mg/dL
Specific Gravity, Urine: 1.016 (ref 1.005–1.030)
pH: 6 (ref 5.0–8.0)

## 2023-07-06 LAB — CBC
HCT: 30.6 % — ABNORMAL LOW (ref 36.0–46.0)
Hemoglobin: 10.2 g/dL — ABNORMAL LOW (ref 12.0–15.0)
MCH: 30.1 pg (ref 26.0–34.0)
MCHC: 33.3 g/dL (ref 30.0–36.0)
MCV: 90.3 fL (ref 80.0–100.0)
Platelets: 217 10*3/uL (ref 150–400)
RBC: 3.39 MIL/uL — ABNORMAL LOW (ref 3.87–5.11)
RDW: 13.4 % (ref 11.5–15.5)
WBC: 7.6 10*3/uL (ref 4.0–10.5)
nRBC: 0 % (ref 0.0–0.2)

## 2023-07-06 LAB — PROTEIN / CREATININE RATIO, URINE
Creatinine, Urine: 88 mg/dL
Protein Creatinine Ratio: 0.13 mg/mg{creat} (ref 0.00–0.15)
Total Protein, Urine: 11 mg/dL

## 2023-07-06 NOTE — MAU Provider Note (Signed)
 History     CSN: [id number]  Arrival date and time: 07/06/23 0845   Event Date/Time   First Provider Initiated Contact with Patient 07/06/23 0930      Chief Complaint  Patient presents with   Decreased Fetal Movement   Hypertension   HPI  Ms.Sandra Harding is a 28 y.o. female [phone] @ [redacted]w[redacted]d here in MAU with complaints of elevated BP at home. She reports home BP 140/90's. She reports HA over the last few days, however the HA is not significant enough to take any medication. She has a mild HA now and declines tylenol . She denies vision changes. Does report decreased fetal movement. She does have an anterior placenta.   OB History     Gravida  4   Para  3   Term  3   Preterm      AB      Living  3      SAB      IAB      Ectopic      Multiple  0   Live Births  3           Past Medical History:  Diagnosis Date   Headache    PFO (patent foramen ovale)    Pregnancy induced hypertension    Vitamin B 12 deficiency     Past Surgical History:  Procedure Laterality Date   CESAREAN SECTION N/A 09/27/2013   Procedure: CESAREAN SECTION;  Surgeon: Rik Chasten, MD;  Location: [facility];  Service: Obstetrics;  Laterality: N/A;    Family History  Problem Relation Age of Onset   Cancer Maternal Grandfather    Diabetes Paternal Aunt    Heart attack Maternal Grandmother    Alzheimer's disease Paternal Grandfather    Cancer Maternal Uncle     Social History   Tobacco Use   Smoking status: Former   Smokeless tobacco: Never  Vaping Use   Vaping status: Former  Substance Use Topics   Alcohol use: No   Drug use: Not Currently    Types: Marijuana    Comment: last use seep 2023    Allergies:  Allergies  Allergen Reactions   Sulfa Antibiotics Hives and Rash    Medications Prior to Admission  Medication Sig Dispense Refill Last Dose/Taking   aspirin  EC 81 MG tablet Take 2 tablets (162 mg total) by mouth at bedtime. Start taking when you are [redacted] weeks  pregnant for rest of pregnancy for prevention of preeclampsia 180 tablet 3 07/05/2023   Ferrous Sulfate (IRON  PO) Take by mouth.   07/05/2023   Prenatal Vit-Fe Fumarate-FA (PRENATAL VITAMINS PO) Take by mouth.   07/05/2023   Results for orders placed or performed during the hospital encounter of 07/06/23 (from the past 48 hours)  Urinalysis, Routine w reflex microscopic -Urine, Clean Catch     Status: Abnormal   Collection Time: 07/06/23  9:27 AM  Result Value Ref Range   Color, Urine YELLOW YELLOW   APPearance HAZY (A) CLEAR   Specific Gravity, Urine 1.016 1.005 - 1.030   pH 6.0 5.0 - 8.0   Glucose, UA NEGATIVE NEGATIVE mg/dL   Hgb urine dipstick NEGATIVE NEGATIVE   Bilirubin Urine NEGATIVE NEGATIVE   Ketones, ur NEGATIVE NEGATIVE mg/dL   Protein, ur NEGATIVE NEGATIVE mg/dL   Nitrite NEGATIVE NEGATIVE   Leukocytes,Ua NEGATIVE NEGATIVE    Comment: Performed at [facility], [address], [place], [place] [address]  Protein / creatinine ratio,  urine     Status: None   Collection Time: 07/06/23  9:31 AM  Result Value Ref Range   Creatinine, Urine 88 mg/dL   Total Protein, Urine 11 mg/dL    Comment: NO NORMAL RANGE ESTABLISHED FOR THIS TEST   Protein Creatinine Ratio 0.13 0.00 - 0.15 mg/mg[Cre]    Comment: Performed at [facility], [address] [place], [place] [address]  CBC     Status: Abnormal   Collection Time: 07/06/23  9:56 AM  Result Value Ref Range   WBC 7.6 4.0 - 10.5 K/uL   RBC 3.39 (L) 3.87 - 5.11 MIL/uL   Hemoglobin 10.2 (L) 12.0 - 15.0 g/dL   HCT 60.4 (L) 54.0 - 98.1 %   MCV 90.3 80.0 - 100.0 fL   MCH 30.1 26.0 - 34.0 pg   MCHC 33.3 30.0 - 36.0 g/dL   RDW 19.1 47.8 - 29.5 %   Platelets 217 150 - 400 K/uL   nRBC 0.0 0.0 - 0.2 %    Comment: Performed at [facility], [address] [place], [place] [address]  Comprehensive metabolic panel with GFR     Status: Abnormal   Collection Time: 07/06/23  9:56 AM  Result Value Ref Range   Sodium 135  135 - 145 mmol/L   Potassium 3.9 3.5 - 5.1 mmol/L   Chloride 107 98 - 111 mmol/L   CO2 20 (L) 22 - 32 mmol/L   Glucose, Bld 97 70 - 99 mg/dL    Comment: Glucose reference range applies only to samples taken after fasting for at least 8 hours.   BUN 8 6 - 20 mg/dL   Creatinine, Ser 8.65 0.44 - 1.00 mg/dL   Calcium 9.3 8.9 - 78.4 mg/dL   Total Protein 6.1 (L) 6.5 - 8.1 g/dL   Albumin 2.4 (L) 3.5 - 5.0 g/dL   AST 19 15 - 41 U/L   ALT 17 0 - 44 U/L   Alkaline Phosphatase 118 38 - 126 U/L   Total Bilirubin 0.4 0.0 - 1.2 mg/dL   GFR, Estimated >69 >62 mL/min    Comment: (NOTE) Calculated using the CKD-EPI Creatinine Equation (2021)    Anion gap 8 5 - 15    Comment: Performed at [facility], [address] [place], [place] [address]    Review of Systems  Constitutional:  Negative for fever.  Eyes:  Negative for photophobia and visual disturbance.  Gastrointestinal:  Negative for abdominal pain.  Neurological:  Positive for headaches.   Physical Exam   Blood pressure 131/84, pulse 72, temperature (!) 97.4 F (36.3 C), temperature source Oral, resp. rate 18, height 5' 2 (1.575 m), weight 95.6 kg, last menstrual period 10/26/2022, SpO2 99%, not currently breastfeeding.  Patient Vitals for the past 24 hrs:  BP Temp Temp src Pulse Resp SpO2 Height Weight  07/06/23 1015 128/78 -- -- (!) 59 -- -- -- --  07/06/23 1000 132/79 -- -- 63 -- -- -- --  07/06/23 0945 128/84 -- -- 71 -- -- -- --  07/06/23 0930 131/84 -- -- 72 -- -- -- --  07/06/23 0919 -- -- -- -- -- -- 5' 2 (1.575 m) 95.6 kg  07/06/23 0916 133/86 -- -- 79 -- -- -- --  07/06/23 0911 131/89 (!) 97.4 F (36.3 C) Oral 77 18 99 % -- --    Physical Exam Constitutional:      General: She is not in acute distress.  Appearance: Normal appearance. She is not ill-appearing, toxic-appearing or diaphoretic.  HENT:     Head: Normocephalic.   Musculoskeletal:     Right lower leg: No edema.     Left lower leg: No edema.    Skin:    General: Skin is warm.   Neurological:     Mental Status: She is alert and oriented to person, place, and time.     Deep Tendon Reflexes: Reflexes normal.     Comments: No clonus   Psychiatric:        Behavior: Behavior normal.    Fetal Tracing: Baseline: 120 bpm Variability: Moderate  Accelerations: 15x15 Decelerations: none Toco: None  MAU Course  Procedures  MDM  BP's normal in MAU Labs without acute findings. Nothing suggestive of preeclampsia.   Assessment and Plan   A:  1. Blood pressure check   2. Finding of above normal blood pressure   3. NST (non-stress test) reactive   4. [redacted] weeks gestation of pregnancy      P:  Dc home Reactive NST Keep your OB appointment tomorrow and take your BP cuff for calibration. Preeclampsia precautions  Stephenson Cichy, Juliette Oh, NP 07/06/2023 12:30 PM

## 2023-07-06 NOTE — MAU Note (Signed)
 Sandra Harding is a 29 y.o. at [redacted]w[redacted]d here in MAU reporting: had to be induced for [facility] with last baby at 37 weeks so has been monitoring pressures at home. This morning pressure was 140/82, called it in to nurse who advised she come in for evaluation. Reports headache 3/10 starting last night after work, tried eating which didn't help, has not tried any medicine for headache yet as it is not bad enough Complains of increased swelling in hands. Reports floaters two days ago but none right now, don't last long when she notices them.   Also notes decreased fetal movement since last night. Denies VB, LOF, or CTX.    Onset of complaint: last night Pain score: 3/10 head Vitals:   07/06/23 0911  BP: 131/89  Pulse: 77  Resp: 18  Temp: (!) 97.4 F (36.3 C)  SpO2: 99%     FHT: 125  Lab orders placed from triage: UA

## 2023-07-07 ENCOUNTER — Other Ambulatory Visit (HOSPITAL_COMMUNITY)
Admission: RE | Admit: 2023-07-07 | Discharge: 2023-07-07 | Disposition: A | Discharge status: Home / Self Care | Source: Ambulatory Visit | Attending: Obstetrics & Gynecology | Admitting: Obstetrics & Gynecology

## 2023-07-07 ENCOUNTER — Ambulatory Visit (INDEPENDENT_AMBULATORY_CARE_PROVIDER_SITE_OTHER): Admitting: Obstetrics & Gynecology

## 2023-07-07 VITALS — BP 131/95 | HR 78 | Wt 208.0 lb

## 2023-07-07 DIAGNOSIS — Z8759 Personal history of other complications of pregnancy, childbirth and the puerperium: Secondary | ICD-10-CM | POA: Diagnosis not present

## 2023-07-07 DIAGNOSIS — Z141 Cystic fibrosis carrier: Secondary | ICD-10-CM | POA: Insufficient documentation

## 2023-07-07 DIAGNOSIS — O99013 Anemia complicating pregnancy, third trimester: Secondary | ICD-10-CM | POA: Diagnosis not present

## 2023-07-07 DIAGNOSIS — Z3480 Encounter for supervision of other normal pregnancy, unspecified trimester: Secondary | ICD-10-CM

## 2023-07-07 DIAGNOSIS — N898 Other specified noninflammatory disorders of vagina: Secondary | ICD-10-CM | POA: Insufficient documentation

## 2023-07-07 DIAGNOSIS — Z3483 Encounter for supervision of other normal pregnancy, third trimester: Secondary | ICD-10-CM

## 2023-07-07 DIAGNOSIS — Z3A36 36 weeks gestation of pregnancy: Secondary | ICD-10-CM

## 2023-07-07 DIAGNOSIS — Z1331 Encounter for screening for depression: Secondary | ICD-10-CM | POA: Diagnosis not present

## 2023-07-07 NOTE — Progress Notes (Signed)
   PRENATAL VISIT NOTE  Subjective:  Sandra Harding is a 28 y.o. [phone] at [redacted]w[redacted]d being seen today for ongoing prenatal care.  She is currently monitored for the following issues for this high-risk pregnancy and has Cystic fibrosis carrier; Intractable hemiplegic migraine without status migrainosus; PFO (patent foramen ovale); Impetigo; Supervision of other normal pregnancy, antepartum; History of gestational hypertension; History of cesarean section; and Anemia of mother in pregnancy on their problem list.  Patient reports going to MAU with decreased FM and HA.  R/O Pre E.  Contractions: Not present. Vag. Bleeding: None.  Movement: Present. Denies leaking of fluid.   The following portions of the patient's history were reviewed and updated as appropriate: allergies, current medications, past family history, past medical history, past social history, past surgical history and problem list.   Objective:    Vitals:   07/07/23 0809 07/07/23 0813  BP: 121/84 (!) 131/95  Pulse: 77 78  Weight: 208 lb (94.3 kg)     Fetal Status:      Movement: Present    General: Alert, oriented and cooperative. Patient is in no acute distress.  Skin: Skin is warm and dry. No rash noted.   Cardiovascular: Normal heart rate noted  Respiratory: Normal respiratory effort, no problems with respiration noted  Abdomen: Soft, gravid, appropriate for gestational age.  Pain/Pressure: Present     Pelvic: Cervical exam performed in the presence of a chaperone      1/50/tone/vertex/high  Extremities: Normal range of motion.  Edema: Trace  Mental Status: Normal mood and affect. Normal behavior. Normal judgment and thought content.   Assessment and Plan:  Pregnancy: G4P3003 at [redacted]w[redacted]d 1. [redacted] weeks gestation of pregnancy (Primary)   2. Vaginal discharge Aptima for BV and Yeast as well as 36 weeks GC/Chlam +milky discharge seen.   3. History of gestational hypertension BP cuff at home is erroneous--10 points higher.   Will stop using cuff for home BPs.   4. Anemia during pregnancy in third trimester Taking iron .  Check CBC  5. Supervision of other normal pregnancy, antepartum For VBAC  Term labor symptoms and general obstetric precautions including but not limited to vaginal bleeding, contractions, leaking of fluid and fetal movement were reviewed in detail with the patient. Please refer to After Visit Summary for other counseling recommendations.   No follow-ups on file.  Future Appointments  Date Time Provider Department Center  07/21/2023  9:30 AM Rik Chasten, MD [facility]    Glorietta Lark, MD

## 2023-07-08 ENCOUNTER — Ambulatory Visit: Payer: Self-pay | Admitting: Obstetrics & Gynecology

## 2023-07-08 ENCOUNTER — Other Ambulatory Visit: Payer: Self-pay | Admitting: Obstetrics & Gynecology

## 2023-07-08 LAB — CERVICOVAGINAL ANCILLARY ONLY
Bacterial Vaginitis (gardnerella): POSITIVE — AB
Candida Glabrata: NEGATIVE
Candida Vaginitis: NEGATIVE
Chlamydia: NEGATIVE
Comment: NEGATIVE
Comment: NEGATIVE
Comment: NEGATIVE
Comment: NEGATIVE
Comment: NORMAL
Neisseria Gonorrhea: NEGATIVE

## 2023-07-08 LAB — CBC
Hematocrit: 33.6 % — ABNORMAL LOW (ref 34.0–46.6)
Hemoglobin: 11.2 g/dL (ref 11.1–15.9)
MCH: 30.4 pg (ref 26.6–33.0)
MCHC: 33.3 g/dL (ref 31.5–35.7)
MCV: 91 fL (ref 79–97)
Platelets: 217 10*3/uL (ref 150–450)
RBC: 3.69 x10E6/uL — ABNORMAL LOW (ref 3.77–5.28)
RDW: 12.5 % (ref 11.7–15.4)
WBC: 7.5 10*3/uL (ref 3.4–10.8)

## 2023-07-08 MED ORDER — METRONIDAZOLE 500 MG PO TABS: 500.0000 mg | ORAL_TABLET | Freq: Two times a day (BID) | ORAL | 0 refills | Status: DC

## 2023-07-08 NOTE — Progress Notes (Signed)
Flagyl for BV

## 2023-07-09 ENCOUNTER — Telehealth: Payer: Self-pay

## 2023-07-09 LAB — STREP GP B NAA: Strep Gp B NAA: NEGATIVE

## 2023-07-09 NOTE — Telephone Encounter (Signed)
 RN received notification that results and message sent by provider not read by patient. RN called and reviewed provider recommendations with patient and that medication had been sent to pharmacy.   Evelia Hipp, RN

## 2023-07-16 ENCOUNTER — Ambulatory Visit: Admitting: Obstetrics and Gynecology

## 2023-07-16 VITALS — BP 120/82 | HR 87 | Wt 216.0 lb

## 2023-07-16 DIAGNOSIS — Z3A37 37 weeks gestation of pregnancy: Secondary | ICD-10-CM

## 2023-07-16 DIAGNOSIS — Z8759 Personal history of other complications of pregnancy, childbirth and the puerperium: Secondary | ICD-10-CM | POA: Diagnosis not present

## 2023-07-16 DIAGNOSIS — Z98891 History of uterine scar from previous surgery: Secondary | ICD-10-CM

## 2023-07-16 DIAGNOSIS — Z3480 Encounter for supervision of other normal pregnancy, unspecified trimester: Secondary | ICD-10-CM | POA: Diagnosis not present

## 2023-07-16 DIAGNOSIS — O99013 Anemia complicating pregnancy, third trimester: Secondary | ICD-10-CM

## 2023-07-16 DIAGNOSIS — Z141 Cystic fibrosis carrier: Secondary | ICD-10-CM

## 2023-07-16 NOTE — Progress Notes (Signed)
 Sandra Harding

## 2023-07-16 NOTE — Progress Notes (Signed)
   PRENATAL VISIT NOTE  Subjective:  Sandra Harding is a 28 y.o. [phone] at [redacted]w[redacted]d being seen today for ongoing prenatal care.  She is currently monitored for the following issues for this low-risk pregnancy and has Cystic fibrosis carrier; Intractable hemiplegic migraine without status migrainosus; PFO (patent foramen ovale); Impetigo; Supervision of other normal pregnancy, antepartum; History of gestational hypertension; History of cesarean section; and Anemia of mother in pregnancy on their problem list.  Patient reports cramping.  Contractions: Irritability. Vag. Bleeding: None.  Movement: Present. Denies leaking of fluid.   The following portions of the patient's history were reviewed and updated as appropriate: allergies, current medications, past family history, past medical history, past social history, past surgical history and problem list.   Objective:   Vitals:   07/16/23 1348  BP: 120/82  Pulse: 87  Weight: 216 lb (98 kg)    Fetal Status: Fetal Heart Rate (bpm): 142   Movement: Present     General:  Alert, oriented and cooperative. Patient is in no acute distress.  Skin: Skin is warm and dry. No rash noted.   Cardiovascular: Normal heart rate noted  Respiratory: Normal respiratory effort, no problems with respiration noted  Abdomen: Soft, gravid, appropriate for gestational age.  Pain/Pressure: Present      Assessment and Plan:  Pregnancy: G4P3003 at [redacted]w[redacted]d 1. Supervision of other normal pregnancy, antepartum (Primary) 2. [redacted] weeks gestation of pregnancy GBS neg SCE performed in presence of chaperone  3. History of cesarean section G1 NRFHT 2571g > VBAC x 2 of 3425g and 3005g infant For TOLAC Partner plans vasectomy (scheduled 7/15). If she gets a CS she would like salpingectomy, too. Tubal papers signed 5/5  4. History of gestational hypertension Normotensive today, normal baseline labs Growth US  AGA, cephalic 6/9  5. Anemia during pregnancy in third  trimester Resolved w/ PO iron  - Hgb 11.2 at 36 weeks  6. Cystic fibrosis carrier Ultimately declines partner testing due to cost  Term labor symptoms and general obstetric precautions including but not limited to vaginal bleeding, contractions, leaking of fluid and fetal movement were reviewed in detail with the patient. Please refer to After Visit Summary for other counseling recommendations.   Return in about 1 week (around 07/23/2023) for return OB at 38 weeks.  Future Appointments  Date Time Provider Department Center  07/21/2023  9:30 AM Sandra Burnard DEL, MD [facility]  07/30/2023 10:30 AM Sandra Moccasin, MD [facility]    Sandra JAYSON Carolin, MD

## 2023-07-21 ENCOUNTER — Ambulatory Visit: Admitting: Obstetrics & Gynecology

## 2023-07-21 VITALS — BP 124/87 | HR 80 | Wt 213.0 lb

## 2023-07-21 DIAGNOSIS — Z3A38 38 weeks gestation of pregnancy: Secondary | ICD-10-CM | POA: Diagnosis not present

## 2023-07-21 DIAGNOSIS — Z3483 Encounter for supervision of other normal pregnancy, third trimester: Secondary | ICD-10-CM

## 2023-07-21 DIAGNOSIS — Z3480 Encounter for supervision of other normal pregnancy, unspecified trimester: Secondary | ICD-10-CM

## 2023-07-21 NOTE — Progress Notes (Signed)
   PRENATAL VISIT NOTE  Subjective:  Sandra Harding is a 28 y.o. [phone] at [redacted]w[redacted]d being seen today for ongoing prenatal care.  She is currently monitored for the following issues for this low-risk pregnancy and has Cystic fibrosis carrier; Intractable hemiplegic migraine without status migrainosus; PFO (patent foramen ovale); Impetigo; Supervision of other normal pregnancy, antepartum; History of gestational hypertension; History of cesarean section; and Anemia of mother in pregnancy on their problem list.  Patient reports occasional contractions.  Contractions: Irregular. Vag. Bleeding: None.  Movement: Present. Denies leaking of fluid.   The following portions of the patient's history were reviewed and updated as appropriate: allergies, current medications, past family history, past medical history, past social history, past surgical history and problem list.   Objective:    Vitals:   07/21/23 1518  BP: 124/87  Pulse: 80  Weight: 213 lb (96.6 kg)     Fetal Status:      Movement: Present    General: Alert, oriented and cooperative. Patient is in no acute distress.  Skin: Skin is warm and dry. No rash noted.   Cardiovascular: Normal heart rate noted  Respiratory: Normal respiratory effort, no problems with respiration noted  Abdomen: Soft, gravid, appropriate for gestational age.  Pain/Pressure: Present     Pelvic: Cervical exam performed in the presence of a chaperone        Extremities: Normal range of motion.  Edema: None  Mental Status: Normal mood and affect. Normal behavior. Normal judgment and thought content.   Assessment and Plan:  Pregnancy: G4P3003 at [redacted]w[redacted]d  1. Supervision of other normal pregnancy, antepartum (Primary) 2. [redacted] weeks gestation of pregnancy GBS neg SCE performed in presence of chaperone   3. History of cesarean section G1 NRFHT 2571g > VBAC x 2 of 3425g and 3005g infant For TOLAC Partner plans vasectomy (scheduled 7/15). If she gets a CS she would  like salpingectomy, too. Tubal papers signed 5/5   4. History of gestational hypertension Normotensive today, normal baseline labs Growth US  AGA, cephalic 6/9   5. Anemia during pregnancy in third trimester Resolved w/ PO iron  - Hgb 11.2 at 36 weeks   6. Cystic fibrosis carrier Ultimately declines partner testing due to cost Term labor symptoms and general obstetric precautions including but not limited to vaginal bleeding, contractions, leaking of fluid and fetal movement were reviewed in detail with the patient. Please refer to After Visit Summary for other counseling recommendations.   7.  FHT 115 Baseline:  115 to 120 at times Accelerations: present Decelerations: absent Variability: moderate Interpretation:  Reactive NST    Future Appointments  Date Time Provider Department Center  07/30/2023 10:30 AM Cleatus Moccasin, MD [facility]    Burnard Pate, MD

## 2023-07-29 ENCOUNTER — Inpatient Hospital Stay (HOSPITAL_COMMUNITY): Admitting: Anesthesiology

## 2023-07-29 ENCOUNTER — Other Ambulatory Visit: Payer: Self-pay

## 2023-07-29 ENCOUNTER — Encounter (HOSPITAL_COMMUNITY): Payer: Self-pay | Admitting: Obstetrics and Gynecology

## 2023-07-29 ENCOUNTER — Inpatient Hospital Stay (HOSPITAL_COMMUNITY)
Admission: AD | Admit: 2023-07-29 | Discharge: 2023-07-31 | DRG: 806 | Disposition: A | Discharge status: Home / Self Care | Attending: Obstetrics and Gynecology | Admitting: Obstetrics and Gynecology

## 2023-07-29 DIAGNOSIS — O34219 Maternal care for unspecified type scar from previous cesarean delivery: Secondary | ICD-10-CM | POA: Diagnosis present

## 2023-07-29 DIAGNOSIS — O4292 Full-term premature rupture of membranes, unspecified as to length of time between rupture and onset of labor: Secondary | ICD-10-CM | POA: Diagnosis present

## 2023-07-29 DIAGNOSIS — O34211 Maternal care for low transverse scar from previous cesarean delivery: Secondary | ICD-10-CM | POA: Diagnosis not present

## 2023-07-29 DIAGNOSIS — Q2112 Patent foramen ovale: Secondary | ICD-10-CM

## 2023-07-29 DIAGNOSIS — Z8249 Family history of ischemic heart disease and other diseases of the circulatory system: Secondary | ICD-10-CM | POA: Diagnosis not present

## 2023-07-29 DIAGNOSIS — Z833 Family history of diabetes mellitus: Secondary | ICD-10-CM | POA: Diagnosis not present

## 2023-07-29 DIAGNOSIS — Z87891 Personal history of nicotine dependence: Secondary | ICD-10-CM

## 2023-07-29 DIAGNOSIS — O4202 Full-term premature rupture of membranes, onset of labor within 24 hours of rupture: Secondary | ICD-10-CM | POA: Diagnosis not present

## 2023-07-29 DIAGNOSIS — O26893 Other specified pregnancy related conditions, third trimester: Secondary | ICD-10-CM | POA: Diagnosis present

## 2023-07-29 DIAGNOSIS — Z141 Cystic fibrosis carrier: Secondary | ICD-10-CM | POA: Diagnosis not present

## 2023-07-29 DIAGNOSIS — Z3A39 39 weeks gestation of pregnancy: Secondary | ICD-10-CM | POA: Diagnosis not present

## 2023-07-29 LAB — PROTEIN / CREATININE RATIO, URINE
Creatinine, Urine: 42 mg/dL
Total Protein, Urine: <6 mg/dL

## 2023-07-29 LAB — COMPREHENSIVE METABOLIC PANEL WITH GFR
ALT: 14 U/L (ref 0–44)
AST: 22 U/L (ref 15–41)
Albumin: 2.6 g/dL — ABNORMAL LOW (ref 3.5–5.0)
Alkaline Phosphatase: 126 U/L (ref 38–126)
Anion gap: 11 (ref 5–15)
BUN: 9 mg/dL (ref 6–20)
CO2: 19 mmol/L — ABNORMAL LOW (ref 22–32)
Calcium: 10.2 mg/dL (ref 8.9–10.3)
Chloride: 105 mmol/L (ref 98–111)
Creatinine, Ser: 0.7 mg/dL (ref 0.44–1.00)
GFR, Estimated: >60 mL/min (ref >60)
Glucose, Bld: 94 mg/dL (ref 70–99)
Potassium: 3.6 mmol/L (ref 3.5–5.1)
Sodium: 135 mmol/L (ref 135–145)
Total Bilirubin: 0.5 mg/dL (ref 0.0–1.2)
Total Protein: 6.6 g/dL (ref 6.5–8.1)

## 2023-07-29 LAB — CBC
HCT: 35.7 % — ABNORMAL LOW (ref 36.0–46.0)
Hemoglobin: 11.9 g/dL — ABNORMAL LOW (ref 12.0–15.0)
MCH: 30.9 pg (ref 26.0–34.0)
MCHC: 33.3 g/dL (ref 30.0–36.0)
MCV: 92.7 fL (ref 80.0–100.0)
Platelets: 190 K/uL (ref 150–400)
RBC: 3.85 MIL/uL — ABNORMAL LOW (ref 3.87–5.11)
RDW: 14 % (ref 11.5–15.5)
WBC: 8.1 K/uL (ref 4.0–10.5)
nRBC: 0 % (ref 0.0–0.2)

## 2023-07-29 LAB — RPR: RPR Ser Ql: NONREACTIVE

## 2023-07-29 LAB — TYPE AND SCREEN
ABO/RH(D): A POS
Antibody Screen: NEGATIVE

## 2023-07-29 LAB — POCT FERN TEST: POCT Fern Test: POSITIVE

## 2023-07-29 LAB — GLUCOSE, CAPILLARY: Glucose-Capillary: 83 mg/dL (ref 70–99)

## 2023-07-29 MED ORDER — OXYTOCIN-SODIUM CHLORIDE 30-0.9 UT/500ML-% IV SOLN
1.0000 m[IU]/min | INTRAVENOUS | Status: DC
  Administered 2023-07-29: 2 m[IU]/min via INTRAVENOUS

## 2023-07-29 MED ORDER — EPHEDRINE 5 MG/ML INJ: 10.0000 mg | INTRAVENOUS | Status: DC | PRN

## 2023-07-29 MED ORDER — DIPHENHYDRAMINE HCL 50 MG/ML IJ SOLN
12.5000 mg | Freq: Once | INTRAMUSCULAR | Status: AC
  Administered 2023-07-29: 12.5 mg via INTRAVENOUS
  Filled 2023-07-29: qty 1

## 2023-07-29 MED ORDER — FENTANYL CITRATE (PF) 100 MCG/2ML IJ SOLN: 50.0000 ug | INTRAMUSCULAR | Status: DC | PRN

## 2023-07-29 MED ORDER — CAFFEINE 200 MG PO TABS: 200.0000 mg | ORAL_TABLET | Freq: Once | ORAL | Status: DC

## 2023-07-29 MED ORDER — LABETALOL HCL 5 MG/ML IV SOLN: 20.0000 mg | INTRAVENOUS | Status: DC | PRN

## 2023-07-29 MED ORDER — TERBUTALINE SULFATE 1 MG/ML IJ SOLN
0.2500 mg | Freq: Once | INTRAMUSCULAR | Status: DC | PRN
Start: 2023-07-29 — End: 2023-07-30

## 2023-07-29 MED ORDER — CAFFEINE 200 MG PO TABS
100.0000 mg | ORAL_TABLET | Freq: Once | ORAL | Status: AC
  Administered 2023-07-29: 100 mg via ORAL
  Filled 2023-07-29 (×2): qty 1

## 2023-07-29 MED ORDER — METOCLOPRAMIDE HCL 5 MG/ML IJ SOLN
10.0000 mg | Freq: Once | INTRAMUSCULAR | Status: DC
Start: 2023-07-29 — End: 2023-07-29

## 2023-07-29 MED ORDER — DIPHENHYDRAMINE HCL 50 MG/ML IJ SOLN: 12.5000 mg | INTRAMUSCULAR | Status: DC | PRN

## 2023-07-29 MED ORDER — DIPHENHYDRAMINE HCL 50 MG/ML IJ SOLN
25.0000 mg | Freq: Once | INTRAMUSCULAR | Status: DC
Start: 2023-07-29 — End: 2023-07-29

## 2023-07-29 MED ORDER — PHENYLEPHRINE 80 MCG/ML (10ML) SYRINGE FOR IV PUSH (FOR BLOOD PRESSURE SUPPORT): 80.0000 ug | PREFILLED_SYRINGE | INTRAVENOUS | Status: DC | PRN

## 2023-07-29 MED ORDER — LIDOCAINE-EPINEPHRINE (PF) 2 %-1:200000 IJ SOLN
INTRAMUSCULAR | Status: DC | PRN
  Administered 2023-07-29: 3 mL via EPIDURAL

## 2023-07-29 MED ORDER — LABETALOL HCL 5 MG/ML IV SOLN: 40.0000 mg | INTRAVENOUS | Status: DC | PRN

## 2023-07-29 MED ORDER — ONDANSETRON HCL 4 MG/2ML IJ SOLN: 4.0000 mg | Freq: Four times a day (QID) | INTRAMUSCULAR | Status: DC | PRN

## 2023-07-29 MED ORDER — LIDOCAINE HCL (PF) 1 % IJ SOLN: 30.0000 mL | INTRAMUSCULAR | Status: DC | PRN

## 2023-07-29 MED ORDER — FENTANYL-BUPIVACAINE-NACL 0.5-0.125-0.9 MG/250ML-% EP SOLN: 12.0000 mL/h | EPIDURAL | Status: DC | PRN

## 2023-07-29 MED ORDER — MAGNESIUM SULFATE 2 GM/50ML IV SOLN
2.0000 g | Freq: Once | INTRAVENOUS | Status: AC
  Administered 2023-07-29: 2 g via INTRAVENOUS
  Filled 2023-07-29: qty 50

## 2023-07-29 MED ORDER — OXYTOCIN BOLUS FROM INFUSION
333.0000 mL | Freq: Once | INTRAVENOUS | Status: AC
  Administered 2023-07-30: 333 mL via INTRAVENOUS

## 2023-07-29 MED ORDER — LACTATED RINGERS IV SOLN
500.0000 mL | INTRAVENOUS | Status: DC | PRN
  Administered 2023-07-29: 1000 mL via INTRAVENOUS

## 2023-07-29 MED ORDER — LACTATED RINGERS IV SOLN: 500.0000 mL | Freq: Once | INTRAVENOUS | Status: DC

## 2023-07-29 MED ORDER — OXYTOCIN-SODIUM CHLORIDE 30-0.9 UT/500ML-% IV SOLN
2.5000 [IU]/h | INTRAVENOUS | Status: DC
  Filled 2023-07-29 (×2): qty 500

## 2023-07-29 MED ORDER — ACETAMINOPHEN-CAFFEINE 500-65 MG PO TABS
2.0000 | ORAL_TABLET | Freq: Once | ORAL | Status: DC
  Filled 2023-07-29: qty 2

## 2023-07-29 MED ORDER — EPHEDRINE 5 MG/ML INJ
10.0000 mg | INTRAVENOUS | Status: DC | PRN
Start: 2023-07-29 — End: 2023-07-30

## 2023-07-29 MED ORDER — METOCLOPRAMIDE HCL 5 MG/ML IJ SOLN
5.0000 mg | Freq: Once | INTRAMUSCULAR | Status: AC
  Administered 2023-07-29: 5 mg via INTRAVENOUS
  Filled 2023-07-29: qty 2

## 2023-07-29 MED ORDER — LACTATED RINGERS IV SOLN: INTRAVENOUS | Status: DC

## 2023-07-29 MED ORDER — ACETAMINOPHEN 325 MG PO TABS
650.0000 mg | ORAL_TABLET | ORAL | Status: DC | PRN
  Administered 2023-07-29: 650 mg via ORAL
  Filled 2023-07-29: qty 2

## 2023-07-29 MED ORDER — HYDRALAZINE HCL 20 MG/ML IJ SOLN: 10.0000 mg | INTRAMUSCULAR | Status: DC | PRN

## 2023-07-29 MED ORDER — SOD CITRATE-CITRIC ACID 500-334 MG/5ML PO SOLN: 30.0000 mL | ORAL | Status: DC | PRN

## 2023-07-29 MED ORDER — FENTANYL-BUPIVACAINE-NACL 0.5-0.125-0.9 MG/250ML-% EP SOLN
12.0000 mL/h | EPIDURAL | Status: DC | PRN
  Administered 2023-07-29: 12 mL/h via EPIDURAL
  Filled 2023-07-29: qty 250

## 2023-07-29 MED ORDER — LABETALOL HCL 5 MG/ML IV SOLN: 80.0000 mg | INTRAVENOUS | Status: DC | PRN

## 2023-07-29 NOTE — Progress Notes (Addendum)
 Patient ID: Sandra Harding, female   DOB: January 18, 1996, 28 y.o.   MRN: [id number] C/o migraine Has history of migraine. Tylenol  did not help  Vitals:   07/29/23 1901 07/29/23 1923 07/29/23 1930 07/29/23 2000  BP: 129/76  126/79 119/67  Pulse: 81  80 82  Resp:   17   Temp:  (!) 97.5 F (36.4 C)    TempSrc:  Oral    SpO2:       FHR stable UCs Irregular  Last exam: Dilation: 4.5 Effacement (%): 80 Cervical Position: Middle Station: -2 Presentation: Vertex Exam by:: lee  Will given migraine cocktail with Reglan , Benadryl  and Magnesium  sulfate 2gm

## 2023-07-29 NOTE — Anesthesia Preprocedure Evaluation (Addendum)
 Anesthesia Evaluation  Patient identified by MRN, date of birth, ID band Patient awake    Reviewed: Allergy & Precautions, Patient's Chart, lab work & pertinent test results  Airway Mallampati: II       Dental no notable dental hx.    Pulmonary former smoker   Pulmonary exam normal        Cardiovascular hypertension, Normal cardiovascular exam     Neuro/Psych    GI/Hepatic   Endo/Other    Renal/GU      Musculoskeletal   Abdominal   Peds  Hematology  (+) Blood dyscrasia, anemia   Anesthesia Other Findings   Reproductive/Obstetrics (+) Pregnancy                              Anesthesia Physical Anesthesia Plan  ASA: 2  Anesthesia Plan: Epidural   Post-op Pain Management:    Induction:   PONV Risk Score and Plan: 0  Airway Management Planned: Natural Airway  Additional Equipment: None  Intra-op Plan:   Post-operative Plan:   Informed Consent: I have reviewed the patients History and Physical, chart, labs and discussed the procedure including the risks, benefits and alternatives for the proposed anesthesia with the patient or authorized representative who has indicated his/her understanding and acceptance.       Plan Discussed with:   Anesthesia Plan Comments: (Lab Results      Component                Value               Date                      WBC                      8.1                 07/29/2023                HGB                      11.9 (L)            07/29/2023                HCT                      35.7 (L)            07/29/2023                MCV                      92.7                07/29/2023                PLT                      190                 07/29/2023           )        Anesthesia Quick Evaluation

## 2023-07-29 NOTE — Anesthesia Procedure Notes (Signed)
 Epidural Patient location during procedure: OB Start time: 07/29/2023 2:16 PM End time: 07/29/2023 2:22 PM  Staffing Anesthesiologist: Tilford Franky BIRCH, MD Performed: anesthesiologist   Preanesthetic Checklist Completed: patient identified, IV checked, site marked, risks and benefits discussed, surgical consent, monitors and equipment checked, pre-op evaluation and timeout performed  Epidural Patient position: sitting Prep: DuraPrep Patient monitoring: heart rate, continuous pulse ox and blood pressure Approach: midline Location: L3-L4 Injection technique: LOR saline  Needle:  Needle type: Tuohy  Needle gauge: 17 G Needle length: 9 cm Catheter type: closed end flexible Catheter size: 20 Guage Test dose: negative and 1.5% lidocaine   Assessment Events: blood not aspirated, no cerebrospinal fluid, injection not painful, no injection resistance and no paresthesia  Additional Notes LOR @ 5.5  Patient identified. Risks/Benefits/Options discussed with patient including but not limited to bleeding, infection, nerve damage, paralysis, failed block, incomplete pain control, headache, blood pressure changes, nausea, vomiting, reactions to medications, itching and postpartum back pain. Confirmed with bedside nurse the patient's most recent platelet count. Confirmed with patient that they are not currently taking any anticoagulation, have any bleeding history or any family history of bleeding disorders. Patient expressed understanding and wished to proceed. All questions were answered. Sterile technique was used throughout the entire procedure. Please see nursing notes for vital signs. Test dose was given through epidural catheter and negative prior to continuing to dose epidural or start infusion. Warning signs of high block given to the patient including shortness of breath, tingling/numbness in hands, complete motor block, or any concerning symptoms with instructions to call for help. Patient was  given instructions on fall risk and not to get out of bed. All questions and concerns addressed with instructions to call with any issues or inadequate analgesia.    Reason for block:procedure for pain

## 2023-07-29 NOTE — Progress Notes (Signed)
 Patient ID: Sandra Harding, female   DOB: 1995/03/14, 28 y.o.   MRN: [id number] Feels a little better after meds, headache not entirely gone  Vitals:   07/29/23 1601 07/29/23 1631 07/29/23 1701 07/29/23 1731  BP: 126/75 116/61 112/63 117/74   07/29/23 1801 07/29/23 1831 07/29/23 1901 07/29/23 1930  BP: 112/65 122/80 129/76 126/79   07/29/23 2000 07/29/23 2030 07/29/23 2100 07/29/23 2130  BP: 119/67 (!) 105/54 117/63 137/85    Will try giving Caffeine  100mg  to see if we can augment the headache

## 2023-07-29 NOTE — H&P (Signed)
 Sandra Harding is a 28 y.o. female, [phone] at 39.3 weeks, presenting for SROM at 108. Patient receives care at [facility] and was supervised for a high-risk pregnancy. Pregnancy and medical history significant for problems as listed below. She is GBS negative and expresses a desire for epidural for pain management.  She is anticipating a female infant and requests BTL for PP birth control method if she receives a C/S, but husband has vasectomy scheduled if not.     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 LOIS Pate.  NST Reactive  Vitals:    07/21/23 1518  BP: 124/87  Pulse: 80  Weight: 213 lb (96.6 kg)    NURSING  PROVIDER  Office Location [place] Dating by LMP c/w U/S at 10 wks  [facility] Model Traditional Anatomy U/S Normal  Initiated care at  [facility]                Language  English              LAB RESULTS   Support Person [employer] NIPS: LR XX AFP:     NT/IT (FT only)     Carrier Screen Horizon:   Rhogam  --/--/A POS (04/10 0803) A1C/GTT Early: 5.5 Third trimester:  Normal   Flu Vaccine     TDaP Vaccine  05/26/2023 Blood Type --/--/A POS (04/10 0803)  Covid Vaccine  Antibody NEG (04/10 0803)  RSV Vaccine  Rubella 5.12 (12/18 1036)  Feeding Plan breast RPR NON REACTIVE (04/10 0804)  Contraception Partner getting Vasectomy If CS would want BTL HBsAg NON-REACTIVE (12/18 1036)  Circumcision yes HIV Non Reactive (02/07 0922)  Pediatrician  Dr B[facility] HCVAb Negative (12/18 0000)  Prenatal Classes      BTL Consent Tubal if c/s otherwise vasectomy; papers signed 05/26/23 Pap Diagnosis  Date Value Ref Range Status  08/03/2020   Final   - Negative for Intraepithelial Lesions or Malignancy (NILM)  08/03/2020 - Benign reactive/reparative changes  Final    BTL Pre-payment  GC/CT Initial:  neg 36wks:    VBAC Consent  GBS NEGATIVE/-- (04/10 0953) For PCN allergy, check sensitivities   BRx Optimized? [x ] yes   [ ]  no    DME Rx [x ] BP cuff [ ]  Weight Scale Waterbirth  N/a  PHQ9 & GAD7 [ x ] new OB [  ] 28 weeks  [  ] 36 weeks Induction  [ ]  Orders Entered [ ] Foley Y/N     OB History     Gravida  4   Para  3   Term  3   Preterm      AB      Living  3      SAB      IAB      Ectopic      Multiple  0   Live Births  3             Past Medical History:  Diagnosis Date   Headache    PFO (patent  foramen ovale)    Pregnancy induced hypertension    Vitamin B 12 deficiency    Past Surgical History:  Procedure Laterality Date   CESAREAN SECTION N/A 09/27/2013   Procedure: CESAREAN SECTION;  Surgeon: Burnard VEAR Pate, MD;  Location: [facility];  Service: Obstetrics;  Laterality: N/A;   Family History: family history includes Alzheimer's disease in her paternal grandfather; Cancer in her maternal grandfather and maternal uncle; Diabetes in her paternal aunt; Heart attack in her maternal grandmother. Social History:  reports that she has quit smoking. She has never used smokeless tobacco. She reports that she does not currently use drugs after having used the following drugs: Marijuana. She reports that she does not drink alcohol.   Prenatal Transfer Tool  Maternal Diabetes: No Genetic Screening: Normal Maternal Ultrasounds/Referrals: Normal Fetal Ultrasounds or other Referrals:  None Maternal Substance Abuse:  No Significant Maternal Medications:  Meds include: Other: ASA, PNV,  Significant Maternal Lab Results: Group B Strep negative   Maternal Assessment:  ROS:  +Contractions, -LOF, -Vaginal Bleeding, +Fetal Movement  All other systems reviewed and negative.    Allergies  Allergen Reactions   Sulfa Antibiotics Hives and Rash     Dilation: 2.5 Effacement (%): 70 Station: -3 Exam by:: Chi Morris, RN Blood pressure (!) 140/101, pulse 79, temperature 97.8 F (36.6 C), temperature source Oral, resp. rate 18, last menstrual period 10/26/2022, SpO2 99%, not currently breastfeeding.   Vitals:   07/29/23 0531 07/29/23 0600 07/29/23 0637 07/29/23 0701  BP: (!) 150/97 (!) 150/102 (!) 151/95 (!) 143/96  Pulse: 67 66 60 60  Resp:      Temp:      TempSrc:      SpO2:  99%      Physical Exam Vitals and nursing note reviewed.  Constitutional:      Appearance: Normal appearance.  HENT:     Head: Normocephalic and atraumatic.  Eyes:     Conjunctiva/sclera: Conjunctivae normal.  Cardiovascular:     Rate and Rhythm: Normal rate.  Pulmonary:     Effort: Pulmonary effort is normal.  Abdominal:     Palpations: Abdomen is soft.     Tenderness: There is no abdominal tenderness.     Comments: Gravid  Musculoskeletal:        General: Normal range of motion.     Cervical back: Normal range of motion.  Skin:    General: Skin is warm and dry.  Neurological:     Mental Status: She is alert and oriented to person, place, and time.  Psychiatric:        Mood and Affect: Mood normal.        Behavior: Behavior normal.     Fetal Assessment: Leopolds: -Pelvis: Proven to 3425g -EFW: 2692g -Presentation: Vertex by nurse exam  FHR: 120 bpm, Mod Var, -Decels, +Accels UCs:  Palpates mild    Assessment IUP at 39.3 weeks Cat I FT Elevated BP with h/o GHTN GBS Negative H/O C/S H/O VBAC x 2   Plan: -Provider to bedside, in MAU, as prolonged decel noted but improved with position change.  -Admit to [facility]  -Routine Labor and Delivery Orders per Protocol -POC discussed *Expectant management currently *Reviewed augmentation with  pitocin , if necessary. -Okay for epidural when desired. -PreE labs ordered.      Harlene LITTIE Mock, MSN 07/29/2023, 5:21 AM

## 2023-07-29 NOTE — Progress Notes (Addendum)
 Patient ID: Sandra Harding, female   DOB: September 25, 1995, 28 y.o.   MRN: [id number] States headache is mostly resolved   But reports that during theheadache she had some funny feeling in [place] at [place], similar to lightheadedness, which has now resolved States often feels that way when she has a migraine   Has been evaluated in Neurology for this inpast.   Vitals:   07/29/23 1801 07/29/23 1831 07/29/23 1901 07/29/23 1930  BP: 112/65 122/80 129/76 126/79   07/29/23 2000 07/29/23 2030 07/29/23 2100 07/29/23 2130  BP: 119/67 (!) 105/54 117/63 137/85   07/29/23 2200 07/29/23 2230 07/29/23 2300 07/29/23 2312  BP: 126/82 121/78 123/70 110/72   Blood sugar within normal limits CBG (last 3)  Recent Labs    07/29/23 2317  GLUCAP 83

## 2023-07-29 NOTE — Progress Notes (Signed)
 Labor Progress Note Sandra Harding is a 28 y.o. [phone] at [redacted]w[redacted]d presented to MAU after SROM  S: Doing well, comfortable w epidural.  O:  BP 126/75   Pulse 79   Temp 98 F (36.7 C) (Oral)   Resp 16   LMP 10/26/2022   SpO2 99%  EFM: 130/mod/+a/-d  CVE: Dilation: 2.5 Effacement (%): 70 Cervical Position: Posterior Station: -2 Presentation: Vertex Exam by:: lee   A&P: 28 y.o. [id number] [redacted]w[redacted]d   #Labor: Continuing to uptitrate Pitocin  for regular ctx #Pain: Epidural #FWB: Cat I #GBS negative  #Hx C/S for fetal intolerance to labor f/b two successful VBACs  Alain Sor, MD 4:23 PM

## 2023-07-29 NOTE — MAU Note (Signed)
 Sandra Harding is a 28 y.o. at [redacted]w[redacted]d here in MAU reporting leaking of fluid. She reports a gush of fluid at 0245. Reports large amount and a slow leakage and clear fluid. Reports active fetal movement, occasional ctx's, and no VB. Pain 0/10. Denies PIH s/sx.    Onset of complaint: 0245 Pain score: 0/10 Vitals:   07/29/23 0440 07/29/23 0441  BP: (!) 140/101   Pulse:  79  Resp: 18   Temp: 97.8 F (36.6 C)   SpO2: 99%      FHT: 140bpm Lab orders placed from triage: none ordered

## 2023-07-30 ENCOUNTER — Encounter (HOSPITAL_COMMUNITY): Payer: Self-pay | Admitting: Obstetrics and Gynecology

## 2023-07-30 ENCOUNTER — Encounter: Admitting: Obstetrics and Gynecology

## 2023-07-30 DIAGNOSIS — O34211 Maternal care for low transverse scar from previous cesarean delivery: Secondary | ICD-10-CM | POA: Diagnosis not present

## 2023-07-30 DIAGNOSIS — O4202 Full-term premature rupture of membranes, onset of labor within 24 hours of rupture: Secondary | ICD-10-CM | POA: Diagnosis not present

## 2023-07-30 DIAGNOSIS — Z3A39 39 weeks gestation of pregnancy: Secondary | ICD-10-CM | POA: Diagnosis not present

## 2023-07-30 MED ORDER — COCONUT OIL OIL: 1.0000 | TOPICAL_OIL | Status: DC | PRN

## 2023-07-30 MED ORDER — PRENATAL MULTIVITAMIN CH
1.0000 | ORAL_TABLET | Freq: Every day | ORAL | Status: DC
  Administered 2023-07-30: 1 via ORAL
  Filled 2023-07-30: qty 1

## 2023-07-30 MED ORDER — ONDANSETRON HCL 4 MG PO TABS: 4.0000 mg | ORAL_TABLET | ORAL | Status: DC | PRN

## 2023-07-30 MED ORDER — SIMETHICONE 80 MG PO CHEW: 80.0000 mg | CHEWABLE_TABLET | ORAL | Status: DC | PRN

## 2023-07-30 MED ORDER — AMMONIA AROMATIC IN INHA
RESPIRATORY_TRACT | Status: AC
  Filled 2023-07-30: qty 10

## 2023-07-30 MED ORDER — IBUPROFEN 600 MG PO TABS
600.0000 mg | ORAL_TABLET | Freq: Four times a day (QID) | ORAL | Status: DC
  Administered 2023-07-30 – 2023-07-31 (×5): 600 mg via ORAL
  Filled 2023-07-30 (×5): qty 1

## 2023-07-30 MED ORDER — TETANUS-DIPHTH-ACELL PERTUSSIS 5-2.5-18.5 LF-MCG/0.5 IM SUSY: 0.5000 mL | PREFILLED_SYRINGE | Freq: Once | INTRAMUSCULAR | Status: DC

## 2023-07-30 MED ORDER — ONDANSETRON HCL 4 MG/2ML IJ SOLN: 4.0000 mg | INTRAMUSCULAR | Status: DC | PRN

## 2023-07-30 MED ORDER — ZOLPIDEM TARTRATE 5 MG PO TABS: 5.0000 mg | ORAL_TABLET | Freq: Every evening | ORAL | Status: DC | PRN

## 2023-07-30 MED ORDER — WITCH HAZEL-GLYCERIN EX PADS: 1.0000 | MEDICATED_PAD | CUTANEOUS | Status: DC | PRN

## 2023-07-30 MED ORDER — ACETAMINOPHEN 325 MG PO TABS: 650.0000 mg | ORAL_TABLET | ORAL | Status: DC | PRN

## 2023-07-30 MED ORDER — DIPHENHYDRAMINE HCL 25 MG PO CAPS: 25.0000 mg | ORAL_CAPSULE | Freq: Four times a day (QID) | ORAL | Status: DC | PRN

## 2023-07-30 MED ORDER — BENZOCAINE-MENTHOL 20-0.5 % EX AERO: 1.0000 | INHALATION_SPRAY | CUTANEOUS | Status: DC | PRN

## 2023-07-30 MED ORDER — DIBUCAINE (PERIANAL) 1 % EX OINT: 1.0000 | TOPICAL_OINTMENT | CUTANEOUS | Status: DC | PRN

## 2023-07-30 MED ORDER — SENNOSIDES-DOCUSATE SODIUM 8.6-50 MG PO TABS
2.0000 | ORAL_TABLET | ORAL | Status: DC
  Administered 2023-07-30: 2 via ORAL
  Filled 2023-07-30: qty 2

## 2023-07-30 NOTE — Discharge Summary (Addendum)
 Postpartum Discharge Summary  Date of Service updated***     Patient Name: Sandra Harding DOB: 1995-02-13 MRN: [id number]  Date of admission: 07/29/2023 Delivery date:07/30/2023 Delivering provider: TRUDY EARNIE CROME Date of discharge: 07/30/2023  Admitting diagnosis: Indication for care in labor or delivery [O75.9] Intrauterine pregnancy: [redacted]w[redacted]d     Secondary diagnosis:  Principal Problem:   Indication for care in labor or delivery  Additional problems: patent foramen ovale, hx C/S    Discharge diagnosis: VBAC                                              Post partum procedures:{Postpartum procedures:23558} Augmentation: Pitocin  Complications: None  Hospital course: Induction of Labor With Vaginal Delivery   28 y.o. yo H5E5995 at [redacted]w[redacted]d was admitted to the hospital 07/29/2023 for induction of labor.  Indication for induction: SROM.  Patient had an labor course complicated bynothing Membrane Rupture Time/Date: 2:45 AM,07/29/2023  Delivery Method:VBAC, Spontaneous Operative Delivery:N/A Episiotomy: None Lacerations:  2nd degree;Perineal Details of delivery can be found in separate delivery note.  Patient had a postpartum course complicated by***. Patient is discharged home 07/30/23.  Newborn Data: Birth date:07/30/2023 Birth time:2:13 AM Gender:Female Living status:Living Apgars:9 ,9  Weight:3500 g  Magnesium  Sulfate received: No BMZ received: No Rhophylac:No MMR:{MMR:30440033} T-DaP:{Tdap:23962} Flu: {Qol:76036} RSV Vaccine received: {RSV:31013} Transfusion:{Transfusion received:30440034} Immunizations administered: Immunization History  Administered Date(s) Administered   Influenza,inj,Quad PF,6+ Mos 10/31/2017, 10/24/2021   Tdap 08/18/2013, 10/20/2017, 02/27/2022, 05/26/2023    Physical exam  Vitals:   07/30/23 0345 07/30/23 0415 07/30/23 0430 07/30/23 0458  BP: 126/73     Pulse: 85     Resp:      Temp:      TempSrc:    Oral  SpO2:  99% 100%    General:  {Exam; general:21111117} Lochia: {Desc; appropriate/inappropriate:30686::appropriate} Uterine Fundus: {Desc; firm/soft:30687} Incision: {Exam; incision:21111123} DVT Evaluation: {Exam; dvt:2111122} Labs: Lab Results  Component Value Date   WBC 8.1 07/29/2023   HGB 11.9 (L) 07/29/2023   HCT 35.7 (L) 07/29/2023   MCV 92.7 07/29/2023   PLT 190 07/29/2023      Latest Ref Rng & Units 07/29/2023    5:31 AM  CMP  Glucose 70 - 99 mg/dL 94   BUN 6 - 20 mg/dL 9   Creatinine 9.55 - 8.99 mg/dL 9.29   Sodium 864 - 854 mmol/L 135   Potassium 3.5 - 5.1 mmol/L 3.6   Chloride 98 - 111 mmol/L 105   CO2 22 - 32 mmol/L 19   Calcium 8.9 - 10.3 mg/dL 89.7   Total Protein 6.5 - 8.1 g/dL 6.6   Total Bilirubin 0.0 - 1.2 mg/dL 0.5   Alkaline Phos 38 - 126 U/L 126   AST 15 - 41 U/L 22   ALT 0 - 44 U/L 14    Edinburgh Score:    06/11/2022    1:18 PM  Edinburgh Postnatal Depression Scale Screening Tool  I have been able to laugh and see the funny side of things. 0  I have looked forward with enjoyment to things. 0  I have been anxious or worried for no good reason. 2  I have felt scared or panicky for no good reason. 0  Things have been getting on top of me. 0  I have been so unhappy that I have had difficulty sleeping. 0  I have felt sad or miserable. 0  I have been so unhappy that I have been crying. 0  The thought of harming myself has occurred to me. 0      After visit meds:  Allergies as of 07/30/2023       Reactions   Sulfa Antibiotics Hives, Rash     Med Rec must be completed prior to using this [facility]***        Discharge home in stable condition Infant Feeding: Breast Infant Disposition:{CHL IP OB HOME WITH FNUYZM:76418} Discharge instruction: per After Visit Summary and Postpartum booklet. Activity: Advance as tolerated. Pelvic rest for 6 weeks.  Diet: routine diet Anticipated Birth Control: vasectomy Postpartum Appointment:{Outpatient follow up:23559} Additional  Postpartum F/U: {PP Procedure:23957} Future Appointments: Future Appointments  Date Time Provider Department Center  07/30/2023 10:30 AM Cleatus Moccasin, MD [facility]   Follow up Visit:      07/30/2023 Earnie Pouch, CNM

## 2023-07-30 NOTE — Anesthesia Postprocedure Evaluation (Signed)
 Anesthesia Post Note  Patient: Sandra Harding  Procedure(s) Performed: AN AD HOC LABOR EPIDURAL     Patient location during evaluation: Mother Baby Anesthesia Type: Epidural Level of consciousness: awake and alert and oriented Pain management: satisfactory to patient Vital Signs Assessment: post-procedure vital signs reviewed and stable Respiratory status: respiratory function stable Cardiovascular status: stable Postop Assessment: no headache, no backache, epidural receding, patient able to bend at knees, no signs of nausea or vomiting, adequate PO intake and able to ambulate Anesthetic complications: no   No notable events documented.  Last Vitals:  Vitals:   07/30/23 0611 07/30/23 0940  BP: 135/88 110/66  Pulse: 92 91  Resp: 18 17  Temp: 37.1 C 36.6 C  SpO2: 98% 98%    Last Pain:  Vitals:   07/30/23 1128  TempSrc:   PainSc: 2    Pain Goal:                   [name]

## 2023-07-30 NOTE — Lactation Note (Signed)
 This note was copied from a baby's chart. Lactation Consultation Note  Patient Name: Sandra Harding Today's Date: 07/30/2023 Age:30 hours Reason for consult: Initial assessment  P4, Assisted with latching in football hold. After 5 minutes, baby became spitty.  Large emesis. Reviewed basics.  Mother is experienced and will call if she needs further assistance.   Maternal Data Has patient been taught Hand Expression?: Yes Does the patient have breastfeeding experience prior to this delivery?: Yes How long did the patient breastfeed?: 5-6 weeks  Feeding Mother's Current Feeding Choice: Breast Milk  LATCH Score Latch: Grasps breast easily, tongue down, lips flanged, rhythmical sucking. (Simultaneous filing. User may not have seen previous data.)  Audible Swallowing: A few with stimulation  Type of Nipple: Everted at rest and after stimulation  Comfort (Breast/Nipple): Soft / non-tender  Hold (Positioning): Assistance needed to correctly position infant at breast and maintain latch.  LATCH Score: 8 (Simultaneous filing. User may not have seen previous data.)   Interventions Interventions: Breast feeding basics reviewed;Assisted with latch;Skin to skin;Hand express;Education;LC Services brochure;CDC milk storage guidelines  Discharge Pump: Personal;DEBP  Consult Status Consult Status: Follow-up Date: 07/31/23 Follow-up type: In-patient   Shannon Levorn Lemme  RN, IBCLC 07/30/2023, 9:01 AM

## 2023-07-30 NOTE — Plan of Care (Signed)
  Problem: Education: Goal: Knowledge of disease or condition will improve Outcome: Completed/Met Goal: Knowledge of the prescribed therapeutic regimen will improve Outcome: Completed/Met   

## 2023-07-31 MED ORDER — ACETAMINOPHEN 325 MG PO TABS: 650.0000 mg | ORAL_TABLET | ORAL | 0 refills | Status: AC | PRN

## 2023-07-31 MED ORDER — SENNOSIDES-DOCUSATE SODIUM 8.6-50 MG PO TABS: 2.0000 | ORAL_TABLET | ORAL | 0 refills | Status: AC

## 2023-07-31 MED ORDER — IBUPROFEN 600 MG PO TABS: 600.0000 mg | ORAL_TABLET | Freq: Four times a day (QID) | ORAL | 0 refills | Status: AC

## 2023-07-31 NOTE — Patient Instructions (Signed)
 If interested in an outpatient lactation consult in office or virtually please reach out to us  at [facility] (First Floor) [address] [place] Please call [phone] and press 4 for lactation.    Melodi Sprung, [facility] for [facility]

## 2023-08-12 ENCOUNTER — Telehealth (HOSPITAL_COMMUNITY): Payer: Self-pay

## 2023-08-12 NOTE — Telephone Encounter (Signed)
 08/12/2023 1341  Name: Sandra Harding MRN: [id number] DOB: April 15, 1995  Reason for Call:  Transition of Care Hospital Discharge Call  Contact Status: Patient Contact Status: Complete  Language assistant needed:          Follow-Up Questions: Do You Have Any Concerns About Your Health As You Heal From Delivery?: No Do You Have Any Concerns About Your Infants Health?: No  Edinburgh Postnatal Depression Scale:  In the Past 7 Days: I have been able to laugh and see the funny side of things.: As much as I always could I have looked forward with enjoyment to things.: As much as I ever did I have blamed myself unnecessarily when things went wrong.: No, never I have been anxious or worried for no good reason.: Hardly ever I have felt scared or panicky for no good reason.: No, not at all Things have been getting on top of me.: No, I have been coping as well as ever I have been so unhappy that I have had difficulty sleeping.: Not at all I have felt sad or miserable.: No, not at all I have been so unhappy that I have been crying.: No, never The thought of harming myself has occurred to me.: Never Edinburgh Postnatal Depression Scale Total: 1  PHQ2-9 Depression Scale:     Discharge Follow-up: Edinburgh score requires follow up?: No Patient was advised of the following resources:: Breastfeeding Support Group, Support Group  Post-discharge interventions: Reviewed Newborn Safe Sleep Practices  Signature  Rosaline Deretha PEAK

## 2023-09-10 ENCOUNTER — Ambulatory Visit: Admitting: Obstetrics and Gynecology

## 2023-09-10 ENCOUNTER — Other Ambulatory Visit (HOSPITAL_COMMUNITY)
Admission: RE | Admit: 2023-09-10 | Discharge: 2023-09-10 | Disposition: A | Discharge status: Home / Self Care | Source: Ambulatory Visit | Attending: Obstetrics and Gynecology | Admitting: Obstetrics and Gynecology

## 2023-09-10 ENCOUNTER — Encounter: Payer: Self-pay | Admitting: Obstetrics and Gynecology

## 2023-09-10 DIAGNOSIS — N898 Other specified noninflammatory disorders of vagina: Secondary | ICD-10-CM | POA: Insufficient documentation

## 2023-09-10 DIAGNOSIS — Z124 Encounter for screening for malignant neoplasm of cervix: Secondary | ICD-10-CM

## 2023-09-10 NOTE — Progress Notes (Signed)
 Post Partum Visit Note  Sandra Harding is a 28 y.o. [phone] female who presents for a postpartum visit. She is 6 weeks postpartum following a VBAC.  I have fully reviewed the prenatal and intrapartum course. The delivery was at 39 gestational weeks and 4 days.  Anesthesia: epidural. Postpartum course has been good. Baby is doing well. Baby is feeding by bottle - Similac Total Comfort. Bleeding no bleeding. Bowel function is normal. Bladder function is normal. Patient is sexually active. Contraception method is vasectomy. Postpartum depression screening: negative.  - Had intercourse and noted a tender area. Similar thing happened with her 2nd child and she ultimately needed to go to PFPT - some UI, needs to cross legs when she coughs/laughs - increased clear vaginal discharge, needs to wear liner, no significant odor   Upstream - 09/10/23 1147       Pregnancy Intention Screening   Does the patient want to become pregnant in the next year? No    Does the patient's partner want to become pregnant in the next year? No    Would the patient like to discuss contraceptive options today? No      Contraception Wrap Up   Current Method Female Condom;Vasectomy    End Method Female Condom;Vasectomy    Contraception Counseling Provided No    How was the end contraceptive method provided? N/A         The pregnancy intention screening data noted above was reviewed. Potential methods of contraception were discussed. The patient elected to proceed with Female Condom; Vasectomy.   Edinburgh Postnatal Depression Scale - 09/10/23 1119       Edinburgh Postnatal Depression Scale:  In the Past 7 Days   I have been able to laugh and see the funny side of things. 0    I have looked forward with enjoyment to things. 0    I have blamed myself unnecessarily when things went wrong. 0    I have been anxious or worried for no good reason. 0    I have felt scared or panicky for no good reason. 0    Things have  been getting on top of me. 0    I have been so unhappy that I have had difficulty sleeping. 0    I have felt sad or miserable. 0    I have been so unhappy that I have been crying. 1    The thought of harming myself has occurred to me. 0    Edinburgh Postnatal Depression Scale Total 1          Health Maintenance Due  Topic Date Due   Hepatitis B Vaccines 19-59 Average Risk (1 of 3 - 19+ 3-dose series) Never done   HPV VACCINES (1 - 3-dose SCDM series) Never done   COVID-19 Vaccine (1 - 2024-25 season) Never done   Cervical Cancer Screening (HPV/Pap Cotest)  08/04/2023   INFLUENZA VACCINE  08/22/2023    The following portions of the patient's history were reviewed and updated as appropriate: allergies, current medications, past family history, past medical history, past social history, past surgical history, and problem list.  Review of Systems Pertinent items are noted in HPI.  Objective:  Ht 5' 2 (1.575 m)   Wt 204 lb (92.5 kg)   LMP 10/26/2022   Breastfeeding No   BMI 37.31 kg/m    General:  alert, cooperative, and no distress   Breasts:  not indicated  Lungs: Normal effort  Heart:  Normal rate  Abdomen: Soft, non tender  GU exam:  Small pinpoint area x2 of granulation tissue on perineum. Otherwise NEFG, no residual suture, cervix normal. Cotest collected       Assessment:   Encounter for routine postpartum follow-up - doing well; discussed RTC for silvar nitrate if tender spot persists after 2 weeks  Cervical cancer screening - pap collected  Vaginal discharge - testing for BV/yeast sent  Plan:   Essential components of care per ACOG recommendations:  1.  Mood and well being: Patient with negative depression screening today. Reviewed local resources for support.  - Patient tobacco use? No.   - hx of drug use? No.    2. Infant care and feeding:  -Patient currently breastmilk feeding? No.  -Social determinants of health (SDOH) reviewed in EPIC. No  concerns  3. Sexuality, contraception and birth spacing - Patient does not want a pregnancy in the next year.  Desired family size is 4 children.  - Reviewed reproductive life planning. Reviewed contraceptive methods based on pt preferences and effectiveness.  Patient desired Female Condom and Vasectomy today.    4. Sleep and fatigue -Encouraged family/partner/community support of 4 hrs of uninterrupted sleep to help with mood and fatigue  5. Physical Recovery  - Discussed patients delivery and complications. She describes her labor as good. - Patient had a Vaginal, no problems at delivery. Patient had a 2nd degree laceration. Perineal healing reviewed. Patient expressed understanding - Patient has urinary incontinence? Yes. Offered PT and patient declined. Scheduling concerns - Patient is safe to resume physical and sexual activity  6.  Health Maintenance - HM due items addressed Yes - Last pap smear  Diagnosis  Date Value Ref Range Status  08/03/2020   Final   - Negative for Intraepithelial Lesions or Malignancy (NILM)  08/03/2020 - Benign reactive/reparative changes  Final   Pap smear done at today's visit.  -Breast Cancer screening indicated? No.   7. Chronic Disease/Pregnancy Condition follow up: None  Kieth JAYSON Carolin, MD Center for [employer], [facility]

## 2023-09-11 ENCOUNTER — Ambulatory Visit: Payer: Self-pay | Admitting: Obstetrics and Gynecology

## 2023-09-11 LAB — CERVICOVAGINAL ANCILLARY ONLY
Bacterial Vaginitis (gardnerella): POSITIVE — AB
Candida Glabrata: NEGATIVE
Candida Vaginitis: NEGATIVE
Comment: NEGATIVE
Comment: NEGATIVE
Comment: NEGATIVE

## 2023-09-11 MED ORDER — METRONIDAZOLE 0.75 % VA GEL: 1.0000 | Freq: Every day | VAGINAL | 0 refills | Status: AC

## 2023-09-15 LAB — CYTOLOGY - PAP
Comment: NEGATIVE
Diagnosis: NEGATIVE
Diagnosis: REACTIVE
High risk HPV: NEGATIVE
